# Patient Record
Sex: Female | Born: 1980 | Race: Black or African American | Hispanic: No | Marital: Married | Smoking: Former smoker
Health system: Southern US, Community
[De-identification: ages and names within clinical notes are randomized; demographics above are authoritative.]

## PROBLEM LIST (undated history)

## (undated) DIAGNOSIS — F32A Depression, unspecified: Secondary | ICD-10-CM

## (undated) DIAGNOSIS — L309 Dermatitis, unspecified: Secondary | ICD-10-CM

## (undated) DIAGNOSIS — I2699 Other pulmonary embolism without acute cor pulmonale: Secondary | ICD-10-CM

## (undated) DIAGNOSIS — T783XXA Angioneurotic edema, initial encounter: Secondary | ICD-10-CM

## (undated) DIAGNOSIS — L509 Urticaria, unspecified: Secondary | ICD-10-CM

## (undated) DIAGNOSIS — Z889 Allergy status to unspecified drugs, medicaments and biological substances status: Secondary | ICD-10-CM

## (undated) DIAGNOSIS — R51 Headache: Secondary | ICD-10-CM

## (undated) DIAGNOSIS — I10 Essential (primary) hypertension: Secondary | ICD-10-CM

## (undated) DIAGNOSIS — F319 Bipolar disorder, unspecified: Secondary | ICD-10-CM

## (undated) DIAGNOSIS — D649 Anemia, unspecified: Secondary | ICD-10-CM

## (undated) DIAGNOSIS — R87629 Unspecified abnormal cytological findings in specimens from vagina: Secondary | ICD-10-CM

## (undated) DIAGNOSIS — R519 Headache, unspecified: Secondary | ICD-10-CM

## (undated) DIAGNOSIS — K219 Gastro-esophageal reflux disease without esophagitis: Secondary | ICD-10-CM

## (undated) DIAGNOSIS — F329 Major depressive disorder, single episode, unspecified: Secondary | ICD-10-CM

## (undated) HISTORY — DX: Angioneurotic edema, initial encounter: T78.3XXA

## (undated) HISTORY — DX: Essential (primary) hypertension: I10

## (undated) HISTORY — DX: Headache: R51

## (undated) HISTORY — PX: ABDOMINAL HYSTERECTOMY: SUR658

## (undated) HISTORY — DX: Allergy status to unspecified drugs, medicaments and biological substances: Z88.9

## (undated) HISTORY — PX: BREAST SURGERY: SHX581

## (undated) HISTORY — DX: Headache, unspecified: R51.9

## (undated) HISTORY — DX: Dermatitis, unspecified: L30.9

## (undated) HISTORY — DX: Urticaria, unspecified: L50.9

---

## 2007-07-21 ENCOUNTER — Inpatient Hospital Stay (HOSPITAL_COMMUNITY): Admission: AD | Admit: 2007-07-21 | Discharge: 2007-07-22 | Payer: Self-pay | Admitting: Obstetrics & Gynecology

## 2007-08-12 ENCOUNTER — Emergency Department (HOSPITAL_COMMUNITY): Admission: EM | Admit: 2007-08-12 | Discharge: 2007-08-12 | Payer: Self-pay | Admitting: Emergency Medicine

## 2007-10-08 ENCOUNTER — Inpatient Hospital Stay (HOSPITAL_COMMUNITY): Admission: AD | Admit: 2007-10-08 | Discharge: 2007-10-08 | Payer: Self-pay | Admitting: Obstetrics and Gynecology

## 2007-12-14 ENCOUNTER — Inpatient Hospital Stay: Admission: AD | Admit: 2007-12-14 | Discharge: 2007-12-14 | Payer: Self-pay | Admitting: Obstetrics and Gynecology

## 2008-02-26 ENCOUNTER — Inpatient Hospital Stay (HOSPITAL_COMMUNITY): Admission: AD | Admit: 2008-02-26 | Discharge: 2008-03-01 | Payer: Self-pay | Admitting: Obstetrics and Gynecology

## 2008-02-26 ENCOUNTER — Encounter (INDEPENDENT_AMBULATORY_CARE_PROVIDER_SITE_OTHER): Payer: Self-pay | Admitting: Obstetrics and Gynecology

## 2010-06-25 ENCOUNTER — Encounter: Admission: RE | Admit: 2010-06-25 | Discharge: 2010-06-25 | Payer: Self-pay | Admitting: Family Medicine

## 2010-07-24 ENCOUNTER — Observation Stay (HOSPITAL_COMMUNITY): Admission: EM | Admit: 2010-07-24 | Discharge: 2010-07-25 | Payer: Self-pay | Admitting: Emergency Medicine

## 2010-07-25 ENCOUNTER — Ambulatory Visit: Payer: Self-pay | Admitting: Vascular Surgery

## 2011-02-07 ENCOUNTER — Emergency Department (HOSPITAL_COMMUNITY)
Admission: EM | Admit: 2011-02-07 | Discharge: 2011-02-08 | Disposition: A | Discharge status: Home / Self Care | Payer: 59 | Attending: Emergency Medicine | Admitting: Emergency Medicine

## 2011-02-07 DIAGNOSIS — R079 Chest pain, unspecified: Secondary | ICD-10-CM | POA: Insufficient documentation

## 2011-02-25 LAB — CBC
MCV: 91.5 fL (ref 78.0–100.0)
Platelets: 208 10*3/uL (ref 150–400)
RDW: 14 % (ref 11.5–15.5)

## 2011-02-25 LAB — URINALYSIS, ROUTINE W REFLEX MICROSCOPIC
Bilirubin Urine: NEGATIVE
Glucose, UA: NEGATIVE mg/dL
Hgb urine dipstick: NEGATIVE
Protein, ur: NEGATIVE mg/dL
Specific Gravity, Urine: 1.026 (ref 1.005–1.030)
Urobilinogen, UA: 1 mg/dL (ref 0.0–1.0)

## 2011-02-25 LAB — BASIC METABOLIC PANEL
Calcium: 9.2 mg/dL (ref 8.4–10.5)
Chloride: 107 mEq/L (ref 96–112)
Creatinine, Ser: 0.62 mg/dL (ref 0.4–1.2)
GFR calc Af Amer: >60 mL/min (ref >60)
GFR calc non Af Amer: >60 mL/min (ref >60)
Glucose, Bld: 94 mg/dL (ref 70–99)
Potassium: 3.6 mEq/L (ref 3.5–5.1)

## 2011-02-25 LAB — DIFFERENTIAL
Basophils Relative: 1 % (ref 0–1)
Eosinophils Absolute: 0.3 10*3/uL (ref 0.0–0.7)
Lymphocytes Relative: 39 % (ref 12–46)
Monocytes Absolute: 0.4 10*3/uL (ref 0.1–1.0)
Neutro Abs: 2.5 10*3/uL (ref 1.7–7.7)

## 2011-02-25 LAB — URINE MICROSCOPIC-ADD ON

## 2011-02-25 LAB — POCT CARDIAC MARKERS: Troponin i, poc: <0.05 ng/mL (ref 0.00–0.09)

## 2011-02-25 LAB — CK TOTAL AND CKMB (NOT AT ARMC): CK, MB: 0.5 ng/mL (ref 0.3–4.0)

## 2011-02-25 LAB — TROPONIN I: Troponin I: 0.01 ng/mL (ref 0.00–0.06)

## 2011-02-25 LAB — HEMOGLOBIN A1C: Mean Plasma Glucose: 105 mg/dL (ref <117)

## 2011-04-26 NOTE — Op Note (Signed)
NAME:  Danielle Estes, DONN NO.:  [id number]   MEDICAL RECORD NO.:  [id number]          PATIENT TYPE:  INP   LOCATION:  9134                          FACILITY:  [facility]   PHYSICIAN:  Guy Sandifer. Henderson Cloud, M.D. DATE OF BIRTH:  07-22-1981   DATE OF PROCEDURE:  02/27/2008  DATE OF DISCHARGE:                               OPERATIVE REPORT   PREOPERATIVE DIAGNOSES:  1. Intrauterine pregnancy at 63 and 6/7 weeks estimated additional      age.  2. Fetal distress.   POSTOPERATIVE DIAGNOSES:  1. Intrauterine pregnancy at 28 and 6/7 weeks estimated additional      age.  2. Fetal distress.   PROCEDURE:  Emergent low transverse cesarean section.   SURGEON:  Guy Sandifer. Henderson Cloud, M.D.   ANESTHESIA:  General endotracheal intubation.  Burnett Corrente, M.D.   ESTIMATED BLOOD LOSS:  One thousand mL.   FINDINGS:  Viable female infant with Apgars of 8 and 9 at one and five  minutes respectively.  Arterial cord pH 7.28.  Birth weight 6 pounds 9  ounces.   INDICATIONS AND CONSENT:  This patient is a 30 year old African American  female G3, P2 with an EDC of March 06, 2008, who presented to triage  through the night and was found to have a fetal deceleration that  recovered.  She was admitted for induction of labor in view of the  observed deceleration.  Blood pressure on admission was 140s/90s.  PIH  labs were normal.  Cervix is 3 cm dilated, 50% effaced, -2 station.  Artificial rupture of membranes for clear fluid was carried out at 5  a.m. on February 27, 2008.  Fetal scalp electrode was placed.  The patient  was becoming more uncomfortable.  Blood pressures were 130s-140s/80s-  90s.  The patient denied headache, blurry vision, epigastric pain.  On  examination deep tendon reflexes were 2+ without clonus and she had no  epigastric tenderness.  At approximately 8 a.m. the baby was noted to  have had two decelerations variable in shape with contractions that went  down to the 30s-40s with  recovery.  Pitocin was discontinued, oxygen was  administered.  She was given an IV fluid bolus and changed position.  Cervix was 4 cm dilated, 90% effaced, -2 station at that time.  An IUPC  was placed.  While she was supine for the IUPC placement heartbeat went  down to the 30s again and recovered with rolling the patient to her  side.  At that point I was discussing the deep decelerations with the  patient and the father of the baby.  We discussed our plan of  amnioinfusion and a possible cord compression creating the heart rate  pattern.  During that discussion the patient had another contraction,  the heartbeat went to the 30s-40s again.  At that time I called for an  emergency C-section.  The heartbeat did recover while in the room but we  proceeded with our planned emergency C-section.  I discussed with the  patient and the father of the baby the cesarean section and reviewed the  risks  and benefits.  All questions were answered.   PROCEDURE IN DETAIL:  The patient was taken to the operating room where  the heartbeat was in the 110s.  Dr. Harvest Forest was attempting placement of  the spinal anesthetic.  The patient had a contraction followed by a  deceleration.  At that point we were unable to locate the fetal  heartbeat with the handheld Doppler unit that was being used.  Because  of this the decision was made to proceed with general anesthesia.  The  patient was then prepped, Foley catheter was placed and she was draped.  After general anesthesia was secured with endotracheal intubation a  Pfannenstiel incision was made.  Dissection was carried out in layers to  the peritoneum.  The peritoneum was incised and extended bluntly.  The  vesicouterine peritoneum was taken down cephalad laterally.  The bladder  flap was developed.  The bladder blade was placed.  The uterus was  entered in a low transverse manner and the uterine cavity was entered  bluntly with a hemostat.  The uterine  incision was extended cephalad  laterally with fingers.  The baby was in the left occiput posterior  position.  The vertex was delivered without difficulty.  While  delivering the baby a cord wrapped around the neck and the shoulder was  noted.  The baby was delivered and good cry and tone was noted.  The  cord was clamped and cut and the baby was handed to the awaiting  pediatrics team.  While drawing the cord blood and cord gases the cord  was noted to have very little Wharton's jelly.  The placenta was then  removed and sent to pathology.  The uterus was exteriorized.  The  uterine cavity was cleaned.  The uterus was closed in two running  locking imbricating layers of zero Monocryl suture.  A small bleeder in  the center of the incision was controlled with a 2-0 chromic suture.  The tubes and ovaries were normal bilaterally.  Prior to the procedure  while in the patient's room the patient and I had discussed her request  for tubal ligation.  Due to the emergent nature of the delivery the  decision was made to hold on the tubal ligation at this time.  The  uterus was then returned to the abdomen.  Copious irrigation was carried  out.  Good hemostasis was still noted.  Anterior peritoneum was closed  in running fashion with zero Monocryl suture which was also used to  reapproximate the pyramidalis muscle in the midline.  Anterior rectus  fascia was closed in running fashion with zero PDS suture and the skin  was closed with clips.  All sponge, instrument and needle counts were  correct and the patient was transferred to the recovery room in stable  condition.      Guy Sandifer Henderson Cloud, M.D.  Electronically Signed     JET/MEDQ  D:  02/27/2008  T:  02/27/2008  Job:  [id number]

## 2011-04-29 NOTE — Discharge Summary (Signed)
NAME:  Danielle Estes, BETSCH NO.:  [id number]   MEDICAL RECORD NO.:  [id number]          PATIENT TYPE:  INP   LOCATION:  9134                          FACILITY:  [facility]   PHYSICIAN:  Freddy Finner, M.D.   DATE OF BIRTH:  September 17, 1981   DATE OF ADMISSION:  02/27/2008  DATE OF DISCHARGE:  03/01/2008                               DISCHARGE SUMMARY   ADMITTING DIAGNOSES:  1. Intrauterine pregnancy at 59 weeks estimated gestational age.  2. Induction of labor secondary to nonreassuring fetal heart tones.   DISCHARGE DIAGNOSES:  1. Status post low-transverse cesarean section secondary to      nonreassuring fetal heart tones.  2. Viable female infant.   PROCEDURE:  Primary low-transverse cesarean section.   REASON FOR ADMISSION:  Please see written H&P.   HOSPITAL COURSE:  The patient is a 30 year old gravida 3, para 2 that  presented to [facility] at 39 weeks estimated gestational age for  complaints of contractions.  Upon presentation, no evidence of labor was  observed.  However, the fetal heart tones were nonreactive.  Biophysical  profile was performed, which revealed score of  8/8.  Once the patient  returned from the ultrasound, was placed back on the monitor.  There was  a fetal heart deceleration, which lasted for approximately 30 seconds.  It did have spontaneous recovery and now with reassuring fetal heart  tone pattern with acceleration, the patient was now admitted for an  induction of labor.  Blood pressure was noted to be in the 140s/90s.  Cervix was examined and found to be 3 cm dilated, 50% effaced vertex at  -2 station.  Artificial rupture of membranes was performed, which  revealed clear fluid and fetal scalp electrode was placed.  The patient  was started on some low-dose Pitocin.  PIH labs were drawn.  Shortly,  after starting the Pitocin, the baby was noted to have 2 further  decelerations that were variable in shape.  Pitocin was discontinued.  Oxygen was administered, and the patient was given IV bolus of fluid and  position was changed.  Cervix was reexamined and found now to be 4 cm  dilated, 90% effaced, at -2 station.  Intrauterine pressure catheter was  placed and while the patient was in the supine position for place and  the heartbeat again went down into the 30s which did recover while  rolling the patient back to her side.  Because of the continued deep  decelerations that continued to be observed, a decision was made to  proceed with a cesarean delivery.  The patient was then taken urgently  to the operating room where general anesthesia was administered.  A low-  transverse incision was made with delivery of a viable female infant  weighing 6 pounds 9 ounces, Apgars of 8 at 1 minute and 9 at 5 minutes.  Arterial cord pH was 7.28.  The patient tolerated the procedure well and  was taken to the recovery room in stable condition.  On postoperative  day #1, the patient was without complaint.  Vital signs were stable.  Blood  pressure was now 140/100.  Abdomen was soft with good return of  bowel function.  Fundus was firm and nontender.  Abdominal dressing was  noted to be clean, dry, and intact.  Laboratory findings revealed  hemoglobin of 9.8, platelet count of 118,000.  On the following morning,  the patient denied headache, blurred vision, or right upper quadrant  pain.  Vital signs were stable.  Blood pressure was 120/89 to 132/80.  Abdomen was soft.  Fundus was firm and nontender.  Abdominal dressing  had been removed revealing an incision that was clean, dry and intact.  Laboratory findings revealed hemoglobin stable at 7.8, platelet count of  185,000.  On postoperative day #3, the patient was without complaint.  Vital signs remained stable.  She is afebrile.  Fundus firm and  nontender.  Incision was clean, dry, and intact.  Staples removed, and  the patient was later discharged home.   CONDITION ON DISCHARGE:   Stable.   DIET:  Regular as tolerated.   ACTIVITY:  No heavy lifting, no driving x2 weeks, no vaginal entry.   FOLLOW UP:  The patient is to follow up in the office in 1-2 weeks for  an incision check.  She is to call up for temperature greater than 100  degrees, persistent nausea, vomiting, heavy vaginal bleeding and/or  redness or drainage from incisional site.  The patient was also  encouraged to call for headache, blurred vision, or right upper quadrant  pain.   DISCHARGE MEDICATIONS:  1. Percocet 5/325 #30 1 p.o. q.4-6 h. p.r.n.  2. Motrin 600 mg q.6 h.  3. Prenatal vitamins 1 p.o. daily.  4. Colace 1 p.o. daily p.r.n.      Julio Sicks, N.P.      Freddy Finner, M.D.  Electronically Signed    CC/MEDQ  D:  04/01/2008  T:  04/02/2008  Job:  [id number]

## 2011-07-28 ENCOUNTER — Emergency Department (HOSPITAL_COMMUNITY)
Admission: EM | Admit: 2011-07-28 | Discharge: 2011-07-29 | Disposition: A | Discharge status: Home / Self Care | Payer: 59 | Attending: Emergency Medicine | Admitting: Emergency Medicine

## 2011-07-28 ENCOUNTER — Emergency Department (HOSPITAL_COMMUNITY): Payer: 59

## 2011-07-28 DIAGNOSIS — R071 Chest pain on breathing: Secondary | ICD-10-CM | POA: Insufficient documentation

## 2011-07-28 DIAGNOSIS — R0602 Shortness of breath: Secondary | ICD-10-CM | POA: Insufficient documentation

## 2011-07-28 LAB — CK TOTAL AND CKMB (NOT AT ARMC): Relative Index: 1.1 (ref 0.0–2.5)

## 2011-07-28 LAB — CBC
Hemoglobin: 15 g/dL (ref 12.0–15.0)
MCV: 89.7 fL (ref 78.0–100.0)
Platelets: 223 10*3/uL (ref 150–400)
RBC: 4.84 MIL/uL (ref 3.87–5.11)
WBC: 4.3 10*3/uL (ref 4.0–10.5)

## 2011-07-28 LAB — DIFFERENTIAL
Eosinophils Absolute: 0.4 10*3/uL (ref 0.0–0.7)
Eosinophils Relative: 9 % — ABNORMAL HIGH (ref 0–5)
Lymphocytes Relative: 48 % — ABNORMAL HIGH (ref 12–46)
Lymphs Abs: 2 10*3/uL (ref 0.7–4.0)
Monocytes Absolute: 0.4 10*3/uL (ref 0.1–1.0)

## 2011-07-28 LAB — COMPREHENSIVE METABOLIC PANEL
Albumin: 4.4 g/dL (ref 3.5–5.2)
Alkaline Phosphatase: 80 U/L (ref 39–117)
BUN: 8 mg/dL (ref 6–23)
Calcium: 9.9 mg/dL (ref 8.4–10.5)
Chloride: 105 mEq/L (ref 96–112)
GFR calc Af Amer: >60 mL/min (ref >60)
GFR calc non Af Amer: >60 mL/min (ref >60)
Glucose, Bld: 84 mg/dL (ref 70–99)
Potassium: 3.7 mEq/L (ref 3.5–5.1)
Sodium: 139 mEq/L (ref 135–145)
Total Bilirubin: 0.4 mg/dL (ref 0.3–1.2)

## 2011-07-28 LAB — POCT I-STAT TROPONIN I

## 2011-07-29 LAB — POCT I-STAT TROPONIN I

## 2011-09-01 LAB — URINALYSIS, ROUTINE W REFLEX MICROSCOPIC
Glucose, UA: NEGATIVE
Ketones, ur: NEGATIVE
Nitrite: NEGATIVE
Specific Gravity, Urine: <1.005 — ABNORMAL LOW
pH: 7

## 2011-09-01 LAB — URINE MICROSCOPIC-ADD ON

## 2011-09-05 LAB — CBC
HCT: 24.3 — ABNORMAL LOW
HCT: 28.1 — ABNORMAL LOW
MCHC: 31.9
MCHC: 32.2
MCV: 78.2
MCV: 78.5
Platelets: 204
Platelets: 207
RBC: 3.11 — ABNORMAL LOW
RDW: 17.2 — ABNORMAL HIGH
WBC: 12.8 — ABNORMAL HIGH
WBC: 6.8
WBC: 8.8

## 2011-09-05 LAB — COMPREHENSIVE METABOLIC PANEL
ALT: 11
Albumin: 2.3 — ABNORMAL LOW
Albumin: 2.7 — ABNORMAL LOW
Alkaline Phosphatase: 211 — ABNORMAL HIGH
BUN: 3 — ABNORMAL LOW
CO2: 21
Calcium: 8.5
Calcium: 9.3
Chloride: 109
Creatinine, Ser: 0.48
GFR calc Af Amer: >60
GFR calc Af Amer: >60
GFR calc non Af Amer: >60
Glucose, Bld: 83
Potassium: 4
Sodium: 137
Total Bilirubin: 0.7
Total Protein: 5.4 — ABNORMAL LOW

## 2011-09-05 LAB — DIFFERENTIAL
Lymphocytes Relative: 9 — ABNORMAL LOW
Monocytes Relative: 8
Neutrophils Relative %: 83 — ABNORMAL HIGH

## 2011-09-05 LAB — RPR: RPR Ser Ql: NONREACTIVE

## 2011-09-26 LAB — URINALYSIS, ROUTINE W REFLEX MICROSCOPIC
Bilirubin Urine: NEGATIVE
Glucose, UA: 100 — AB
Ketones, ur: 15 — AB
pH: 6

## 2011-09-26 LAB — URINE CULTURE: Colony Count: >=100000

## 2011-09-26 LAB — CBC
MCV: 91.1
Platelets: 268
WBC: 6.2

## 2011-09-26 LAB — HCG, QUANTITATIVE, PREGNANCY: hCG, Beta Chain, Quant, S: 58870 — ABNORMAL HIGH

## 2011-09-26 LAB — URINE MICROSCOPIC-ADD ON

## 2011-09-26 LAB — POCT PREGNANCY, URINE
Operator id: 134401
Preg Test, Ur: POSITIVE

## 2011-09-26 LAB — WET PREP, GENITAL: Trich, Wet Prep: NONE SEEN

## 2011-09-26 LAB — GC/CHLAMYDIA PROBE AMP, GENITAL: GC Probe Amp, Genital: NEGATIVE

## 2014-02-10 ENCOUNTER — Encounter: Payer: 59 | Admitting: Obstetrics & Gynecology

## 2014-10-13 ENCOUNTER — Other Ambulatory Visit (HOSPITAL_COMMUNITY): Payer: Self-pay | Admitting: Obstetrics

## 2014-10-13 DIAGNOSIS — O09521 Supervision of elderly multigravida, first trimester: Secondary | ICD-10-CM

## 2014-10-13 DIAGNOSIS — O26841 Uterine size-date discrepancy, first trimester: Secondary | ICD-10-CM

## 2014-10-13 DIAGNOSIS — O131 Gestational [pregnancy-induced] hypertension without significant proteinuria, first trimester: Secondary | ICD-10-CM

## 2014-10-13 DIAGNOSIS — Z3A14 14 weeks gestation of pregnancy: Secondary | ICD-10-CM

## 2014-10-13 LAB — OB RESULTS CONSOLE GC/CHLAMYDIA
CHLAMYDIA, DNA PROBE: NEGATIVE
CHLAMYDIA, DNA PROBE: NEGATIVE
Chlamydia: NEGATIVE
Gonorrhea: NEGATIVE
Gonorrhea: NEGATIVE
Gonorrhea: NEGATIVE

## 2014-10-13 LAB — OB RESULTS CONSOLE ANTIBODY SCREEN: ANTIBODY SCREEN: NEGATIVE

## 2014-10-13 LAB — OB RESULTS CONSOLE HEPATITIS B SURFACE ANTIGEN: Hepatitis B Surface Ag: NEGATIVE

## 2014-10-13 LAB — OB RESULTS CONSOLE HGB/HCT, BLOOD
HCT: 39 %
Hemoglobin: 13.2 g/dL

## 2014-10-13 LAB — OB RESULTS CONSOLE ABO/RH: RH Type: POSITIVE

## 2014-10-13 LAB — OB RESULTS CONSOLE HIV ANTIBODY (ROUTINE TESTING): HIV: NONREACTIVE

## 2014-10-13 LAB — OB RESULTS CONSOLE RUBELLA ANTIBODY, IGM: Rubella: IMMUNE

## 2014-10-13 LAB — OB RESULTS CONSOLE PLATELET COUNT: PLATELETS: 289 10*3/uL

## 2014-10-13 LAB — OB RESULTS CONSOLE RPR: RPR: NONREACTIVE

## 2014-10-13 LAB — SICKLE CELL SCREEN: Sickle Cell Screen: NEGATIVE

## 2014-10-15 ENCOUNTER — Ambulatory Visit (HOSPITAL_COMMUNITY)
Admission: RE | Admit: 2014-10-15 | Discharge: 2014-10-15 | Disposition: A | Discharge status: Home / Self Care | Payer: Medicaid Other | Source: Ambulatory Visit | Attending: Obstetrics | Admitting: Obstetrics

## 2014-10-15 ENCOUNTER — Other Ambulatory Visit (HOSPITAL_COMMUNITY): Payer: Self-pay | Admitting: Obstetrics

## 2014-10-15 ENCOUNTER — Encounter (HOSPITAL_COMMUNITY): Payer: Self-pay

## 2014-10-15 DIAGNOSIS — Z3682 Encounter for antenatal screening for nuchal translucency: Secondary | ICD-10-CM | POA: Insufficient documentation

## 2014-10-15 DIAGNOSIS — Z315 Encounter for genetic counseling: Secondary | ICD-10-CM | POA: Insufficient documentation

## 2014-10-15 DIAGNOSIS — Z3A12 12 weeks gestation of pregnancy: Secondary | ICD-10-CM | POA: Insufficient documentation

## 2014-10-15 DIAGNOSIS — O131 Gestational [pregnancy-induced] hypertension without significant proteinuria, first trimester: Secondary | ICD-10-CM

## 2014-10-15 DIAGNOSIS — Z3A14 14 weeks gestation of pregnancy: Secondary | ICD-10-CM

## 2014-10-15 DIAGNOSIS — O09521 Supervision of elderly multigravida, first trimester: Secondary | ICD-10-CM

## 2014-10-15 DIAGNOSIS — O09529 Supervision of elderly multigravida, unspecified trimester: Secondary | ICD-10-CM | POA: Insufficient documentation

## 2014-10-15 DIAGNOSIS — O26841 Uterine size-date discrepancy, first trimester: Secondary | ICD-10-CM

## 2014-10-15 DIAGNOSIS — O26849 Uterine size-date discrepancy, unspecified trimester: Secondary | ICD-10-CM | POA: Insufficient documentation

## 2014-10-15 NOTE — Progress Notes (Signed)
Appointment Date: 10/15/2014 DOB: 05-14-1981 Referring Provider: Kathreen CosierMarshall, Bernard A, MD Attending: Dr. Particia NearingMartha Decker  Mrs. Kendel D Grzesiak and her husband, Riley Lamdrick Gratz, were seen for genetic counseling because of a maternal age of 33 y.o.Marland Kitchen.     They were counseled regarding maternal age and the association with risk for chromosome conditions due to nondisjunction.   We reviewed chromosomes, nondisjunction, and the associated 1 in 114 risk for fetal aneuploidy related to a maternal age of 33 y.o. at 6912 weeks gestation gestation.  They were counseled that the risk for aneuploidy decreases as gestational age increases, accounting for those pregnancies which spontaneously abort.  We specifically discussed Down syndrome (trisomy 6121), trisomies 3113 and 118, and sex chromosome aneuploidies (47,XXX and 47,XXY) including the common features and prognoses of each.   We reviewed available screening options including First Screen, noninvasive prenatal screening (NIPS)/cell free DNA (cfDNA) testing, and detailed ultrasound.  They were counseled that screening tests are used to modify a patient's a priori risk for aneuploidy, typically based on age. This estimate provides a pregnancy specific risk assessment. We reviewed the benefits and limitations of each option. Specifically, we discussed the conditions for which each test screens, the detection rates, and false positive rates of each. They were also counseled regarding diagnostic testing via amniocentesis. We reviewed the approximate 1 in 300-500 risk for complications from amniocentesis, including spontaneous pregnancy loss. After consideration of all the options, they elected to proceed with NIPS.  Those results will be available in 8-10 days.    Mrs. Sisneros was provided with written information regarding sickle cell anemia (SCA) including the carrier frequency and incidence in the African-American population, the availability of carrier testing and prenatal  diagnosis if indicated.  In addition, we discussed that hemoglobinopathies are routinely screened for as part of the [place] newborn screening panel.  She elected hemoglobin electrophoresis today.  Both family histories were reviewed and found to be noncontributory for birth defects, intellectual disability, and known genetic conditions. Without further information regarding the provided family history, an accurate genetic risk cannot be calculated. Further genetic counseling is warranted if more information is obtained.  Mrs. Seevers denied exposure to environmental toxins or chemical agents. She denied the use of alcohol, tobacco or street drugs. She denied significant viral illnesses during the course of her pregnancy. Her medical and surgical histories were noncontributory.   I counseled this couple regarding the above risks and available options.  The approximate face-to-face time with the genetic counselor was 60 minutes.  Mady Gemmaaragh Kolden Dupee, MS,  Certified Genetic Counselor

## 2014-10-17 LAB — HEMOGLOBINOPATHY EVALUATION
HEMOGLOBIN OTHER: 0 %
HGB A2 QUANT: 2.4 % (ref 2.2–3.2)
Hgb A: 97.6 % (ref 96.8–97.8)
Hgb F Quant: 0 % (ref 0.0–2.0)
Hgb S Quant: 0 %

## 2014-10-23 ENCOUNTER — Other Ambulatory Visit (HOSPITAL_COMMUNITY): Payer: Self-pay

## 2014-10-23 ENCOUNTER — Telehealth (HOSPITAL_COMMUNITY): Payer: Self-pay | Admitting: MS"

## 2014-10-23 NOTE — Telephone Encounter (Signed)
Called Danielle Estes to discuss her cell free fetal DNA test results.  [name] had Panorama testing through [facility].  Testing was offered because of maternal age.   The patient was identified by name and DOB.  We reviewed that these are within normal limits, showing a less than 1 in 10,000 risk for trisomies 21, 18 and 13, and monosomy X (Turner syndrome).  In addition, the risk for triploidy/vanishing twin and sex chromosome trisomies (47,XXX and 47,XXY) was also low risk.  We reviewed that this testing identifies > 99% of pregnancies with trisomy 3221, trisomy 6113, sex chromosome trisomies (47,XXX and 47,XXY), and triploidy. The detection rate for trisomy 18 is 96%.  The detection rate for monosomy X is ~92%.  The false positive rate is <0.1% for all conditions. Testing was also consistent with female fetal sex.  The patient did wish to know fetal sex.  She understands that this testing does not identify all genetic conditions.   Additionally, hemoglobin electrophoresis was offered to the patient to screen for sickle cell trait and other hemoglobin variants. We discussed that this screen was within normal limits, indicating the presence of normal adult hemoglobin. Thus, she does not appear to have sickle cell trait or other hemoglobin variants.  All questions were answered to her satisfaction, she was encouraged to call with additional questions or concerns.  Quinn PlowmanKaren Beaumont Austad, MS Certified Genetic Counselor 10/23/2014 4:39 PM

## 2014-11-21 ENCOUNTER — Encounter (HOSPITAL_COMMUNITY): Payer: Self-pay

## 2014-11-21 ENCOUNTER — Ambulatory Visit (HOSPITAL_COMMUNITY)
Admission: RE | Admit: 2014-11-21 | Discharge: 2014-11-21 | Disposition: A | Discharge status: Home / Self Care | Payer: Medicaid Other | Source: Ambulatory Visit | Attending: Obstetrics | Admitting: Obstetrics

## 2014-11-21 VITALS — BP 127/82 | HR 94 | Wt 234.0 lb

## 2014-11-21 DIAGNOSIS — Z3A17 17 weeks gestation of pregnancy: Secondary | ICD-10-CM | POA: Diagnosis not present

## 2014-11-21 DIAGNOSIS — Z36 Encounter for antenatal screening of mother: Secondary | ICD-10-CM | POA: Diagnosis not present

## 2014-11-21 DIAGNOSIS — O10012 Pre-existing essential hypertension complicating pregnancy, second trimester: Secondary | ICD-10-CM | POA: Insufficient documentation

## 2014-11-21 DIAGNOSIS — Z3689 Encounter for other specified antenatal screening: Secondary | ICD-10-CM | POA: Insufficient documentation

## 2014-11-21 DIAGNOSIS — O09521 Supervision of elderly multigravida, first trimester: Secondary | ICD-10-CM

## 2014-11-21 DIAGNOSIS — O09522 Supervision of elderly multigravida, second trimester: Secondary | ICD-10-CM | POA: Diagnosis not present

## 2014-11-21 DIAGNOSIS — Z3A14 14 weeks gestation of pregnancy: Secondary | ICD-10-CM

## 2014-11-21 DIAGNOSIS — IMO0002 Reserved for concepts with insufficient information to code with codable children: Secondary | ICD-10-CM

## 2014-11-21 DIAGNOSIS — O26841 Uterine size-date discrepancy, first trimester: Secondary | ICD-10-CM

## 2014-11-21 DIAGNOSIS — O131 Gestational [pregnancy-induced] hypertension without significant proteinuria, first trimester: Secondary | ICD-10-CM

## 2014-11-21 DIAGNOSIS — Z0489 Encounter for examination and observation for other specified reasons: Secondary | ICD-10-CM

## 2014-12-12 HISTORY — PX: TUBAL LIGATION: SHX77

## 2014-12-19 ENCOUNTER — Ambulatory Visit (HOSPITAL_COMMUNITY)
Admission: RE | Admit: 2014-12-19 | Discharge: 2014-12-19 | Disposition: A | Discharge status: Home / Self Care | Payer: Medicaid Other | Source: Ambulatory Visit | Attending: Obstetrics | Admitting: Obstetrics

## 2014-12-19 ENCOUNTER — Encounter (HOSPITAL_COMMUNITY): Payer: Self-pay

## 2014-12-19 VITALS — BP 125/81 | HR 101 | Wt 238.5 lb

## 2014-12-19 DIAGNOSIS — O10019 Pre-existing essential hypertension complicating pregnancy, unspecified trimester: Secondary | ICD-10-CM | POA: Insufficient documentation

## 2014-12-19 DIAGNOSIS — Z36 Encounter for antenatal screening of mother: Secondary | ICD-10-CM | POA: Diagnosis present

## 2014-12-19 DIAGNOSIS — Z0489 Encounter for examination and observation for other specified reasons: Secondary | ICD-10-CM

## 2014-12-19 DIAGNOSIS — Z3A21 21 weeks gestation of pregnancy: Secondary | ICD-10-CM | POA: Insufficient documentation

## 2014-12-19 DIAGNOSIS — IMO0002 Reserved for concepts with insufficient information to code with codable children: Secondary | ICD-10-CM

## 2015-01-07 ENCOUNTER — Encounter: Payer: Self-pay | Admitting: Physician Assistant

## 2015-01-07 ENCOUNTER — Ambulatory Visit (INDEPENDENT_AMBULATORY_CARE_PROVIDER_SITE_OTHER): Payer: Self-pay | Admitting: Physician Assistant

## 2015-01-07 VITALS — BP 127/93 | HR 93 | Ht 72.0 in | Wt 238.3 lb

## 2015-01-07 DIAGNOSIS — O0992 Supervision of high risk pregnancy, unspecified, second trimester: Secondary | ICD-10-CM

## 2015-01-07 DIAGNOSIS — O0993 Supervision of high risk pregnancy, unspecified, third trimester: Secondary | ICD-10-CM | POA: Insufficient documentation

## 2015-01-07 DIAGNOSIS — F3160 Bipolar disorder, current episode mixed, unspecified: Secondary | ICD-10-CM

## 2015-01-07 DIAGNOSIS — F319 Bipolar disorder, unspecified: Secondary | ICD-10-CM | POA: Insufficient documentation

## 2015-01-07 DIAGNOSIS — Z98891 History of uterine scar from previous surgery: Secondary | ICD-10-CM

## 2015-01-07 DIAGNOSIS — Z9889 Other specified postprocedural states: Secondary | ICD-10-CM

## 2015-01-07 LAB — POCT URINALYSIS DIP (DEVICE)
Glucose, UA: NEGATIVE mg/dL
HGB URINE DIPSTICK: NEGATIVE
Ketones, ur: NEGATIVE mg/dL
Leukocytes, UA: NEGATIVE
NITRITE: NEGATIVE
PH: 6 (ref 5.0–8.0)
PROTEIN: 30 mg/dL — AB
Specific Gravity, Urine: >=1.03 (ref 1.005–1.030)
Urobilinogen, UA: 0.2 mg/dL (ref 0.0–1.0)

## 2015-01-07 NOTE — Progress Notes (Signed)
Subjective:    Danielle Estes is a [id number] 8239w3d being seen today for her first obstetrical visit at this practice.  She was previously seen with Dr. Gaynell Estes but needed to make a change as she is re-instating her [employer] insurance (which is not accepted at his office).  Her obstetrical history is significant for advanced maternal age and hypertension, bipolar disorder. Patient does intend to breast feed. Pregnancy history fully reviewed.  Patient reports no complaints.  Pt takes BP meds at noon to avoid vomiting (with morning sickness).    Filed Vitals:   01/07/15 0854 01/07/15 0856  BP: 127/93   Pulse: 93   Height:  6' (1.829 m)  Weight: 238 lb 4.8 oz (108.092 kg)     HISTORY: OB History  Gravida Para Term Preterm AB SAB TAB Ectopic Multiple Living  4 3 3  0 0 0 0 0 0 6    # Outcome Date GA Lbr Len/2nd Weight Sex Delivery Anes PTL Lv  4 Current           3 Term 02/27/08 4721w0d   F CS-Unspec   Y  2 Term 01/03/06 4421w0d   F Vag-Spont  N Y  1 Term 06/16/02 4921w0d   Danielle MunroeM Vag-Spont  N Y     Past Medical History  Diagnosis Date  . Headache   . Hypertension   . Multiple allergies    Past Surgical History  Procedure Laterality Date  . Cesarean section    . Breast surgery      had lumped removed at age 34. non cancer   Family History  Problem Relation Age of Onset  . Hypertension Maternal Aunt   . Cancer Maternal Aunt   . Heart disease Maternal Grandmother      Exam    Uterus:   gravid to 25 cm  Pelvic Exam:                               System:     Skin: normal coloration and turgor, no rashes    Neurologic: oriented, normal mood   Extremities: normal strength, tone, and muscle mass   HEENT extra ocular movement intact and neck supple with midline trachea   Mouth/Teeth mucous membranes moist, pharynx normal without lesions and dental hygiene good   Neck supple and no masses   Cardiovascular: regular rate and rhythm   Respiratory:  appears well, vitals normal, no  respiratory distress, acyanotic, normal RR, ear and throat exam is normal, neck free of mass or lymphadenopathy, chest clear, no wheezing, crepitations, rhonchi, normal symmetric air entry   Abdomen: soft, non-tender; bowel sounds normal; no masses,  no organomegaly   Urinary:       Assessment:    Pregnancy: [id number] Patient Active Problem List   Diagnosis Date Noted  . Supervision of high risk pregnancy in second trimester 01/07/2015  . Bipolar disorder 01/07/2015  . Essential hypertension antepartum   . [redacted] weeks gestation of pregnancy   . Advanced maternal age in multigravida   . Encounter for fetal anatomic survey   . [redacted] weeks gestation of pregnancy   . Multigravida of advanced maternal age in first trimester 10/15/2014  . AMA (advanced maternal age) multigravida 35+   . Uterine size date discrepancy pregnancy   . [redacted] weeks gestation of pregnancy   . Encounter for (NT) nuchal translucency scan     h/o csection  Plan:    Continue Labetalol  bid.  Pt has sufficient rx.   Monitor BP.  Pt has not taken med in >16 hours when it was taken in clinic today.  She is encouraged to take routinely.   Begin care with mental health at [facility]. Prenatal vitamins. Problem list reviewed and updated. Genetic Screening discussed and done  Ultrasound discussed; fetal survey: MFM referral complete.  Pt has u/s scheduled 2/4.  Follow up in 2 weeks. 75% of 30 min visit spent on counseling and coordination of care.     Danielle Estes, Danielle Estes 01/07/2015

## 2015-01-07 NOTE — Patient Instructions (Signed)
Second Trimester of Pregnancy The second trimester is from week 13 through week 28, months 4 through 6. The second trimester is often a time when you feel your best. Your body has also adjusted to being pregnant, and you begin to feel better physically. Usually, morning sickness has lessened or quit completely, you may have more energy, and you may have an increase in appetite. The second trimester is also a time when the fetus is growing rapidly. At the end of the sixth month, the fetus is about 9 inches long and weighs about 1 pounds. You will likely begin to feel the baby move (quickening) between 18 and 20 weeks of the pregnancy. BODY CHANGES Your body goes through many changes during pregnancy. The changes vary from woman to woman.   Your weight will continue to increase. You will notice your lower abdomen bulging out.  You may begin to get stretch marks on your hips, abdomen, and breasts.  You may develop headaches that can be relieved by medicines approved by your health care provider.  You may urinate more often because the fetus is pressing on your bladder.  You may develop or continue to have heartburn as a result of your pregnancy.  You may develop constipation because certain hormones are causing the muscles that push waste through your intestines to slow down.  You may develop hemorrhoids or swollen, bulging veins (varicose veins).  You may have back pain because of the weight gain and pregnancy hormones relaxing your joints between the bones in your pelvis and as a result of a shift in weight and the muscles that support your balance.  Your breasts will continue to grow and be tender.  Your gums may bleed and may be sensitive to brushing and flossing.  Dark spots or blotches (chloasma, mask of pregnancy) may develop on your face. This will likely fade after the baby is born.  A dark line from your belly button to the pubic area (linea nigra) may appear. This will likely fade  after the baby is born.  You may have changes in your hair. These can include thickening of your hair, rapid growth, and changes in texture. Some women also have hair loss during or after pregnancy, or hair that feels dry or thin. Your hair will most likely return to normal after your baby is born. WHAT TO EXPECT AT YOUR PRENATAL VISITS During a routine prenatal visit:  You will be weighed to make sure you and the fetus are growing normally.  Your blood pressure will be taken.  Your abdomen will be measured to track your baby's growth.  The fetal heartbeat will be listened to.  Any test results from the previous visit will be discussed. Your health care provider may ask you:  How you are feeling.  If you are feeling the baby move.  If you have had any abnormal symptoms, such as leaking fluid, bleeding, severe headaches, or abdominal cramping.  If you have any questions. Other tests that may be performed during your second trimester include:  Blood tests that check for:  Low iron levels (anemia).  Gestational diabetes (between 24 and 28 weeks).  Rh antibodies.  Urine tests to check for infections, diabetes, or protein in the urine.  An ultrasound to confirm the proper growth and development of the baby.  An amniocentesis to check for possible genetic problems.  Fetal screens for spina bifida and Down syndrome. HOME CARE INSTRUCTIONS   Avoid all smoking, herbs, alcohol, and unprescribed   drugs. These chemicals affect the formation and growth of the baby.  Follow your health care provider's instructions regarding medicine use. There are medicines that are either safe or unsafe to take during pregnancy.  Exercise only as directed by your health care provider. Experiencing uterine cramps is a good sign to stop exercising.  Continue to eat regular, healthy meals.  Wear a good support bra for breast tenderness.  Do not use hot tubs, steam rooms, or saunas.  Wear your  seat belt at all times when driving.  Avoid raw meat, uncooked cheese, cat litter boxes, and soil used by cats. These carry germs that can cause birth defects in the baby.  Take your prenatal vitamins.  Try taking a stool softener (if your health care provider approves) if you develop constipation. Eat more high-fiber foods, such as fresh vegetables or fruit and whole grains. Drink plenty of fluids to keep your urine clear or pale yellow.  Take warm sitz baths to soothe any pain or discomfort caused by hemorrhoids. Use hemorrhoid cream if your health care provider approves.  If you develop varicose veins, wear support hose. Elevate your feet for 15 minutes, 3-4 times a day. Limit salt in your diet.  Avoid heavy lifting, wear low heel shoes, and practice good posture.  Rest with your legs elevated if you have leg cramps or low back pain.  Visit your dentist if you have not gone yet during your pregnancy. Use a soft toothbrush to brush your teeth and be gentle when you floss.  A sexual relationship may be continued unless your health care provider directs you otherwise.  Continue to go to all your prenatal visits as directed by your health care provider. SEEK MEDICAL CARE IF:   You have dizziness.  You have mild pelvic cramps, pelvic pressure, or nagging pain in the abdominal area.  You have persistent nausea, vomiting, or diarrhea.  You have a bad smelling vaginal discharge.  You have pain with urination. SEEK IMMEDIATE MEDICAL CARE IF:   You have a fever.  You are leaking fluid from your vagina.  You have spotting or bleeding from your vagina.  You have severe abdominal cramping or pain.  You have rapid weight gain or loss.  You have shortness of breath with chest pain.  You notice sudden or extreme swelling of your face, hands, ankles, feet, or legs.  You have not felt your baby move in over an hour.  You have severe headaches that do not go away with  medicine.  You have vision changes. Document Released: 11/22/2001 Document Revised: 12/03/2013 Document Reviewed: 01/29/2013 [employer] Patient Information 2015 [employer]. This information is not intended to replace advice given to you by your health care provider. Make sure you discuss any questions you have with your health care provider.  

## 2015-01-07 NOTE — Progress Notes (Signed)
Pt was seeing Dr. Gaynell FaceMarshall prior to this visit. She only missed 1 appointment there.

## 2015-01-15 ENCOUNTER — Encounter (HOSPITAL_COMMUNITY): Payer: Self-pay

## 2015-01-15 ENCOUNTER — Ambulatory Visit (HOSPITAL_COMMUNITY)
Admission: RE | Admit: 2015-01-15 | Discharge: 2015-01-15 | Disposition: A | Discharge status: Home / Self Care | Payer: Medicaid Other | Source: Ambulatory Visit | Attending: Obstetrics | Admitting: Obstetrics

## 2015-01-15 DIAGNOSIS — IMO0002 Reserved for concepts with insufficient information to code with codable children: Secondary | ICD-10-CM

## 2015-01-15 DIAGNOSIS — O10012 Pre-existing essential hypertension complicating pregnancy, second trimester: Secondary | ICD-10-CM | POA: Diagnosis present

## 2015-01-15 DIAGNOSIS — O09522 Supervision of elderly multigravida, second trimester: Secondary | ICD-10-CM | POA: Insufficient documentation

## 2015-01-15 DIAGNOSIS — Z3A25 25 weeks gestation of pregnancy: Secondary | ICD-10-CM | POA: Insufficient documentation

## 2015-01-15 DIAGNOSIS — Z0489 Encounter for examination and observation for other specified reasons: Secondary | ICD-10-CM

## 2015-01-15 DIAGNOSIS — O283 Abnormal ultrasonic finding on antenatal screening of mother: Secondary | ICD-10-CM | POA: Insufficient documentation

## 2015-01-16 ENCOUNTER — Other Ambulatory Visit (HOSPITAL_COMMUNITY): Payer: Self-pay | Admitting: Maternal and Fetal Medicine

## 2015-01-16 DIAGNOSIS — O365921 Maternal care for other known or suspected poor fetal growth, second trimester, fetus 1: Secondary | ICD-10-CM

## 2015-01-16 DIAGNOSIS — O09522 Supervision of elderly multigravida, second trimester: Secondary | ICD-10-CM

## 2015-01-16 DIAGNOSIS — O10912 Unspecified pre-existing hypertension complicating pregnancy, second trimester: Secondary | ICD-10-CM

## 2015-01-21 ENCOUNTER — Ambulatory Visit (INDEPENDENT_AMBULATORY_CARE_PROVIDER_SITE_OTHER): Payer: Non-veteran care | Admitting: Advanced Practice Midwife

## 2015-01-21 VITALS — BP 115/80 | HR 82 | Temp 97.4°F | Wt 245.4 lb

## 2015-01-21 DIAGNOSIS — O162 Unspecified maternal hypertension, second trimester: Secondary | ICD-10-CM

## 2015-01-21 DIAGNOSIS — Z98891 History of uterine scar from previous surgery: Secondary | ICD-10-CM

## 2015-01-21 DIAGNOSIS — Z9889 Other specified postprocedural states: Secondary | ICD-10-CM

## 2015-01-21 DIAGNOSIS — O289 Unspecified abnormal findings on antenatal screening of mother: Secondary | ICD-10-CM

## 2015-01-21 DIAGNOSIS — Z23 Encounter for immunization: Secondary | ICD-10-CM

## 2015-01-21 DIAGNOSIS — O0992 Supervision of high risk pregnancy, unspecified, second trimester: Secondary | ICD-10-CM

## 2015-01-21 DIAGNOSIS — O10012 Pre-existing essential hypertension complicating pregnancy, second trimester: Secondary | ICD-10-CM

## 2015-01-21 DIAGNOSIS — O283 Abnormal ultrasonic finding on antenatal screening of mother: Secondary | ICD-10-CM

## 2015-01-21 LAB — CBC
HCT: 35.7 % — ABNORMAL LOW (ref 36.0–46.0)
Hemoglobin: 11.5 g/dL — ABNORMAL LOW (ref 12.0–15.0)
MCH: 29.9 pg (ref 26.0–34.0)
MCHC: 32.2 g/dL (ref 30.0–36.0)
MCV: 92.7 fL (ref 78.0–100.0)
MPV: 11.3 fL (ref 8.6–12.4)
PLATELETS: 261 10*3/uL (ref 150–400)
RBC: 3.85 MIL/uL — AB (ref 3.87–5.11)
RDW: 16.3 % — ABNORMAL HIGH (ref 11.5–15.5)
WBC: 6.8 10*3/uL (ref 4.0–10.5)

## 2015-01-21 LAB — POCT URINALYSIS DIP (DEVICE)
Bilirubin Urine: NEGATIVE
Glucose, UA: NEGATIVE mg/dL
HGB URINE DIPSTICK: NEGATIVE
KETONES UR: NEGATIVE mg/dL
Leukocytes, UA: NEGATIVE
Nitrite: NEGATIVE
Protein, ur: NEGATIVE mg/dL
UROBILINOGEN UA: 0.2 mg/dL (ref 0.0–1.0)
pH: 6 (ref 5.0–8.0)

## 2015-01-21 LAB — OB RESULTS CONSOLE HIV ANTIBODY (ROUTINE TESTING): HIV: NONREACTIVE

## 2015-01-21 MED ORDER — TETANUS-DIPHTH-ACELL PERTUSSIS 5-2.5-18.5 LF-MCG/0.5 IM SUSP
0.5000 mL | Freq: Once | INTRAMUSCULAR | Status: AC
  Administered 2015-01-21: 0.5 mL via INTRAMUSCULAR

## 2015-01-21 NOTE — Progress Notes (Signed)
C/o occasional, mild lower back pain.  1hr gtt and 28 week labs today. Tdap today.

## 2015-01-21 NOTE — Progress Notes (Signed)
Taking Labetalol 200 mg BID.  28 week labs today. 24 hour urine started. Growth US normal. F/U 02/12/15. TDaP No Flu vaccine due to egg allergy No ASA due to ASA allergy TOLAC consent signed

## 2015-01-21 NOTE — Patient Instructions (Signed)

## 2015-01-22 LAB — GLUCOSE TOLERANCE, 1 HOUR (50G) W/O FASTING: GLUCOSE 1 HOUR GTT: 111 mg/dL (ref 70–140)

## 2015-01-22 LAB — RPR

## 2015-01-22 LAB — HIV ANTIBODY (ROUTINE TESTING W REFLEX): HIV 1&2 Ab, 4th Generation: NONREACTIVE

## 2015-01-23 ENCOUNTER — Other Ambulatory Visit: Payer: Non-veteran care

## 2015-01-23 DIAGNOSIS — O169 Unspecified maternal hypertension, unspecified trimester: Secondary | ICD-10-CM

## 2015-01-23 LAB — COMPREHENSIVE METABOLIC PANEL
ALBUMIN: 3.2 g/dL — AB (ref 3.5–5.2)
ALK PHOS: 55 U/L (ref 39–117)
ALT: 18 U/L (ref 0–35)
AST: 15 U/L (ref 0–37)
BUN: 5 mg/dL — ABNORMAL LOW (ref 6–23)
CHLORIDE: 109 meq/L (ref 96–112)
CO2: 21 meq/L (ref 19–32)
Calcium: 8.8 mg/dL (ref 8.4–10.5)
Creat: 0.46 mg/dL — ABNORMAL LOW (ref 0.50–1.10)
GLUCOSE: 87 mg/dL (ref 70–99)
POTASSIUM: 3.9 meq/L (ref 3.5–5.3)
Sodium: 138 mEq/L (ref 135–145)
TOTAL PROTEIN: 5.9 g/dL — AB (ref 6.0–8.3)
Total Bilirubin: 0.3 mg/dL (ref 0.2–1.2)

## 2015-01-23 LAB — PROTEIN, URINE, 24 HOUR
PROTEIN 24H UR: 254 mg/d — AB (ref <150)
Protein, Urine: 26 mg/dL — ABNORMAL HIGH (ref 5–24)

## 2015-02-05 ENCOUNTER — Encounter: Payer: Self-pay | Admitting: Obstetrics and Gynecology

## 2015-02-05 ENCOUNTER — Ambulatory Visit (INDEPENDENT_AMBULATORY_CARE_PROVIDER_SITE_OTHER): Payer: Non-veteran care | Admitting: Obstetrics and Gynecology

## 2015-02-05 VITALS — BP 123/81 | HR 89 | Temp 98.0°F | Wt 246.1 lb

## 2015-02-05 DIAGNOSIS — O0992 Supervision of high risk pregnancy, unspecified, second trimester: Secondary | ICD-10-CM

## 2015-02-05 DIAGNOSIS — F3160 Bipolar disorder, current episode mixed, unspecified: Secondary | ICD-10-CM

## 2015-02-05 DIAGNOSIS — Z9889 Other specified postprocedural states: Secondary | ICD-10-CM

## 2015-02-05 DIAGNOSIS — O09523 Supervision of elderly multigravida, third trimester: Secondary | ICD-10-CM

## 2015-02-05 DIAGNOSIS — O09521 Supervision of elderly multigravida, first trimester: Secondary | ICD-10-CM

## 2015-02-05 DIAGNOSIS — Z98891 History of uterine scar from previous surgery: Secondary | ICD-10-CM

## 2015-02-05 LAB — POCT URINALYSIS DIP (DEVICE)
Glucose, UA: NEGATIVE mg/dL
Hgb urine dipstick: NEGATIVE
Ketones, ur: 80 mg/dL — AB
Nitrite: NEGATIVE
Protein, ur: 30 mg/dL — AB
Specific Gravity, Urine: >=1.03 (ref 1.005–1.030)
Urobilinogen, UA: 1 mg/dL (ref 0.0–1.0)
pH: 6.5 (ref 5.0–8.0)

## 2015-02-05 NOTE — Progress Notes (Signed)
Patient is doing well without complaints. FM/PTL precautions reviewed. Continue labetalol 200 mg BID Follow up growth ultrasound on 3/3 Patient desires BTL- consent form signed today

## 2015-02-11 ENCOUNTER — Telehealth: Payer: Self-pay

## 2015-02-11 ENCOUNTER — Encounter: Payer: Self-pay | Admitting: *Deleted

## 2015-02-11 NOTE — Telephone Encounter (Signed)
Patient called c/o vomiting several times since this morning and being unable to keep anything down. Denies any fever or diarrhea. Reports baby moving well. Informed patient she could be experiencing a virus-- advised she try to stay well hydrated, encouraged her to drink lots of water and eat small bland meals (crackers, chicken and rice). Advised she continue to monitor baby and ensure he is moving several times throughout the day. Advised that if symptoms persist or get worse she go to urgent care. Patient verbalized understanding and gratitude. No questions or concerns.

## 2015-02-12 ENCOUNTER — Ambulatory Visit (HOSPITAL_COMMUNITY)
Admission: RE | Admit: 2015-02-12 | Discharge: 2015-02-12 | Disposition: A | Discharge status: Home / Self Care | Payer: Medicaid Other | Source: Ambulatory Visit | Attending: Obstetrics | Admitting: Obstetrics

## 2015-02-12 ENCOUNTER — Other Ambulatory Visit (HOSPITAL_COMMUNITY): Payer: Self-pay | Admitting: Obstetrics and Gynecology

## 2015-02-12 ENCOUNTER — Ambulatory Visit (HOSPITAL_COMMUNITY)
Admission: RE | Admit: 2015-02-12 | Discharge: 2015-02-12 | Disposition: A | Discharge status: Home / Self Care | Payer: Medicaid Other | Source: Ambulatory Visit | Attending: Family Medicine | Admitting: Family Medicine

## 2015-02-12 DIAGNOSIS — O09522 Supervision of elderly multigravida, second trimester: Secondary | ICD-10-CM

## 2015-02-12 DIAGNOSIS — O283 Abnormal ultrasonic finding on antenatal screening of mother: Secondary | ICD-10-CM | POA: Diagnosis not present

## 2015-02-12 DIAGNOSIS — Q031 Atresia of foramina of Magendie and Luschka: Secondary | ICD-10-CM | POA: Insufficient documentation

## 2015-02-12 DIAGNOSIS — O365921 Maternal care for other known or suspected poor fetal growth, second trimester, fetus 1: Secondary | ICD-10-CM

## 2015-02-12 DIAGNOSIS — O350XX0 Maternal care for (suspected) central nervous system malformation in fetus, not applicable or unspecified: Secondary | ICD-10-CM

## 2015-02-12 DIAGNOSIS — O09523 Supervision of elderly multigravida, third trimester: Secondary | ICD-10-CM

## 2015-02-12 DIAGNOSIS — Z3A29 29 weeks gestation of pregnancy: Secondary | ICD-10-CM | POA: Diagnosis not present

## 2015-02-12 DIAGNOSIS — O3509X0 Maternal care for (suspected) other central nervous system malformation or damage in fetus, not applicable or unspecified: Secondary | ICD-10-CM

## 2015-02-12 DIAGNOSIS — O10912 Unspecified pre-existing hypertension complicating pregnancy, second trimester: Secondary | ICD-10-CM

## 2015-02-12 DIAGNOSIS — O365990 Maternal care for other known or suspected poor fetal growth, unspecified trimester, not applicable or unspecified: Secondary | ICD-10-CM | POA: Insufficient documentation

## 2015-02-12 DIAGNOSIS — O10919 Unspecified pre-existing hypertension complicating pregnancy, unspecified trimester: Secondary | ICD-10-CM | POA: Insufficient documentation

## 2015-02-12 DIAGNOSIS — O3500X0 Maternal care for (suspected) central nervous system malformation or damage in fetus, unspecified, not applicable or unspecified: Secondary | ICD-10-CM

## 2015-02-12 DIAGNOSIS — Z315 Encounter for genetic counseling: Secondary | ICD-10-CM | POA: Insufficient documentation

## 2015-02-12 NOTE — Progress Notes (Signed)
Genetic Counseling  High-Risk Gestation Note  Appointment Date:  02/12/2015 Referred By: Reva BoresPratt, Danielle S, MD Date of Birth:  Nov 21, 1981 Partner:  Danielle LamEdrick Goffredo   Pregnancy History: [id number]: [id number] Estimated Date of Delivery: 04/26/15 Estimated Gestational Age: 1181w4d Attending: Damaris HippoJeffrey Denney, MD   [name] and her husband, Mr. Danielle Estes, were seen for follow-up genetic counseling because of abnormal ultrasound findings.  [name] previously had genetic counseling in the pregnancy given advanced maternal age. See previous genetic counseling note for detailed discussion from that visit.   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 Complete ultrasound results reported separately.   We discussed that the vermis is a structure in the middle of the cerebellum. Inferior vermian hypoplasia, which is  also sometimes referred to as Joellyn Quailsandy Walker Variant, refers to underdevelopment of the bottom of the vermis. We discussed that this finding can be isolated or associated with other brain anomalies or other congenital anomalies. We discussed that prognosis can depend in part on the underlying etiology. We reviewed the extremely variable prognosis of inferior vermian hypoplasia in isolated cases ranging from normal neurodevelopmental outcome to developmental delay, seizures, and/or behavioral impairments. A 2014 prospective study (Tarui et al.) of 20 individuals with prenatal diagnosis of inferior vermian hypoplasia found that 17 of the individuals had normal neurodevelopmental outcome; Two of the individuals with abnormal neurodevelopmental development were found postnatally to have additional brain malformations.   We discussed that majority of cases of isolated inferior vermian hypoplasia are sporadic but some cases can be due to underlying chromosome or genetic etiologies. We reviewed chromosomes. [name] previously had noninvasive prenatal screening (NIPS)/cell  free DNA, which was within normal range for the conditions screened. We reviewed the conditions for which this screens and the detection rates for each one. We discussed the option of amniocentesis in the pregnancy for karyotype and chromosome microarray analysis. A chromosomal microarray is a DNA analysis that looks for changes in copy number of genetic material. A control sample of DNA is used for comparison at thousands of loci across the genome. Microarray analysis allows for the detection of genetic deletions and duplications that are 100 times smaller than those identified by routine chromosome analysis.  Microarray analysis is able to detect deletions or duplications of the tested regions, thus it would identify aneuploidy. It would, however, not identify a structural rearrangement or smaller deletions, duplications or point mutations. Thus, while microarray is designed to identify a large number of conditions, it can not rule out all type of intellectual disability, birth defects or other genetic conditions. We discussed that microarray analysis may detect copy number variants that indicate predisposition to health conditions and can detect copy number variants of unknown significance in some cases. We discussed the risks, benefits, and limitations of amniocentesis including the associated 1 in 300-500 risk for complications including spontaneous preterm labor and delivery. We discussed that alternatively, postnatal chromosome analysis could be performed, if warranted, after delivery. After careful consideration, [name] that she would like to pursue amniocentesis for karyotype and chromosome microarray analyses. She was unable to stay today for the procedure to be performed and scheduled to return on 02/20/15 for ultrasound and amniocentesis.   Additionally, inferior vermian hypoplasia can be due to an underlying single gene condition, such as Joubert syndrome, which can follow various patterns  of inheritance. Single gene conditions are difficult to diagnose prenatally unless family history and/or ultrasound findings are suggestive of a particular syndrome.   We discussed  the availability of fetal MRI to further characterize brain anatomy. We also discussed the importance of postnatal brain imaging to confirm the finding and to assess for additional brain abnormalities. Available literature report approximately 68% of cases with a prenatal diagnosis of isolated inferior vermian hypoplasia had a postnatal confirmation (Limperopoulos et al. 2005).  They were counseled that the prognosis depends upon the underlying etiology and association with other anomalies.  In cases of isolated inferior vermian hypoplasia, neurodevelopmental outcome varies greatly but with the majority of isolated cases having overall good prognosis. [name] also elected to pursue fetal MRI in the pregnancy, which is being facilitated for her.   I counseled this couple regarding the above risks and available options.  The approximate face-to-face time with the genetic counselor was 30 minutes.    Danielle Plowman, MS Certified Genetic Counselor 02/12/2015

## 2015-02-13 ENCOUNTER — Encounter: Payer: Self-pay | Admitting: *Deleted

## 2015-02-19 ENCOUNTER — Encounter: Payer: Self-pay | Admitting: Family Medicine

## 2015-02-19 ENCOUNTER — Ambulatory Visit (INDEPENDENT_AMBULATORY_CARE_PROVIDER_SITE_OTHER): Payer: Non-veteran care | Admitting: Family Medicine

## 2015-02-19 VITALS — BP 111/64 | HR 81 | Temp 98.2°F | Wt 251.1 lb

## 2015-02-19 DIAGNOSIS — O09523 Supervision of elderly multigravida, third trimester: Secondary | ICD-10-CM

## 2015-02-19 DIAGNOSIS — O10013 Pre-existing essential hypertension complicating pregnancy, third trimester: Secondary | ICD-10-CM

## 2015-02-19 DIAGNOSIS — O0992 Supervision of high risk pregnancy, unspecified, second trimester: Secondary | ICD-10-CM

## 2015-02-19 DIAGNOSIS — Z9889 Other specified postprocedural states: Secondary | ICD-10-CM

## 2015-02-19 DIAGNOSIS — Z98891 History of uterine scar from previous surgery: Secondary | ICD-10-CM

## 2015-02-19 LAB — POCT URINALYSIS DIP (DEVICE)
Bilirubin Urine: NEGATIVE
Glucose, UA: NEGATIVE mg/dL
HGB URINE DIPSTICK: NEGATIVE
Ketones, ur: 15 mg/dL — AB
Leukocytes, UA: NEGATIVE
Nitrite: NEGATIVE
PH: 6 (ref 5.0–8.0)
PROTEIN: 30 mg/dL — AB
Specific Gravity, Urine: >=1.03 (ref 1.005–1.030)
Urobilinogen, UA: 1 mg/dL (ref 0.0–1.0)

## 2015-02-19 NOTE — Progress Notes (Signed)
C/o having problems with sleep- wakes up and can't go back to sleep. Cannot take prenatal vitamins because allergic to omega 3, no pnv without omega 3. States cannot take flintstones vitamins because can't tolerate them. C/o a lot of braxton hicks contractions last night about every 4.5 minutes for about 6 contractions.

## 2015-02-19 NOTE — Progress Notes (Signed)
Desires VBAC with BTL--did not sign consent today--please sign next visit. For fetal MRI and neurosurgery consult For amnio and karyotyping and micro-array next week. Some BH contractions only-- [facility]wk [facility]

## 2015-02-19 NOTE — Patient Instructions (Signed)
Third Trimester of Pregnancy The third trimester is from week 29 through week 42, months 7 through 9. The third trimester is a time when the fetus is growing rapidly. At the end of the ninth month, the fetus is about 20 inches in length and weighs 6-10 pounds.  BODY CHANGES Your body goes through many changes during pregnancy. The changes vary from woman to woman.   Your weight will continue to increase. You can expect to gain 25-35 pounds (11-16 kg) by the end of the pregnancy.  You may begin to get stretch marks on your hips, abdomen, and breasts.  You may urinate more often because the fetus is moving lower into your pelvis and pressing on your bladder.  You may develop or continue to have heartburn as a result of your pregnancy.  You may develop constipation because certain hormones are causing the muscles that push waste through your intestines to slow down.  You may develop hemorrhoids or swollen, bulging veins (varicose veins).  You may have pelvic pain because of the weight gain and pregnancy hormones relaxing your joints between the bones in your pelvis. Backaches may result from overexertion of the muscles supporting your posture.  You may have changes in your hair. These can include thickening of your hair, rapid growth, and changes in texture. Some women also have hair loss during or after pregnancy, or hair that feels dry or thin. Your hair will most likely return to normal after your baby is born.  Your breasts will continue to grow and be tender. A yellow discharge may leak from your breasts called colostrum.  Your belly button may stick out.  You may feel short of breath because of your expanding uterus.  You may notice the fetus "dropping," or moving lower in your abdomen.  You may have a bloody mucus discharge. This usually occurs a few days to a week before labor begins.  Your cervix becomes thin and soft (effaced) near your due date. WHAT TO EXPECT AT YOUR  PRENATAL EXAMS  You will have prenatal exams every 2 weeks until week 36. Then, you will have weekly prenatal exams. During a routine prenatal visit:  You will be weighed to make sure you and the fetus are growing normally.  Your blood pressure is taken.  Your abdomen will be measured to track your baby's growth.  The fetal heartbeat will be listened to.  Any test results from the previous visit will be discussed.  You may have a cervical check near your due date to see if you have effaced. At around 36 weeks, your caregiver will check your cervix. At the same time, your caregiver will also perform a test on the secretions of the vaginal tissue. This test is to determine if a type of bacteria, Group B streptococcus, is present. Your caregiver will explain this further. Your caregiver may ask you:  What your birth plan is.  How you are feeling.  If you are feeling the baby move.  If you have had any abnormal symptoms, such as leaking fluid, bleeding, severe headaches, or abdominal cramping.  If you have any questions. Other tests or screenings that may be performed during your third trimester include:  Blood tests that check for low iron levels (anemia).  Fetal testing to check the health, activity level, and growth of the fetus. Testing is done if you have certain medical conditions or if there are problems during the pregnancy. FALSE LABOR You may feel small, irregular contractions that   eventually go away. These are called Braxton Hicks contractions, or false labor. Contractions may last for hours, days, or even weeks before true labor sets in. If contractions come at regular intervals, intensify, or become painful, it is best to be seen by your caregiver.  SIGNS OF LABOR   Menstrual-like cramps.  Contractions that are 5 minutes apart or less.  Contractions that start on the top of the uterus and spread down to the lower abdomen and back.  A sense of increased pelvic  pressure or back pain.  A watery or bloody mucus discharge that comes from the vagina. If you have any of these signs before the 37th week of pregnancy, call your caregiver right away. You need to go to the hospital to get checked immediately. HOME CARE INSTRUCTIONS   Avoid all smoking, herbs, alcohol, and unprescribed drugs. These chemicals affect the formation and growth of the baby.  Follow your caregiver's instructions regarding medicine use. There are medicines that are either safe or unsafe to take during pregnancy.  Exercise only as directed by your caregiver. Experiencing uterine cramps is a good sign to stop exercising.  Continue to eat regular, healthy meals.  Wear a good support bra for breast tenderness.  Do not use hot tubs, steam rooms, or saunas.  Wear your seat belt at all times when driving.  Avoid raw meat, uncooked cheese, cat litter boxes, and soil used by cats. These carry germs that can cause birth defects in the baby.  Take your prenatal vitamins.  Try taking a stool softener (if your caregiver approves) if you develop constipation. Eat more high-fiber foods, such as fresh vegetables or fruit and whole grains. Drink plenty of fluids to keep your urine clear or pale yellow.  Take warm sitz baths to soothe any pain or discomfort caused by hemorrhoids. Use hemorrhoid cream if your caregiver approves.  If you develop varicose veins, wear support hose. Elevate your feet for 15 minutes, 3-4 times a day. Limit salt in your diet.  Avoid heavy lifting, wear low heal shoes, and practice good posture.  Rest a lot with your legs elevated if you have leg cramps or low back pain.  Visit your dentist if you have not gone during your pregnancy. Use a soft toothbrush to brush your teeth and be gentle when you floss.  A sexual relationship may be continued unless your caregiver directs you otherwise.  Do not travel far distances unless it is absolutely necessary and only  with the approval of your caregiver.  Take prenatal classes to understand, practice, and ask questions about the labor and delivery.  Make a trial run to the hospital.  Pack your hospital bag.  Prepare the baby's nursery.  Continue to go to all your prenatal visits as directed by your caregiver. SEEK MEDICAL CARE IF:  You are unsure if you are in labor or if your water has broken.  You have dizziness.  You have mild pelvic cramps, pelvic pressure, or nagging pain in your abdominal area.  You have persistent nausea, vomiting, or diarrhea.  You have a bad smelling vaginal discharge.  You have pain with urination. SEEK IMMEDIATE MEDICAL CARE IF:   You have a fever.  You are leaking fluid from your vagina.  You have spotting or bleeding from your vagina.  You have severe abdominal cramping or pain.  You have rapid weight loss or gain.  You have shortness of breath with chest pain.  You notice sudden or extreme swelling   of your face, hands, ankles, feet, or legs.  You have not felt your baby move in over an hour.  You have severe headaches that do not go away with medicine.  You have vision changes. Document Released: 11/22/2001 Document Revised: 12/03/2013 Document Reviewed: 01/29/2013 [employer] Patient Information 2015 [employer]. This information is not intended to replace advice given to you by your health care provider. Make sure you discuss any questions you have with your health care provider.  Breastfeeding Deciding to breastfeed is one of the best choices you can make for you and your baby. A change in hormones during pregnancy causes your breast tissue to grow and increases the number and size of your milk ducts. These hormones also allow proteins, sugars, and fats from your blood supply to make breast milk in your milk-producing glands. Hormones prevent breast milk from being released before your baby is born as well as prompt milk flow after birth. Once  breastfeeding has begun, thoughts of your baby, as well as his or her sucking or crying, can stimulate the release of milk from your milk-producing glands.  BENEFITS OF BREASTFEEDING For Your Baby  Your first milk (colostrum) helps your baby's digestive system function better.   There are antibodies in your milk that help your baby fight off infections.   Your baby has a lower incidence of asthma, allergies, and sudden infant death syndrome.   The nutrients in breast milk are better for your baby than infant formulas and are designed uniquely for your baby's needs.   Breast milk improves your baby's brain development.   Your baby is less likely to develop other conditions, such as childhood obesity, asthma, or type 2 diabetes mellitus.  For You   Breastfeeding helps to create a very special bond between you and your baby.   Breastfeeding is convenient. Breast milk is always available at the correct temperature and costs nothing.   Breastfeeding helps to burn calories and helps you lose the weight gained during pregnancy.   Breastfeeding makes your uterus contract to its prepregnancy size faster and slows bleeding (lochia) after you give birth.   Breastfeeding helps to lower your risk of developing type 2 diabetes mellitus, osteoporosis, and breast or ovarian cancer later in life. SIGNS THAT YOUR BABY IS HUNGRY Early Signs of Hunger  Increased alertness or activity.  Stretching.  Movement of the head from side to side.  Movement of the head and opening of the mouth when the corner of the mouth or cheek is stroked (rooting).  Increased sucking sounds, smacking lips, cooing, sighing, or squeaking.  Hand-to-mouth movements.  Increased sucking of fingers or hands. Late Signs of Hunger  Fussing.  Intermittent crying. Extreme Signs of Hunger Signs of extreme hunger will require calming and consoling before your baby will be able to breastfeed successfully. Do not  wait for the following signs of extreme hunger to occur before you initiate breastfeeding:   Restlessness.  A loud, strong cry.   Screaming. BREASTFEEDING BASICS Breastfeeding Initiation  Find a comfortable place to sit or lie down, with your neck and back well supported.  Place a pillow or rolled up blanket under your baby to bring him or her to the level of your breast (if you are seated). Nursing pillows are specially designed to help support your arms and your baby while you breastfeed.  Make sure that your baby's abdomen is facing your abdomen.   Gently massage your breast. With your fingertips, massage from your chest   wall toward your nipple in a circular motion. This encourages milk flow. You may need to continue this action during the feeding if your milk flows slowly.  Support your breast with 4 fingers underneath and your thumb above your nipple. Make sure your fingers are well away from your nipple and your baby's mouth.   Stroke your baby's lips gently with your finger or nipple.   When your baby's mouth is open wide enough, quickly bring your baby to your breast, placing your entire nipple and as much of the colored area around your nipple (areola) as possible into your baby's mouth.   More areola should be visible above your baby's upper lip than below the lower lip.   Your baby's tongue should be between his or her lower gum and your breast.   Ensure that your baby's mouth is correctly positioned around your nipple (latched). Your baby's lips should create a seal on your breast and be turned out (everted).  It is common for your baby to suck about 2-3 minutes in order to start the flow of breast milk. Latching Teaching your baby how to latch on to your breast properly is very important. An improper latch can cause nipple pain and decreased milk supply for you and poor weight gain in your baby. Also, if your baby is not latched onto your nipple properly, he or she  may swallow some air during feeding. This can make your baby fussy. Burping your baby when you switch breasts during the feeding can help to get rid of the air. However, teaching your baby to latch on properly is still the best way to prevent fussiness from swallowing air while breastfeeding. Signs that your baby has successfully latched on to your nipple:    Silent tugging or silent sucking, without causing you pain.   Swallowing heard between every 3-4 sucks.    Muscle movement above and in front of his or her ears while sucking.  Signs that your baby has not successfully latched on to nipple:   Sucking sounds or smacking sounds from your baby while breastfeeding.  Nipple pain. If you think your baby has not latched on correctly, slip your finger into the corner of your baby's mouth to break the suction and place it between your baby's gums. Attempt breastfeeding initiation again. Signs of Successful Breastfeeding Signs from your baby:   A gradual decrease in the number of sucks or complete cessation of sucking.   Falling asleep.   Relaxation of his or her body.   Retention of a small amount of milk in his or her mouth.   Letting go of your breast by himself or herself. Signs from you:  Breasts that have increased in firmness, weight, and size 1-3 hours after feeding.   Breasts that are softer immediately after breastfeeding.  Increased milk volume, as well as a change in milk consistency and color by the fifth day of breastfeeding.   Nipples that are not sore, cracked, or bleeding. Signs That Your Baby is Getting Enough Milk  Wetting at least 3 diapers in a 24-hour period. The urine should be clear and pale yellow by age 5 days.  At least 3 stools in a 24-hour period by age 5 days. The stool should be soft and yellow.  At least 3 stools in a 24-hour period by age 7 days. The stool should be seedy and yellow.  No loss of weight greater than 10% of birth weight  during the first 3   days of age.  Average weight gain of 4-7 ounces (113-198 g) per week after age 4 days.  Consistent daily weight gain by age 5 days, without weight loss after the age of 2 weeks. After a feeding, your baby may spit up a small amount. This is common. BREASTFEEDING FREQUENCY AND DURATION Frequent feeding will help you make more milk and can prevent sore nipples and breast engorgement. Breastfeed when you feel the need to reduce the fullness of your breasts or when your baby shows signs of hunger. This is called "breastfeeding on demand." Avoid introducing a pacifier to your baby while you are working to establish breastfeeding (the first 4-6 weeks after your baby is born). After this time you may choose to use a pacifier. Research has shown that pacifier use during the first year of a baby's life decreases the risk of sudden infant death syndrome (SIDS). Allow your baby to feed on each breast as long as he or she wants. Breastfeed until your baby is finished feeding. When your baby unlatches or falls asleep while feeding from the first breast, offer the second breast. Because newborns are often sleepy in the first few weeks of life, you may need to awaken your baby to get him or her to feed. Breastfeeding times will vary from baby to baby. However, the following rules can serve as a guide to help you ensure that your baby is properly fed:  Newborns (babies 4 weeks of age or younger) may breastfeed every 1-3 hours.  Newborns should not go longer than 3 hours during the day or 5 hours during the night without breastfeeding.  You should breastfeed your baby a minimum of 8 times in a 24-hour period until you begin to introduce solid foods to your baby at around 6 months of age. BREAST MILK PUMPING Pumping and storing breast milk allows you to ensure that your baby is exclusively fed your breast milk, even at times when you are unable to breastfeed. This is especially important if you are  going back to work while you are still breastfeeding or when you are not able to be present during feedings. Your lactation consultant can give you guidelines on how long it is safe to store breast milk.  A breast pump is a machine that allows you to pump milk from your breast into a sterile bottle. The pumped breast milk can then be stored in a refrigerator or freezer. Some breast pumps are operated by hand, while others use electricity. Ask your lactation consultant which type will work best for you. Breast pumps can be purchased, but some hospitals and breastfeeding support groups lease breast pumps on a monthly basis. A lactation consultant can teach you how to hand express breast milk, if you prefer not to use a pump.  CARING FOR YOUR BREASTS WHILE YOU BREASTFEED Nipples can become dry, cracked, and sore while breastfeeding. The following recommendations can help keep your breasts moisturized and healthy:  Avoid using soap on your nipples.   Wear a supportive bra. Although not required, special nursing bras and tank tops are designed to allow access to your breasts for breastfeeding without taking off your entire bra or top. Avoid wearing underwire-style bras or extremely tight bras.  Air dry your nipples for 3-4minutes after each feeding.   Use only cotton bra pads to absorb leaked breast milk. Leaking of breast milk between feedings is normal.   Use lanolin on your nipples after breastfeeding. Lanolin helps to maintain your skin's   normal moisture barrier. If you use pure lanolin, you do not need to wash it off before feeding your baby again. Pure lanolin is not toxic to your baby. You may also hand express a few drops of breast milk and gently massage that milk into your nipples and allow the milk to air dry. In the first few weeks after giving birth, some women experience extremely full breasts (engorgement). Engorgement can make your breasts feel heavy, warm, and tender to the touch.  Engorgement peaks within 3-5 days after you give birth. The following recommendations can help ease engorgement:  Completely empty your breasts while breastfeeding or pumping. You may want to start by applying warm, moist heat (in the shower or with warm water-soaked hand towels) just before feeding or pumping. This increases circulation and helps the milk flow. If your baby does not completely empty your breasts while breastfeeding, pump any extra milk after he or she is finished.  Wear a snug bra (nursing or regular) or tank top for 1-2 days to signal your body to slightly decrease milk production.  Apply ice packs to your breasts, unless this is too uncomfortable for you.  Make sure that your baby is latched on and positioned properly while breastfeeding. If engorgement persists after 48 hours of following these recommendations, contact your health care provider or a lactation consultant. OVERALL HEALTH CARE RECOMMENDATIONS WHILE BREASTFEEDING  Eat healthy foods. Alternate between meals and snacks, eating 3 of each per day. Because what you eat affects your breast milk, some of the foods may make your baby more irritable than usual. Avoid eating these foods if you are sure that they are negatively affecting your baby.  Drink milk, fruit juice, and water to satisfy your thirst (about 10 glasses a day).   Rest often, relax, and continue to take your prenatal vitamins to prevent fatigue, stress, and anemia.  Continue breast self-awareness checks.  Avoid chewing and smoking tobacco.  Avoid alcohol and drug use. Some medicines that may be harmful to your baby can pass through breast milk. It is important to ask your health care provider before taking any medicine, including all over-the-counter and prescription medicine as well as vitamin and herbal supplements. It is possible to become pregnant while breastfeeding. If birth control is desired, ask your health care provider about options that  will be safe for your baby. SEEK MEDICAL CARE IF:   You feel like you want to stop breastfeeding or have become frustrated with breastfeeding.  You have painful breasts or nipples.  Your nipples are cracked or bleeding.  Your breasts are red, tender, or warm.  You have a swollen area on either breast.  You have a fever or chills.  You have nausea or vomiting.  You have drainage other than breast milk from your nipples.  Your breasts do not become full before feedings by the fifth day after you give birth.  You feel sad and depressed.  Your baby is too sleepy to eat well.  Your baby is having trouble sleeping.   Your baby is wetting less than 3 diapers in a 24-hour period.  Your baby has less than 3 stools in a 24-hour period.  Your baby's skin or the white part of his or her eyes becomes yellow.   Your baby is not gaining weight by 5 days of age. SEEK IMMEDIATE MEDICAL CARE IF:   Your baby is overly tired (lethargic) and does not want to wake up and feed.  Your baby   develops an unexplained fever. Document Released: 11/28/2005 Document Revised: 12/03/2013 Document Reviewed: 05/22/2013 [employer] Patient Information 2015 [employer]. This information is not intended to replace advice given to you by your health care provider. Make sure you discuss any questions you have with your health care provider.  

## 2015-02-20 ENCOUNTER — Ambulatory Visit (HOSPITAL_COMMUNITY)
Admission: RE | Admit: 2015-02-20 | Discharge: 2015-02-20 | Disposition: A | Discharge status: Home / Self Care | Payer: Medicaid Other | Source: Ambulatory Visit | Attending: Family Medicine | Admitting: Family Medicine

## 2015-02-20 ENCOUNTER — Encounter (HOSPITAL_COMMUNITY): Payer: Self-pay

## 2015-02-20 ENCOUNTER — Other Ambulatory Visit: Payer: Self-pay | Admitting: Family Medicine

## 2015-02-20 ENCOUNTER — Ambulatory Visit (HOSPITAL_COMMUNITY): Admission: RE | Admit: 2015-02-20 | Payer: Medicaid Other | Source: Ambulatory Visit

## 2015-02-20 DIAGNOSIS — O358XX0 Maternal care for other (suspected) fetal abnormality and damage, not applicable or unspecified: Secondary | ICD-10-CM | POA: Diagnosis not present

## 2015-02-20 DIAGNOSIS — Z3A30 30 weeks gestation of pregnancy: Secondary | ICD-10-CM | POA: Diagnosis not present

## 2015-02-20 DIAGNOSIS — O163 Unspecified maternal hypertension, third trimester: Secondary | ICD-10-CM

## 2015-02-20 DIAGNOSIS — O09523 Supervision of elderly multigravida, third trimester: Secondary | ICD-10-CM | POA: Insufficient documentation

## 2015-02-20 DIAGNOSIS — O283 Abnormal ultrasonic finding on antenatal screening of mother: Secondary | ICD-10-CM

## 2015-02-20 DIAGNOSIS — O10013 Pre-existing essential hypertension complicating pregnancy, third trimester: Secondary | ICD-10-CM | POA: Diagnosis present

## 2015-02-20 DIAGNOSIS — Q031 Atresia of foramina of Magendie and Luschka: Secondary | ICD-10-CM

## 2015-02-20 DIAGNOSIS — O359XX0 Maternal care for (suspected) fetal abnormality and damage, unspecified, not applicable or unspecified: Secondary | ICD-10-CM | POA: Insufficient documentation

## 2015-02-20 DIAGNOSIS — Z3A33 33 weeks gestation of pregnancy: Secondary | ICD-10-CM

## 2015-02-20 DIAGNOSIS — O350XX0 Maternal care for (suspected) central nervous system malformation in fetus, not applicable or unspecified: Secondary | ICD-10-CM | POA: Insufficient documentation

## 2015-02-20 DIAGNOSIS — O3509X0 Maternal care for (suspected) other central nervous system malformation or damage in fetus, not applicable or unspecified: Secondary | ICD-10-CM | POA: Insufficient documentation

## 2015-02-23 ENCOUNTER — Ambulatory Visit (HOSPITAL_COMMUNITY): Admission: RE | Admit: 2015-02-23 | Payer: Non-veteran care | Source: Ambulatory Visit

## 2015-02-24 ENCOUNTER — Ambulatory Visit (HOSPITAL_COMMUNITY)
Admission: RE | Admit: 2015-02-24 | Discharge: 2015-02-24 | Disposition: A | Discharge status: Home / Self Care | Payer: Medicaid Other | Source: Ambulatory Visit | Attending: Obstetrics and Gynecology | Admitting: Obstetrics and Gynecology

## 2015-02-24 DIAGNOSIS — O350XX0 Maternal care for (suspected) central nervous system malformation in fetus, not applicable or unspecified: Secondary | ICD-10-CM

## 2015-02-24 DIAGNOSIS — O3509X0 Maternal care for (suspected) other central nervous system malformation or damage in fetus, not applicable or unspecified: Secondary | ICD-10-CM

## 2015-02-26 ENCOUNTER — Other Ambulatory Visit: Payer: Non-veteran care

## 2015-03-03 ENCOUNTER — Ambulatory Visit (INDEPENDENT_AMBULATORY_CARE_PROVIDER_SITE_OTHER): Payer: Non-veteran care | Admitting: Obstetrics & Gynecology

## 2015-03-03 VITALS — BP 129/87 | HR 83 | Temp 97.3°F | Wt 248.4 lb

## 2015-03-03 DIAGNOSIS — O10013 Pre-existing essential hypertension complicating pregnancy, third trimester: Secondary | ICD-10-CM | POA: Diagnosis not present

## 2015-03-03 DIAGNOSIS — O3509X1 Maternal care for (suspected) other central nervous system malformation or damage in fetus, fetus 1: Secondary | ICD-10-CM

## 2015-03-03 DIAGNOSIS — O350XX1 Maternal care for (suspected) central nervous system malformation in fetus, fetus 1: Secondary | ICD-10-CM | POA: Diagnosis not present

## 2015-03-03 LAB — POCT URINALYSIS DIP (DEVICE)
Glucose, UA: NEGATIVE mg/dL
Hgb urine dipstick: NEGATIVE
Leukocytes, UA: NEGATIVE
NITRITE: NEGATIVE
PROTEIN: 30 mg/dL — AB
UROBILINOGEN UA: 1 mg/dL (ref 0.0–1.0)
pH: 6.5 (ref 5.0–8.0)

## 2015-03-03 NOTE — Patient Instructions (Signed)
Vaginal Birth After Cesarean Delivery Vaginal birth after cesarean delivery (VBAC) is giving birth vaginally after previously delivering a baby by a cesarean. In the past, if a woman had a cesarean delivery, all births afterward would be done by cesarean delivery. This is no longer true. It can be safe for the mother to try a vaginal delivery after having a cesarean delivery.  It is important to discuss VBAC with your health care provider early in the pregnancy so you can understand the risks, benefits, and options. It will give you time to decide what is best in your particular case. The final decision about whether to have a VBAC or repeat cesarean delivery should be between you and your health care provider. Any changes in your health or your baby's health during your pregnancy may make it necessary to change your initial decision about VBAC.  WOMEN WHO PLAN TO HAVE A VBAC SHOULD CHECK WITH THEIR HEALTH CARE PROVIDER TO BE SURE THAT:  The previous cesarean delivery was done with a low transverse uterine cut (incision) (not a vertical classical incision).   The birth canal is big enough for the baby.   There were no other operations on the uterus.   An electronic fetal monitor (EFM) will be on at all times during labor.   An operating room will be available and ready in case an emergency cesarean delivery is needed.   A health care provider and surgical nursing staff will be available at all times during labor to be ready to do an emergency delivery cesarean if necessary.   An anesthesiologist will be present in case an emergency cesarean delivery is needed.   The nursery is prepared and has adequate personnel and necessary equipment available to care for the baby in case of an emergency cesarean delivery. BENEFITS OF VBAC  Shorter stay in the hospital.   Avoidance of risks associated with cesarean delivery, such as:  Surgical complications, such as opening of the incision or  hernia in the incision.  Injury to other organs.  Fever. This can occur if an infection develops after surgery. It can also occur as a reaction to the medicine given to make you numb during the surgery.  Less blood loss and need for blood transfusions.  Lower risk of blood clots and infection.  Shorter recovery.   Decreased risk for having to remove the uterus (hysterectomy).   Decreased risk for the placenta to completely or partially cover the opening of the uterus (placenta previa) with a future pregnancy.   Decrease risk in future labor and delivery. RISKS OF A VBAC  Tearing (rupture) of the uterus. This is occurs in less than 1% of VBACs. The risk of this happening is higher if:  Steps are taken to begin the labor process (induce labor) or stimulate or strengthen contractions (augment labor).   Medicine is used to soften (ripen) the cervix.  Having to remove the uterus (hysterectomy) if it ruptures. VBAC SHOULD NOT BE DONE IF:  The previous cesarean delivery was done with a vertical (classical) or T-shaped incision or you do not know what kind of incision was made.   You had a ruptured uterus.   You have had certain types of surgery on your uterus, such as removal of uterine fibroids. Ask your health care provider about other types of surgeries that prevent you from having a VBAC.  You have certain medical or childbirth (obstetrical) problems.   There are problems with the baby.   You   have had two previous cesarean deliveries and no vaginal deliveries. OTHER FACTS TO KNOW ABOUT VBAC:  It is safe to have an epidural anesthetic with VBAC.   It is safe to turn the baby from a breech position (attempt an external cephalic version).   It is safe to try a VBAC with twins.   VBAC may not be successful if your baby weights 8.8 lb (4 kg) or more. However, weight predictions are not always accurate and should not be used alone to decide if VBAC is right for  you.  There is an increased failure rate if the time between the cesarean delivery and VBAC is less than 19 months.   Your health care provider may advise against a VBAC if you have preeclampsia (high blood pressure, protein in the urine, and swelling of face and extremities).   VBAC is often successful if you previously gave birth vaginally.   VBAC is often successful when the labor starts spontaneously before the due date.   Delivering a baby through a VBAC is similar to having a normal spontaneous vaginal delivery. Document Released: 05/21/2007 Document Revised: 04/14/2014 Document Reviewed: 06/27/2013 [employer] Patient Information 2015 [employer]. This information is not intended to replace advice given to you by your health care provider. Make sure you discuss any questions you have with your health care provider.  

## 2015-03-03 NOTE — Progress Notes (Signed)
Patient reports episode of spotting after intercourse.   NST @ [facility] on 3/25, US for growth @ [facility] on 4/1

## 2015-03-03 NOTE — Progress Notes (Signed)
NST reactive, signed VBAC consent

## 2015-03-04 ENCOUNTER — Encounter: Payer: Self-pay | Admitting: *Deleted

## 2015-03-06 ENCOUNTER — Encounter (HOSPITAL_COMMUNITY): Payer: Self-pay

## 2015-03-06 ENCOUNTER — Ambulatory Visit (HOSPITAL_COMMUNITY)
Admission: RE | Admit: 2015-03-06 | Discharge: 2015-03-06 | Disposition: A | Discharge status: Home / Self Care | Payer: Medicaid Other | Source: Ambulatory Visit | Attending: Obstetrics & Gynecology | Admitting: Obstetrics & Gynecology

## 2015-03-06 DIAGNOSIS — Z3A00 Weeks of gestation of pregnancy not specified: Secondary | ICD-10-CM | POA: Insufficient documentation

## 2015-03-06 DIAGNOSIS — I10 Essential (primary) hypertension: Secondary | ICD-10-CM | POA: Insufficient documentation

## 2015-03-06 DIAGNOSIS — O10019 Pre-existing essential hypertension complicating pregnancy, unspecified trimester: Secondary | ICD-10-CM | POA: Insufficient documentation

## 2015-03-06 DIAGNOSIS — O358XX0 Maternal care for other (suspected) fetal abnormality and damage, not applicable or unspecified: Secondary | ICD-10-CM | POA: Insufficient documentation

## 2015-03-06 DIAGNOSIS — Z3A32 32 weeks gestation of pregnancy: Secondary | ICD-10-CM | POA: Insufficient documentation

## 2015-03-06 DIAGNOSIS — O09529 Supervision of elderly multigravida, unspecified trimester: Secondary | ICD-10-CM | POA: Insufficient documentation

## 2015-03-09 ENCOUNTER — Telehealth: Payer: Self-pay | Admitting: *Deleted

## 2015-03-09 ENCOUNTER — Other Ambulatory Visit: Payer: Self-pay | Admitting: Family Medicine

## 2015-03-09 DIAGNOSIS — O283 Abnormal ultrasonic finding on antenatal screening of mother: Secondary | ICD-10-CM

## 2015-03-09 DIAGNOSIS — Z3A34 34 weeks gestation of pregnancy: Secondary | ICD-10-CM

## 2015-03-09 DIAGNOSIS — O163 Unspecified maternal hypertension, third trimester: Secondary | ICD-10-CM

## 2015-03-09 DIAGNOSIS — Z3A37 37 weeks gestation of pregnancy: Secondary | ICD-10-CM

## 2015-03-09 DIAGNOSIS — Z3A36 36 weeks gestation of pregnancy: Secondary | ICD-10-CM

## 2015-03-09 DIAGNOSIS — Z3A35 35 weeks gestation of pregnancy: Secondary | ICD-10-CM

## 2015-03-09 DIAGNOSIS — O09523 Supervision of elderly multigravida, third trimester: Secondary | ICD-10-CM

## 2015-03-09 NOTE — Telephone Encounter (Addendum)
Pt called earlier today and requested a call back from me. I called her back and she requested additional information regarding the purpose of having NST's and what the information means. 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 She added that she cannot afford to lose this job.  4/7  Pt has not kept scheduled appts on 3/29 and 4/5 for prenatal care and fetal testing. She also did not keep appt @ [facility] on 4/1 for US and NST. I called pt and left detailed message on her voice mail stating that I wanted to talk with her about appts needed in the clinic. If she is not able to come in twice weekly, we at least need to see her weekly for prenatal care and fetal testing. Please call back so that we can discuss this matter.  *Pt has appt tomorrow (4/8) @ [facility] for NST/US @ 0915.

## 2015-03-10 ENCOUNTER — Telehealth: Payer: Self-pay | Admitting: Family Medicine

## 2015-03-10 ENCOUNTER — Other Ambulatory Visit: Payer: Non-veteran care

## 2015-03-10 NOTE — Telephone Encounter (Signed)
Called patient about missed appointment, and left message to please give us a call.

## 2015-03-13 ENCOUNTER — Ambulatory Visit (HOSPITAL_COMMUNITY): Admission: RE | Admit: 2015-03-13 | Payer: Medicaid Other | Source: Ambulatory Visit

## 2015-03-17 ENCOUNTER — Other Ambulatory Visit: Payer: Non-veteran care

## 2015-03-17 ENCOUNTER — Encounter: Payer: Self-pay | Admitting: Family Medicine

## 2015-03-20 ENCOUNTER — Ambulatory Visit (HOSPITAL_COMMUNITY): Payer: Medicaid Other

## 2015-03-20 ENCOUNTER — Ambulatory Visit (HOSPITAL_COMMUNITY): Payer: Medicaid Other | Attending: Family Medicine

## 2015-03-20 ENCOUNTER — Telehealth: Payer: Self-pay | Admitting: General Practice

## 2015-03-20 NOTE — Telephone Encounter (Signed)
Patient called and left message stating this message is for Danielle Estes. She wants to know when she can come in next. 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 Called patient back and she states she doesn't know what she can do. States she cannot leave her job and needs to be there between 8-5 and doesn't understanding why we are doing all this testing anyway because she has never had this done before. Explained the recommended testing guidelines to patient and that I cannot speak to other offices but that our office follows those recommendations for testing. Told patient we are only open between 8-5 so unfortunately we wouldn't have anything outside those times. Patient became very upset stating she doesn't know what to do, she cannot leave this job but wants to take care of her baby as well. States this is really stressing her out. Told patient that the best thing will be for her to go to [facility] when she can and they can do everything there that we do. Told patient they are open 24/7 so she can pick times that best work for her and her schedule. Told patient I know that may not be convenient since it is an emergency room but I think that will be the easiest thing for her. Patient verbalized understanding to all and states she will go in in the next day or two. Patient had no other questions

## 2015-03-24 ENCOUNTER — Other Ambulatory Visit: Payer: Non-veteran care

## 2015-03-25 ENCOUNTER — Encounter (HOSPITAL_COMMUNITY): Payer: Self-pay | Admitting: *Deleted

## 2015-03-25 ENCOUNTER — Inpatient Hospital Stay (HOSPITAL_COMMUNITY)
Admission: AD | Admit: 2015-03-25 | Discharge: 2015-03-25 | Disposition: A | Discharge status: Home / Self Care | Payer: Non-veteran care | Source: Ambulatory Visit | Attending: Obstetrics & Gynecology | Admitting: Obstetrics & Gynecology

## 2015-03-25 DIAGNOSIS — O09523 Supervision of elderly multigravida, third trimester: Secondary | ICD-10-CM | POA: Diagnosis not present

## 2015-03-25 DIAGNOSIS — Z3A35 35 weeks gestation of pregnancy: Secondary | ICD-10-CM | POA: Diagnosis not present

## 2015-03-25 DIAGNOSIS — O26893 Other specified pregnancy related conditions, third trimester: Secondary | ICD-10-CM | POA: Diagnosis not present

## 2015-03-25 DIAGNOSIS — O133 Gestational [pregnancy-induced] hypertension without significant proteinuria, third trimester: Secondary | ICD-10-CM | POA: Insufficient documentation

## 2015-03-25 DIAGNOSIS — M549 Dorsalgia, unspecified: Secondary | ICD-10-CM | POA: Diagnosis not present

## 2015-03-25 DIAGNOSIS — Z3A37 37 weeks gestation of pregnancy: Secondary | ICD-10-CM | POA: Diagnosis not present

## 2015-03-25 LAB — URINALYSIS, ROUTINE W REFLEX MICROSCOPIC
BILIRUBIN URINE: NEGATIVE
GLUCOSE, UA: NEGATIVE mg/dL
HGB URINE DIPSTICK: NEGATIVE
KETONES UR: NEGATIVE mg/dL
LEUKOCYTES UA: NEGATIVE
NITRITE: NEGATIVE
PROTEIN: NEGATIVE mg/dL
Specific Gravity, Urine: >1.03 — ABNORMAL HIGH (ref 1.005–1.030)
Urobilinogen, UA: 0.2 mg/dL (ref 0.0–1.0)
pH: 6 (ref 5.0–8.0)

## 2015-03-25 LAB — COMPREHENSIVE METABOLIC PANEL
ALK PHOS: 104 U/L (ref 39–117)
ALT: 14 U/L (ref 0–35)
ANION GAP: 6 (ref 5–15)
AST: 14 U/L (ref 0–37)
Albumin: 3 g/dL — ABNORMAL LOW (ref 3.5–5.2)
BUN: 6 mg/dL (ref 6–23)
CHLORIDE: 111 mmol/L (ref 96–112)
CO2: 22 mmol/L (ref 19–32)
CREATININE: 0.58 mg/dL (ref 0.50–1.10)
Calcium: 8.7 mg/dL (ref 8.4–10.5)
GFR calc Af Amer: >90 mL/min (ref >90)
GFR calc non Af Amer: >90 mL/min (ref >90)
Glucose, Bld: 95 mg/dL (ref 70–99)
POTASSIUM: 3.8 mmol/L (ref 3.5–5.1)
Sodium: 139 mmol/L (ref 135–145)
Total Bilirubin: 0.4 mg/dL (ref 0.3–1.2)
Total Protein: 6.6 g/dL (ref 6.0–8.3)

## 2015-03-25 LAB — CBC
HCT: 34.6 % — ABNORMAL LOW (ref 36.0–46.0)
HEMOGLOBIN: 11.1 g/dL — AB (ref 12.0–15.0)
MCH: 29 pg (ref 26.0–34.0)
MCHC: 32.1 g/dL (ref 30.0–36.0)
MCV: 90.3 fL (ref 78.0–100.0)
Platelets: 203 10*3/uL (ref 150–400)
RBC: 3.83 MIL/uL — ABNORMAL LOW (ref 3.87–5.11)
RDW: 14.6 % (ref 11.5–15.5)
WBC: 6.1 10*3/uL (ref 4.0–10.5)

## 2015-03-25 LAB — PROTEIN / CREATININE RATIO, URINE
Creatinine, Urine: 267 mg/dL
Protein Creatinine Ratio: 0.12 (ref 0.00–0.15)
TOTAL PROTEIN, URINE: 33 mg/dL

## 2015-03-25 LAB — LACTATE DEHYDROGENASE: LDH: 127 U/L (ref 94–250)

## 2015-03-25 LAB — URIC ACID: Uric Acid, Serum: 3.7 mg/dL (ref 2.4–7.0)

## 2015-03-25 MED ORDER — LABETALOL HCL 200 MG PO TABS: 200.0000 mg | ORAL_TABLET | Freq: Two times a day (BID) | ORAL | Status: DC

## 2015-03-25 NOTE — [facility] Note (Signed)
Pt presents to [facility] for non stress test. States she is having an NST twice a week but due to her starting a new job she cant come during office hours. Denies any vaginal bleeding or LOF

## 2015-03-25 NOTE — Discharge Instructions (Signed)

## 2015-03-25 NOTE — [facility] Provider Note (Signed)
History     CSN: [id number]  Arrival date and time: 03/25/15 1740   None     Chief Complaint  Patient presents with  . Non-stress Test   HPI Comments: [name] is a 34 y.o. Female [phone] at 44w3dwho is here in [facility] for a scheduled NST. She is a patient in the [facility] She is here tonight for an NST; she is getting twice weekly NST's do to pregnancy induced HTN. She is coming to [facility] for NST's due to her work schedule.   Hypertension This is a recurrent problem. The current episode started more than 1 month ago. The problem is unchanged. The problem is controlled. Associated symptoms include peripheral edema (ankles ). Pertinent negatives include no blurred vision or headaches. Past treatments include beta blockers.     OB History    Gravida Para Term Preterm AB TAB SAB Ectopic Multiple Living   _0 0 0 0 0 0 0 3      Past Medical History  Diagnosis Date  . Headache   . Hypertension   . Multiple allergies     Past Surgical History  Procedure Laterality Date  . Cesarean section    . 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 Hypertension Maternal Aunt   . Cancer Maternal Aunt   . Heart disease Maternal Grandmother     History  Substance Use Topics  . Smoking status: Never Smoker   . Smokeless tobacco: Never Used  . Alcohol Use: No    Allergies:  Allergies  Allergen Reactions  . Garlic Anaphylaxis  . Latex Hives  . Shellfish Allergy Anaphylaxis  . Vinegar [Acetic Acid] Anaphylaxis  . Aspirin Other (See Comments)    Causes blood not to clot.  . Banana Hives  . Chocolate Other (See Comments)    Causes migraines.  . Codeine Hives  . Eggs Or Egg-Derived Products Hives and Swelling    Swelling is of the tongue.  . Iodine Hives  . Peanut-Containing Drug Products Itching  . Penicillins Hives and Itching    Causes itching of the throat.  . Strawberry Hives    Causes hives in  the mouth.  . Tylenol [Acetaminophen] Hives  . Citrus Rash    Causes rash around mouth.  . Pineapple Rash    Causes rash around the mouth.    Prescriptions prior to admission  Medication Sig Dispense Refill Last Dose  . labetalol (NORMODYNE) 200 MG tablet Take 200 mg by mouth daily.   03/25/2015 at 0730  . triamcinolone ointment (KENALOG) 0.5 % Apply 1 application topically 2 (two) times daily.   03/24/2015 at Unknown time   Results for orders placed or performed during the hospital encounter of 03/25/15 (from the past 48 hour(s))  Protein / creatinine ratio, urine     Status: None   Collection Time: 03/25/15  6:20 PM  Result Value Ref Range   Creatinine, Urine 267.00 mg/dL   Total Protein, Urine 33 mg/dL    Comment: NO NORMAL RANGE ESTABLISHED FOR THIS TEST   Protein Creatinine Ratio 0.12 0.00 - 0.15  Urinalysis, Routine w reflex microscopic     Status: Abnormal   Collection Time: 03/25/15  6:20 PM  Result Value Ref Range   Color, Urine YELLOW YELLOW   APPearance CLEAR CLEAR   Specific Gravity, Urine >1.030 (H) 1.005 -  1.030   pH 6.0 5.0 - 8.0   Glucose, UA NEGATIVE NEGATIVE mg/dL   Hgb urine dipstick NEGATIVE NEGATIVE   Bilirubin Urine NEGATIVE NEGATIVE   Ketones, ur NEGATIVE NEGATIVE mg/dL   Protein, ur NEGATIVE NEGATIVE mg/dL   Urobilinogen, UA 0.2 0.0 - 1.0 mg/dL   Nitrite NEGATIVE NEGATIVE   Leukocytes, UA NEGATIVE NEGATIVE    Comment: MICROSCOPIC NOT DONE ON URINES WITH NEGATIVE PROTEIN, BLOOD, LEUKOCYTES, NITRITE, OR GLUCOSE <1000 mg/dL.  CBC     Status: Abnormal   Collection Time: 03/25/15  6:35 PM  Result Value Ref Range   WBC 6.1 4.0 - 10.5 K/uL   RBC 3.83 (L) 3.87 - 5.11 MIL/uL   Hemoglobin 11.1 (L) 12.0 - 15.0 g/dL   HCT 34.6 (L) 36.0 - 46.0 %   MCV 90.3 78.0 - 100.0 fL   MCH 29.0 26.0 - 34.0 pg   MCHC 32.1 30.0 - 36.0 g/dL   RDW 14.6 11.5 - 15.5 %   Platelets 203 150 - 400 K/uL  Comprehensive metabolic panel     Status: Abnormal   Collection Time:  03/25/15  6:35 PM  Result Value Ref Range   Sodium 139 135 - 145 mmol/L   Potassium 3.8 3.5 - 5.1 mmol/L   Chloride 111 96 - 112 mmol/L   CO2 22 19 - 32 mmol/L   Glucose, Bld 95 70 - 99 mg/dL   BUN 6 6 - 23 mg/dL   Creatinine, Ser 0.58 0.50 - 1.10 mg/dL   Calcium 8.7 8.4 - 10.5 mg/dL   Total Protein 6.6 6.0 - 8.3 g/dL   Albumin 3.0 (L) 3.5 - 5.2 g/dL   AST 14 0 - 37 U/L   ALT 14 0 - 35 U/L   Alkaline Phosphatase 104 39 - 117 U/L   Total Bilirubin 0.4 0.3 - 1.2 mg/dL   GFR calc non Af Amer >90 >90 mL/min   GFR calc Af Amer >90 >90 mL/min    Comment: (NOTE) The eGFR has been calculated using the CKD EPI equation. This calculation has not been validated in all clinical situations. eGFR's persistently <90 mL/min signify possible Chronic Kidney Disease.    Anion gap 6 5 - 15  Uric acid     Status: None   Collection Time: 03/25/15  6:35 PM  Result Value Ref Range   Uric Acid, Serum 3.7 2.4 - 7.0 mg/dL  Lactate dehydrogenase     Status: None   Collection Time: 03/25/15  6:35 PM  Result Value Ref Range   LDH 127 94 - 250 U/L    Review of Systems  Eyes: Negative for blurred vision.  Gastrointestinal: Negative for abdominal pain.  Neurological: Negative for headaches.   Physical Exam   Blood pressure 140/94, pulse 91, temperature 97.7 F (36.5 C), resp. rate 18, last menstrual period 07/02/2014.  Physical Exam  Constitutional: She is oriented to person, place, and time. She appears well-developed and well-nourished. No distress.  HENT:  Head: Normocephalic.  Eyes: Pupils are equal, round, and reactive to light.  Neck: Neck supple.  Respiratory: Effort normal.  GI: Soft. She exhibits no distension. There is no tenderness.  Musculoskeletal: Normal range of motion.  Neurological: She is alert and oriented to person, place, and time. She has normal reflexes.  Negative clonus Negative lower extremity edema   Skin: Skin is warm. She is not diaphoretic.  Psychiatric: Her  behavior is normal.     Fetal Tracing: Baseline: 135 bpm  Variability:  Moderate  Accelerations: 15x15 Decelerations: None Toco: Quiet.   [facility] Course  Procedures  None  MDM  BP's elevated in [facility] PIH labs ordered.  Protein creatine ratio NST   Discussed patient with Dr. Glo Herring.   Assessment and Plan   A:  Pregnancy induced hypertension  P:  Discharge home in stable condition Increase labetalol to 200 mg BID Follow up in [place] as scheduled Return Saturday for NST.  Preeclampsia precautions.    Lezlie Lye, NP 03/25/2015 6:23 PM

## 2015-03-26 ENCOUNTER — Encounter (HOSPITAL_COMMUNITY): Payer: Self-pay | Admitting: *Deleted

## 2015-03-26 ENCOUNTER — Inpatient Hospital Stay (HOSPITAL_COMMUNITY)
Admission: AD | Admit: 2015-03-26 | Discharge: 2015-03-26 | Disposition: A | Discharge status: Home / Self Care | Payer: Non-veteran care | Source: Ambulatory Visit | Attending: Obstetrics & Gynecology | Admitting: Obstetrics & Gynecology

## 2015-03-26 DIAGNOSIS — M545 Low back pain: Secondary | ICD-10-CM | POA: Diagnosis not present

## 2015-03-26 DIAGNOSIS — M549 Dorsalgia, unspecified: Secondary | ICD-10-CM

## 2015-03-26 DIAGNOSIS — O9989 Other specified diseases and conditions complicating pregnancy, childbirth and the puerperium: Secondary | ICD-10-CM | POA: Insufficient documentation

## 2015-03-26 DIAGNOSIS — O26893 Other specified pregnancy related conditions, third trimester: Secondary | ICD-10-CM | POA: Diagnosis not present

## 2015-03-26 DIAGNOSIS — Z3A37 37 weeks gestation of pregnancy: Secondary | ICD-10-CM

## 2015-03-26 DIAGNOSIS — R102 Pelvic and perineal pain: Secondary | ICD-10-CM | POA: Diagnosis present

## 2015-03-26 DIAGNOSIS — Z3A35 35 weeks gestation of pregnancy: Secondary | ICD-10-CM | POA: Insufficient documentation

## 2015-03-26 DIAGNOSIS — O99891 Other specified diseases and conditions complicating pregnancy: Secondary | ICD-10-CM

## 2015-03-26 LAB — URINALYSIS, ROUTINE W REFLEX MICROSCOPIC
Bilirubin Urine: NEGATIVE
Glucose, UA: NEGATIVE mg/dL
Hgb urine dipstick: NEGATIVE
Ketones, ur: 15 mg/dL — AB
LEUKOCYTES UA: NEGATIVE
NITRITE: NEGATIVE
PH: 6 (ref 5.0–8.0)
Protein, ur: NEGATIVE mg/dL
Specific Gravity, Urine: >1.03 — ABNORMAL HIGH (ref 1.005–1.030)
Urobilinogen, UA: 1 mg/dL (ref 0.0–1.0)

## 2015-03-26 LAB — WET PREP, GENITAL
Clue Cells Wet Prep HPF POC: NONE SEEN
TRICH WET PREP: NONE SEEN
Yeast Wet Prep HPF POC: NONE SEEN

## 2015-03-26 MED ORDER — CYCLOBENZAPRINE HCL 10 MG PO TABS
10.0000 mg | ORAL_TABLET | Freq: Once | ORAL | Status: AC
  Administered 2015-03-26: 10 mg via ORAL
  Filled 2015-03-26: qty 1

## 2015-03-26 MED ORDER — CYCLOBENZAPRINE HCL 10 MG PO TABS: 10.0000 mg | ORAL_TABLET | Freq: Three times a day (TID) | ORAL | Status: DC | PRN

## 2015-03-26 NOTE — Discharge Instructions (Signed)
Back Exercises Back exercises help treat and prevent back injuries. The goal of back exercises is to increase the strength of your abdominal and back muscles and the flexibility of your back. These exercises should be started when you no longer have back pain. Back exercises include:  Pelvic Tilt. Lie on your back with your knees bent. Tilt your pelvis until the lower part of your back is against the floor. Hold this position 5 to 10 sec and repeat 5 to 10 times.  Knee to Chest. Pull first 1 knee up against your chest and hold for 20 to 30 seconds, repeat this with the other knee, and then both knees. This may be done with the other leg straight or bent, whichever feels better.  Sit-Ups or Curl-Ups. Bend your knees 90 degrees. Start with tilting your pelvis, and do a partial, slow sit-up, lifting your trunk only 30 to 45 degrees off the floor. Take at least 2 to 3 seconds for each sit-up. Do not do sit-ups with your knees out straight. If partial sit-ups are difficult, simply do the above but with only tightening your abdominal muscles and holding it as directed.  Hip-Lift. Lie on your back with your knees flexed 90 degrees. Push down with your feet and shoulders as you raise your hips a couple inches off the floor; hold for 10 seconds, repeat 5 to 10 times.  Back arches. Lie on your stomach, propping yourself up on bent elbows. Slowly press on your hands, causing an arch in your low back. Repeat 3 to 5 times. Any initial stiffness and discomfort should lessen with repetition over time.  Shoulder-Lifts. Lie face down with arms beside your body. Keep hips and torso pressed to floor as you slowly lift your head and shoulders off the floor. Do not overdo your exercises, especially in the beginning. Exercises may cause you some mild back discomfort which lasts for a few minutes; however, if the pain is more severe, or lasts for more than 15 minutes, do not continue exercises until you see your caregiver.  Improvement with exercise therapy for back problems is slow. Back Pain in Pregnancy Back pain during pregnancy is common. It happens in about half of all pregnancies. It is important for you and your baby that you remain active during your pregnancy.If you feel that back pain is not allowing you to remain active or sleep well, it is time to see your caregiver. Back pain may be caused by several factors related to changes during your pregnancy.Fortunately, unless you had trouble with your back before your pregnancy, the pain is likely to get better after you deliver. Low back pain usually occurs between the fifth and seventh months of pregnancy. It can, however, happen in the first couple months. Factors that increase the risk of back problems include:   Previous back problems.  Injury to your back.  Having twins or multiple births.  A chronic cough.  Stress.  Job-related repetitive motions.  Muscle or spinal disease in the back.  Family history of back problems, ruptured (herniated) discs, or osteoporosis.  Depression, anxiety, and panic attacks. CAUSES   When you are pregnant, your body produces a hormone called relaxin. This hormonemakes the ligaments connecting the low back and pubic bones more flexible. This flexibility allows the baby to be delivered more easily. When your ligaments are loose, your muscles need to work harder to support your back. Soreness in your back can come from tired muscles. Soreness can also come from  back tissues that are irritated since they are receiving less support.  As the baby grows, it puts pressure on the nerves and blood vessels in your pelvis. This can cause back pain.  As the baby grows and gets heavier during pregnancy, the uterus pushes the stomach muscles forward and changes your center of gravity. This makes your back muscles work harder to maintain good posture. SYMPTOMS  Lumbar pain during pregnancy Lumbar pain during pregnancy usually  occurs at or above the waist in the center of the back. There may be pain and numbness that radiates into your leg or foot. This is similar to low back pain experienced by non-pregnant women. It usually increases with sitting for long periods of time, standing, or repetitive lifting. Tenderness may also be present in the muscles along your upper back. Posterior pelvic pain during pregnancy Pain in the back of the pelvis is more common than lumbar pain in pregnancy. It is a deep pain felt in your side at the waistline, or across the tailbone (sacrum), or in both places. You may have pain on one or both sides. This pain can also go into the buttocks and backs of the upper thighs. Pubic and groin pain may also be present. The pain does not quickly resolve with rest, and morning stiffness may also be present. Pelvic pain during pregnancy can be brought on by most activities. A high level of fitness before and during pregnancy may or may not prevent this problem. Labor pain is usually 1 to 2 minutes apart, lasts for about 1 minute, and involves a bearing down feeling or pressure in your pelvis. However, if you are at term with the pregnancy, constant low back pain can be the beginning of early labor, and you should be aware of this. DIAGNOSIS  X-rays of the back should not be done during the first 12 to 14 weeks of the pregnancy and only when absolutely necessary during the rest of the pregnancy. MRIs do not give off radiation and are safe during pregnancy. MRIs also should only be done when absolutely necessary. HOME CARE INSTRUCTIONS  Exercise as directed by your caregiver. Exercise is the most effective way to prevent or manage back pain. If you have a back problem, it is especially important to avoid sports that require sudden body movements. Swimming and walking are great activities.  Do not stand in one place for long periods of time.  Do not wear high heels.  Sit in chairs with good posture. Use a  pillow on your lower back if necessary. Make sure your head rests over your shoulders and is not hanging forward.  Try sleeping on your side, preferably the left side, with a pillow or two between your legs. If you are sore after a night's rest, your bedmay betoo soft.Try placing a board between your mattress and box spring.  Listen to your body when lifting.If you are experiencing pain, ask for help or try bending yourknees more so you can use your leg muscles rather than your back muscles. Squat down when picking up something from the floor. Do not bend over.  Eat a healthy diet. Try to gain weight within your caregiver's recommendations.  Use heat or cold packs 3 to 4 times a day for 15 minutes to help with the pain.  Only take over-the-counter or prescription medicines for pain, discomfort, or fever as directed by your caregiver. Sudden (acute) back pain  Use bed rest for only the most extreme, acute episodes  of back pain. Prolonged bed rest over 48 hours will aggravate your condition.  Ice is very effective for acute conditions.  Put ice in a plastic bag.  Place a towel between your skin and the bag.  Leave the ice on for 10 to 20 minutes every 2 hours, or as needed.  Using heat packs for 30 minutes prior to activities is also helpful. Continued back pain See your caregiver if you have continued problems. Your caregiver can help or refer you for appropriate physical therapy. With conditioning, most back problems can be avoided. Sometimes, a more serious issue may be the cause of back pain. You should be seen right away if new problems seem to be developing. Your caregiver may recommend:  A maternity girdle.  An elastic sling.  A back brace.  A massage therapist or acupuncture. SEEK MEDICAL CARE IF:   You are not able to do most of your daily activities, even when taking the pain medicine you were given.  You need a referral to a physical therapist or  chiropractor.  You want to try acupuncture. SEEK IMMEDIATE MEDICAL CARE IF:  You develop numbness, tingling, weakness, or problems with the use of your arms or legs.  You develop severe back pain that is no longer relieved with medicines.  You have a sudden change in bowel or bladder control.  You have increasing pain in other areas of the body.  You develop shortness of breath, dizziness, or fainting.  You develop nausea, vomiting, or sweating.  You have back pain which is similar to labor pains.  You have back pain along with your water breaking or vaginal bleeding.  You have back pain or numbness that travels down your leg.  Your back pain developed after you fell.  You develop pain on one side of your back. You may have a kidney stone.  You see blood in your urine. You may have a bladder infection or kidney stone.  You have back pain with blisters. You may have shingles. Back pain is fairly common during pregnancy but should not be accepted as just part of the process. Back pain should always be treated as soon as possible. This will make your pregnancy as pleasant as possible. Document Released: 03/08/2006 Document Revised: 02/20/2012 Document Reviewed: 04/19/2011 [facility] Patient Information 2015 [place], [place]. This information is not intended to replace advice given to you by your health care provider. Make sure you discuss any questions you have with your health care provider.  See your caregivers for assistance with developing a proper back exercise program. Document Released: 01/05/2005 Document Revised: 02/20/2012 Document Reviewed: 09/29/2011 [facility] Patient Information 2015 [place], [place]. This information is not intended to replace advice given to you by your health care provider. Make sure you discuss any questions you have with your health care provider.

## 2015-03-26 NOTE — MAU Provider Note (Signed)
History     CSN: [id number]  Arrival date and time: 03/26/15 1810   None     Chief Complaint  Patient presents with  . Contractions   HPI Patient is 34 y.o. [id number] [redacted]w[redacted]d here with complaints of LBP.  Patient reports that when she got home yesterday.  She went to the bathroom and she started having pelvic pain radiating from the inside of the vagina to the butt.  Pain is sharp in nature and is mostly constant.  Pain in vagina relieved by reclining but worsens back pain.  Patient is able to ambulate but feels she has to waddle.  Warm showers help temporarily.  Denies hematuria, dysuria. Since Sunday, she has had urinary urgency.    +Vaginal discharge and odor.  +FM, denies LOF, VB, contractions.   OB History    Gravida Para Term Preterm AB TAB SAB Ectopic Multiple Living   0 0 0 0 0 0 3      Past Medical History  Diagnosis Date  . Headache   . Hypertension   . Multiple allergies     Past Surgical History  Procedure Laterality Date  . Breast surgery      had lumped removed at age 63. non cancer  . Cesarean section      C/S x 1    Family History  Problem Relation Age of Onset  . Hypertension Maternal Aunt   . Cancer Maternal Aunt   . Heart disease Maternal Grandmother     History  Substance Use Topics  . Smoking status: Never Smoker   . Smokeless tobacco: Never Used  . Alcohol Use: No    Allergies:  Allergies  Allergen Reactions  . Garlic Anaphylaxis  . Latex Hives  . Shellfish Allergy Anaphylaxis  . Vinegar [Acetic Acid] Anaphylaxis  . Aspirin Other (See Comments)    Causes blood not to clot.  . Banana Hives  . Chocolate Other (See Comments)    Causes migraines.  . Codeine Hives  . Eggs Or Egg-Derived Products Hives and Swelling    Swelling is of the tongue.  . Iodine Hives  . Peanut-Containing Drug Products Itching  . Penicillins Hives and Itching    Causes itching of the throat.  . Strawberry Hives    Causes hives in the mouth.  .  Tylenol [Acetaminophen] Hives  . Citrus Rash    Causes rash around mouth.  . Pineapple Rash    Causes rash around the mouth.    Prescriptions prior to admission  Medication Sig Dispense Refill Last Dose  . labetalol (NORMODYNE) 200 MG tablet Take 1 tablet (200 mg total) by mouth 2 (two) times daily. 60 tablet 1   . triamcinolone ointment (KENALOG) 0.5 % Apply 1 application topically 2 (two) times daily.   03/24/2015 at Unknown time    Review of Systems  Constitutional: Negative for fever.  Eyes: Negative for blurred vision and double vision.  Cardiovascular: Negative for chest pain.  Gastrointestinal: Negative for nausea, vomiting and diarrhea.  Genitourinary: Positive for urgency and frequency. Negative for dysuria and hematuria.  Musculoskeletal: Positive for back pain.  Skin: Negative for rash.  Neurological: Negative for headaches.   Physical Exam   Blood pressure 122/87, pulse 86, temperature 98 F (36.7 C), resp. rate 18, height 6' (1.829 m), weight 253 lb 6.4 oz (114.941 kg), last menstrual period 07/02/2014.  Physical Exam  Constitutional: She is oriented to person, place, and time. She appears well-developed and well-nourished.  No distress.  HENT:  Head: Normocephalic and atraumatic.  Eyes: EOM are normal. No scleral icterus.  Neck: Neck supple.  Cardiovascular: Normal rate, regular rhythm, normal heart sounds and intact distal pulses.   No murmur heard. Respiratory: Effort normal and breath sounds normal. No respiratory distress. She has no wheezes.  GI: Soft. Bowel sounds are normal. There is no tenderness.  Gravid   Musculoskeletal: Normal range of motion. She exhibits no edema or tenderness.  Lymphadenopathy:    She has no cervical adenopathy.  Neurological: She is alert and oriented to person, place, and time.  Skin: Skin is warm and dry. No rash noted.  Psychiatric: She has a normal mood and affect. Her behavior is normal.   Dilation: 1 Effacement (%):  50 Station: -3 Exam by:: PharmacologistAmber Stovall RN  Fetal monitoringBaseline: 145 bpm, Variability: Good {> 6 bpm) and Accelerations: Reactive Uterine activity uterine irritation   Results for orders placed or performed during the hospital encounter of 03/26/15 (from the past 24 hour(s))  Urinalysis, Routine w reflex microscopic     Status: Abnormal   Collection Time: 03/26/15  6:25 PM  Result Value Ref Range   Color, Urine YELLOW YELLOW   APPearance CLEAR CLEAR   Specific Gravity, Urine >1.030 (H) 1.005 - 1.030   pH 6.0 5.0 - 8.0   Glucose, UA NEGATIVE NEGATIVE mg/dL   Hgb urine dipstick NEGATIVE NEGATIVE   Bilirubin Urine NEGATIVE NEGATIVE   Ketones, ur 15 (A) NEGATIVE mg/dL   Protein, ur NEGATIVE NEGATIVE mg/dL   Urobilinogen, UA 1.0 0.0 - 1.0 mg/dL   Nitrite NEGATIVE NEGATIVE   Leukocytes, UA NEGATIVE NEGATIVE  Wet prep, genital     Status: Abnormal   Collection Time: 03/26/15  7:40 PM  Result Value Ref Range   Yeast Wet Prep HPF POC NONE SEEN NONE SEEN   Trich, Wet Prep NONE SEEN NONE SEEN   Clue Cells Wet Prep HPF POC NONE SEEN NONE SEEN   WBC, Wet Prep HPF POC FEW (A) NONE SEEN     MAU Course  Procedures  MDM Wet prep, CG/CT obtained  Assessment and Plan  Patient signed over to [employer]UM! BrandsFrances Cresnzo  Ravan Schlemmer, Kathie RhodesAshly M, DO 03/26/2015, 7:01 PM

## 2015-03-26 NOTE — MAU Note (Signed)
Pt reports she is having increased pelvic pain since last night. Also c/o back pain and cramping.Denies vag bleeding or SROM. Good fetal movement reported.

## 2015-03-26 NOTE — MAU Provider Note (Signed)
First Provider Initiated Contact with Patient 03/26/15 2032      Chief Complaint: Back pain and pelvic pressure/pain   Danielle Estes is  34 y.o. [phone] at 7629w4d presents complaining of LBP and pelvic pressure since yesterday. Denies contractions, dysuria, VB, SROM.  acitve fetus  . Obstetrical/Gynecological History: OB History    Gravida Para Term Preterm AB TAB SAB Ectopic Multiple Living   4 3 3  0 0 0 0 0 0 3     Past Medical History: Past Medical History  Diagnosis Date  . Headache   . Hypertension   . Multiple allergies     Past Surgical History: Past Surgical History  Procedure Laterality Date  . Breast surgery      had lumped removed at age 34. non cancer  . Cesarean section      C/S x 1    Family History: Family History  Problem Relation Age of Onset  . Hypertension Maternal Aunt   . Cancer Maternal Aunt   . Heart disease Maternal Grandmother     Social History: History  Substance Use Topics  . Smoking status: Never Smoker   . Smokeless tobacco: Never Used  . Alcohol Use: No    Allergies:  Allergies  Allergen Reactions  . Garlic Anaphylaxis  . Latex Hives  . Shellfish Allergy Anaphylaxis  . Vinegar [Acetic Acid] Anaphylaxis  . Aspirin Other (See Comments)    Causes blood not to clot.  . Banana Hives  . Chocolate Other (See Comments)    Causes migraines.  . Codeine Hives  . Eggs Or Egg-Derived Products Hives and Swelling    Swelling is of the tongue.  . Iodine Hives  . Peanut-Containing Drug Products Itching  . Penicillins Hives and Itching    Causes itching of the throat.  . Strawberry Hives    Causes hives in the mouth.  . Tylenol [Acetaminophen] Hives  . Citrus Rash    Causes rash around mouth.  . Pineapple Rash    Causes rash around the mouth.    Meds:  Prescriptions prior to admission  Medication Sig Dispense Refill Last Dose  . labetalol (NORMODYNE) 200 MG tablet Take 1 tablet (200 mg total) by mouth 2 (two) times daily.  60 tablet 1 03/26/2015 at 1530  . triamcinolone ointment (KENALOG) 0.5 % Apply 1 application topically 2 (two) times daily.   03/26/2015 at Unknown time    Review of Systems   Constitutional: Negative for fever and chills Eyes: Negative for visual disturbances Respiratory: Negative for shortness of breath, dyspnea Cardiovascular: Negative for chest pain or palpitations  Gastrointestinal: Negative for vomiting, diarrhea and constipation Genitourinary: Negative for dysuria and urgency Musculoskeletal: Negative for back pain, joint pain, myalgias  Neurological: Negative for dizziness and headaches     Physical Exam  Blood pressure 122/87, pulse 86, temperature 98 F (36.7 C), resp. rate 18, height 6' (1.829 m), weight 114.941 kg (253 lb 6.4 oz), last menstrual period 07/02/2014. GENERAL: Well-developed, well-nourished female in no acute distress.  LUNGS: Clear to auscultation bilaterally.  HEART: Regular rate and rhythm. ABDOMEN: Soft, nontender, nondistended, gravid.  EXTREMITIES: Nontender, no edema, 2+ distal pulses. DTR's 2+ CERVICAL EXAM: Dilatation 1cm   Effacement 0%   Station -3   Presentation: cephalic FHT:  Baseline rate 140 bpm   Variability moderate  Accelerations present   Decelerations none Contractions: Every 0 mins   Labs: Results for orders placed or performed during the hospital encounter of 03/26/15 (from the past  24 hour(s))  Urinalysis, Routine w reflex microscopic   Collection Time: 03/26/15  6:25 PM  Result Value Ref Range   Color, Urine YELLOW YELLOW   APPearance CLEAR CLEAR   Specific Gravity, Urine >1.030 (H) 1.005 - 1.030   pH 6.0 5.0 - 8.0   Glucose, UA NEGATIVE NEGATIVE mg/dL   Hgb urine dipstick NEGATIVE NEGATIVE   Bilirubin Urine NEGATIVE NEGATIVE   Ketones, ur 15 (A) NEGATIVE mg/dL   Protein, ur NEGATIVE NEGATIVE mg/dL   Urobilinogen, UA 1.0 0.0 - 1.0 mg/dL   Nitrite NEGATIVE NEGATIVE   Leukocytes, UA NEGATIVE NEGATIVE  Wet prep, genital    Collection Time: 03/26/15  7:40 PM  Result Value Ref Range   Yeast Wet Prep HPF POC NONE SEEN NONE SEEN   Trich, Wet Prep NONE SEEN NONE SEEN   Clue Cells Wet Prep HPF POC NONE SEEN NONE SEEN   WBC, Wet Prep HPF POC FEW (A) NONE SEEN   Imaging Studies:  No results found.  Assessment: Danielle Estes is  34 y.o. [phone] at [redacted]w[redacted]d presents with back pain and pelvi pressure r/t being [redacted] weeks pregnant with her 4th baby:  Musculoskeletal pain.  .  Plan: Rx Flexeril prn; back excercises, ice/heat.  Consider Lidoderm patches  Danielle Estes,Danielle Estes 4/14/20168:42 PM '

## 2015-03-27 ENCOUNTER — Ambulatory Visit (HOSPITAL_COMMUNITY): Admission: RE | Admit: 2015-03-27 | Payer: Medicaid Other | Source: Ambulatory Visit

## 2015-03-27 ENCOUNTER — Ambulatory Visit (HOSPITAL_COMMUNITY): Payer: Medicaid Other

## 2015-03-27 LAB — GC/CHLAMYDIA PROBE AMP (~~LOC~~) NOT AT ARMC
Chlamydia: NEGATIVE
Neisseria Gonorrhea: NEGATIVE

## 2015-04-01 ENCOUNTER — Encounter: Payer: Self-pay | Admitting: General Practice

## 2015-04-03 ENCOUNTER — Ambulatory Visit (HOSPITAL_COMMUNITY): Payer: Non-veteran care

## 2015-04-07 ENCOUNTER — Telehealth: Payer: Self-pay

## 2015-04-07 ENCOUNTER — Encounter (HOSPITAL_COMMUNITY): Payer: Self-pay | Admitting: *Deleted

## 2015-04-07 ENCOUNTER — Inpatient Hospital Stay (HOSPITAL_COMMUNITY)
Admission: AD | Admit: 2015-04-07 | Discharge: 2015-04-07 | Discharge status: Against Medical Advice | Payer: Non-veteran care | Source: Ambulatory Visit | Attending: Family Medicine | Admitting: Family Medicine

## 2015-04-07 DIAGNOSIS — Z3A37 37 weeks gestation of pregnancy: Secondary | ICD-10-CM | POA: Insufficient documentation

## 2015-04-07 DIAGNOSIS — O10913 Unspecified pre-existing hypertension complicating pregnancy, third trimester: Secondary | ICD-10-CM

## 2015-04-07 DIAGNOSIS — O9989 Other specified diseases and conditions complicating pregnancy, childbirth and the puerperium: Secondary | ICD-10-CM

## 2015-04-07 DIAGNOSIS — R197 Diarrhea, unspecified: Secondary | ICD-10-CM

## 2015-04-07 DIAGNOSIS — R112 Nausea with vomiting, unspecified: Secondary | ICD-10-CM | POA: Diagnosis not present

## 2015-04-07 LAB — URINALYSIS, ROUTINE W REFLEX MICROSCOPIC
Bilirubin Urine: NEGATIVE
GLUCOSE, UA: NEGATIVE mg/dL
Hgb urine dipstick: NEGATIVE
Ketones, ur: >80 mg/dL — AB
LEUKOCYTES UA: NEGATIVE
NITRITE: NEGATIVE
PH: 6 (ref 5.0–8.0)
PROTEIN: NEGATIVE mg/dL
Specific Gravity, Urine: 1.025 (ref 1.005–1.030)
UROBILINOGEN UA: 1 mg/dL (ref 0.0–1.0)

## 2015-04-07 LAB — PROTEIN / CREATININE RATIO, URINE
Creatinine, Urine: 284 mg/dL
PROTEIN CREATININE RATIO: 0.11 (ref 0.00–0.15)
TOTAL PROTEIN, URINE: 30 mg/dL

## 2015-04-07 MED ORDER — SODIUM CHLORIDE 0.9 % IV SOLN
25.0000 mg | Freq: Once | INTRAVENOUS | Status: DC
  Filled 2015-04-07: qty 1

## 2015-04-07 NOTE — Progress Notes (Signed)
Patient states she is not going to stay for labs and IVF's. States the doctor did not explain to her the plan of care and why the labs and IV are recommended. Patient states she has been in and out of the clinic and MAU and she is sent home every time feeling the same way she came in. States she had labs drawn 2 weeks ago and knows that nothing has changed.  Attempted to explain to patient chronic HTN vs preeclampsia and how her status can change over a short period of time. Explained to patient that she has ketones in her urine and needs IVF to clear it. Also informed patient that IVF's contain phenergan to relieve her N/V. Informed patient that MD will be called to come and talk to her about the plan. Patient states she does not want MD to come and talk to her at this time, that she is going to leave and not return until her induction on May 8th. Patient removed EFM and dressed. Dr. Adrian BlackwaterStinson informed. Dr. Adrian BlackwaterStinson advises patient to stay and if she will not, she will need to sign out AMA. Patient states she will not sign form. Patient was given preeclampsia patient information prior to her leaving and encouraged to read and to tape on her refrigerator for reference. Patient left in stable condition.

## 2015-04-07 NOTE — MAU Provider Note (Signed)
History     CSN: [id number]  Arrival date and time: 04/07/15 1727   None     Chief Complaint  Patient presents with  . Contractions  . Emesis During Pregnancy  . Diarrhea   HPI  Patient is 34 y.o. [id number] 5011w2d here with complaints of nausea/vomiting/diarrhea.  Vomiting: x 4 today since 0930 until 1700, nonbilious non bloody.  Is not associated with abdominal pain.  No recent antibiotics.  No alleviating/aggravating factors.  Has been able to tolerate some fluids  Diarrhea: x6 today.  Loose stools, usually has 2 bowel movements/day.  No hematochezia  Elevated BP: has hx of chronic HTN, denies HA, no visual changes, no RUQ pain  +FM, denies LOF, VB, contractions, vaginal discharge.   Past Medical History  Diagnosis Date  . Headache   . Hypertension   . Multiple allergies     Past Surgical History  Procedure Laterality Date  . Breast surgery      had lumped removed at age 34. non cancer  . Cesarean section      C/S x 1    Family History  Problem Relation Age of Onset  . Hypertension Maternal Aunt   . Cancer Maternal Aunt   . Heart disease Maternal Grandmother     History  Substance Use Topics  . Smoking status: Never Smoker   . Smokeless tobacco: Never Used  . Alcohol Use: No    Allergies:  Allergies  Allergen Reactions  . Garlic Anaphylaxis  . Latex Hives  . Shellfish Allergy Anaphylaxis  . Vinegar [Acetic Acid] Anaphylaxis  . Aspirin Other (See Comments)    Causes blood not to clot.  . Banana Hives  . Chocolate Other (See Comments)    Causes migraines.  . Codeine Hives  . Eggs Or Egg-Derived Products Hives and Swelling    Swelling is of the tongue.  . Iodine Hives  . Peanut-Containing Drug Products Itching  . Penicillins Hives and Itching    Causes itching of the throat.  . Strawberry Hives    Causes hives in the mouth.  . Tylenol [Acetaminophen] Hives  . Citrus Rash    Causes rash around mouth.  . Pineapple Rash    Causes rash around  the mouth.    Prescriptions prior to admission  Medication Sig Dispense Refill Last Dose  . cyclobenzaprine (FLEXERIL) 10 MG tablet Take 1 tablet (10 mg total) by mouth every 8 (eight) hours as needed for muscle spasms. 30 tablet 1 Past Month at Unknown time  . labetalol (NORMODYNE) 200 MG tablet Take 1 tablet (200 mg total) by mouth 2 (two) times daily. 60 tablet 1 04/07/2015 at 0945  . triamcinolone ointment (KENALOG) 0.5 % Apply 1 application topically 2 (two) times daily.   04/07/2015 at Unknown time    Review of Systems  Constitutional: Negative for fever and chills.  HENT: Negative for congestion.   Respiratory: Negative for cough and shortness of breath.   Cardiovascular: Negative for chest pain and leg swelling.  Gastrointestinal: Positive for nausea, vomiting and diarrhea. Negative for heartburn and abdominal pain.  Genitourinary: Negative for dysuria, urgency, frequency and hematuria.  Skin: Negative for itching and rash.  Neurological: Negative for dizziness, loss of consciousness and headaches.   Physical Exam   Blood pressure 141/79, pulse 92, temperature 98 F (36.7 C), temperature source Oral, resp. rate 20, last menstrual period 07/02/2014.  Physical Exam  Constitutional: She is oriented to person, place, and time. She appears well-developed and well-nourished.  HENT:  Head: Normocephalic and atraumatic.  Eyes: Conjunctivae and EOM are normal.  Neck: Normal range of motion.  Cardiovascular: Normal rate.   Respiratory: Effort normal. No respiratory distress.  GI: Soft. Bowel sounds are normal. She exhibits no distension. There is no tenderness.  Musculoskeletal: Normal range of motion. She exhibits no edema.  Neurological: She is alert and oriented to person, place, and time. She has normal reflexes.  Skin: Skin is warm and dry. No erythema.    MAU Course  Procedures  MDM NST reactive CBC, CMP, urine protein to creatinine ratio ordered Peripheral IV 1L bolus  with phenergan  Assessment and Plan  Patient is 34 y.o. [id number] [redacted]w[redacted]d reporting nausea, vomiting, diarrhea likely secondary to gastroentirits and with elevated blood pressures - fetal kick counts reinforced - preterm labor precautions - elevated blood pressures: hx of cHTN with elev BP prior to 20w, then subsequently normal with only outliers within today's range.   Azaylea Maves ROCIO 04/07/2015, 6:26 PM   Shortly after I evaluated patient she refused all interventions, left AMA.  She declined to sign AMA form.  Did not receive IVF, phenergan, lab work  Perry Mount, MD

## 2015-04-07 NOTE — Telephone Encounter (Signed)
Patient called wondering S/S labor. Discussed S/S labor. Patient reports she has been contracting consistently every 10-20 minutes since 0930 this morning as well as experiencing vomiting and diarrhea; reports strong contractions with back involvment. Asked if patient has been remaining well hydrated. Patient reports she has tried but has vomited and is unable to keep anything down. Reports she has a high tolerance for pain and is unsure if she is contracting more than what she currently feels. Advised patient come into MAU for evaluation. Patient verbalized understanding. No further questions.

## 2015-04-07 NOTE — MAU Note (Signed)
Contractions since 0930, also vomiting & diarrhea.  Denies bleeding, has noted wetness in underwear - unsure if LOF.  a

## 2015-04-09 ENCOUNTER — Inpatient Hospital Stay (HOSPITAL_COMMUNITY)
Admission: AD | Admit: 2015-04-09 | Discharge: 2015-04-09 | Disposition: A | Discharge status: Home / Self Care | Payer: Non-veteran care | Source: Ambulatory Visit | Attending: Obstetrics & Gynecology | Admitting: Obstetrics & Gynecology

## 2015-04-09 ENCOUNTER — Encounter (HOSPITAL_COMMUNITY): Payer: Self-pay | Admitting: *Deleted

## 2015-04-09 DIAGNOSIS — Z3A37 37 weeks gestation of pregnancy: Secondary | ICD-10-CM | POA: Diagnosis not present

## 2015-04-09 DIAGNOSIS — O10013 Pre-existing essential hypertension complicating pregnancy, third trimester: Secondary | ICD-10-CM | POA: Insufficient documentation

## 2015-04-09 DIAGNOSIS — O471 False labor at or after 37 completed weeks of gestation: Secondary | ICD-10-CM | POA: Insufficient documentation

## 2015-04-09 DIAGNOSIS — Z87891 Personal history of nicotine dependence: Secondary | ICD-10-CM | POA: Insufficient documentation

## 2015-04-09 DIAGNOSIS — O479 False labor, unspecified: Secondary | ICD-10-CM | POA: Diagnosis not present

## 2015-04-09 HISTORY — DX: Gastro-esophageal reflux disease without esophagitis: K21.9

## 2015-04-09 HISTORY — DX: Unspecified abnormal cytological findings in specimens from vagina: R87.629

## 2015-04-09 HISTORY — DX: Bipolar disorder, unspecified: F31.9

## 2015-04-09 HISTORY — DX: Anemia, unspecified: D64.9

## 2015-04-09 HISTORY — DX: Depression, unspecified: F32.A

## 2015-04-09 HISTORY — DX: Major depressive disorder, single episode, unspecified: F32.9

## 2015-04-09 LAB — CBC
HCT: 34.5 % — ABNORMAL LOW (ref 36.0–46.0)
Hemoglobin: 11.4 g/dL — ABNORMAL LOW (ref 12.0–15.0)
MCH: 28.9 pg (ref 26.0–34.0)
MCHC: 33 g/dL (ref 30.0–36.0)
MCV: 87.6 fL (ref 78.0–100.0)
Platelets: 202 10*3/uL (ref 150–400)
RBC: 3.94 MIL/uL (ref 3.87–5.11)
RDW: 14.6 % (ref 11.5–15.5)
WBC: 6.5 10*3/uL (ref 4.0–10.5)

## 2015-04-09 LAB — COMPREHENSIVE METABOLIC PANEL
ALT: 21 U/L (ref 0–35)
AST: 16 U/L (ref 0–37)
Albumin: 3.2 g/dL — ABNORMAL LOW (ref 3.5–5.2)
Alkaline Phosphatase: 143 U/L — ABNORMAL HIGH (ref 39–117)
Anion gap: 9 (ref 5–15)
BUN: <5 mg/dL — ABNORMAL LOW (ref 6–23)
CO2: 18 mmol/L — ABNORMAL LOW (ref 19–32)
Calcium: 9 mg/dL (ref 8.4–10.5)
Chloride: 108 mmol/L (ref 96–112)
Creatinine, Ser: 0.42 mg/dL — ABNORMAL LOW (ref 0.50–1.10)
GFR calc Af Amer: >90 mL/min (ref >90)
GFR calc non Af Amer: >90 mL/min (ref >90)
Glucose, Bld: 86 mg/dL (ref 70–99)
Potassium: 3.1 mmol/L — ABNORMAL LOW (ref 3.5–5.1)
Sodium: 135 mmol/L (ref 135–145)
Total Bilirubin: 0.6 mg/dL (ref 0.3–1.2)
Total Protein: 7.1 g/dL (ref 6.0–8.3)

## 2015-04-09 LAB — PROTEIN / CREATININE RATIO, URINE
Creatinine, Urine: 271 mg/dL
Protein Creatinine Ratio: 0.19 mg/mg{Cre} — ABNORMAL HIGH (ref 0.00–0.15)
Total Protein, Urine: 51 mg/dL

## 2015-04-09 NOTE — Discharge Instructions (Signed)
Braxton Hicks Contractions °Contractions of the uterus can occur throughout pregnancy. Contractions are not always a sign that you are in labor.  °WHAT ARE BRAXTON HICKS CONTRACTIONS?  °Contractions that occur before labor are called Braxton Hicks contractions, or false labor. Toward the end of pregnancy (32-34 weeks), these contractions can develop more often and may become more forceful. This is not true labor because these contractions do not result in opening (dilatation) and thinning of the cervix. They are sometimes difficult to tell apart from true labor because these contractions can be forceful and people have different pain tolerances. You should not feel embarrassed if you go to the hospital with false labor. Sometimes, the only way to tell if you are in true labor is for your health care provider to look for changes in the cervix. °If there are no prenatal problems or other health problems associated with the pregnancy, it is completely safe to be sent home with false labor and await the onset of true labor. °HOW CAN YOU TELL THE DIFFERENCE BETWEEN TRUE AND FALSE LABOR? °False Labor °· The contractions of false labor are usually shorter and not as hard as those of true labor.   °· The contractions are usually irregular.   °· The contractions are often felt in the front of the lower abdomen and in the groin.   °· The contractions may go away when you walk around or change positions while lying down.   °· The contractions get weaker and are shorter lasting as time goes on.   °· The contractions do not usually become progressively stronger, regular, and closer together as with true labor.   °True Labor °· Contractions in true labor last 30-70 seconds, become very regular, usually become more intense, and increase in frequency.   °· The contractions do not go away with walking.   °· The discomfort is usually felt in the top of the uterus and spreads to the lower abdomen and low back.   °· True labor can be  determined by your health care provider with an exam. This will show that the cervix is dilating and getting thinner.   °WHAT TO REMEMBER °· Keep up with your usual exercises and follow other instructions given by your health care provider.   °· Take medicines as directed by your health care provider.   °· Keep your regular prenatal appointments.   °· Eat and drink lightly if you think you are going into labor.   °· If Braxton Hicks contractions are making you uncomfortable:   °¨ Change your position from lying down or resting to walking, or from walking to resting.   °¨ Sit and rest in a tub of warm water.   °¨ Drink 2-3 glasses of water. Dehydration may cause these contractions.   °¨ Do slow and deep breathing several times an hour.   °WHEN SHOULD I SEEK IMMEDIATE MEDICAL CARE? °Seek immediate medical care if: °· Your contractions become stronger, more regular, and closer together.   °· You have fluid leaking or gushing from your vagina.   °· You have a fever.   °· You pass blood-tinged mucus.   °· You have vaginal bleeding.   °· You have continuous abdominal pain.   °· You have low back pain that you never had before.   °· You feel your baby's head pushing down and causing pelvic pressure.   °· Your baby is not moving as much as it used to.   °Document Released: 11/28/2005 Document Revised: 12/03/2013 Document Reviewed: 09/09/2013 °[employer]® Patient Information ©2015 [employer]. This information is not intended to replace advice given to you by your health care   provider. Make sure you discuss any questions you have with your health care provider. ° °

## 2015-04-09 NOTE — MAU Note (Signed)
Says she is having Contractions irregularly. Denies bright red vaginal bleeding.  States Positive bloody show.  Positive fetal movement Denies SROM/LOF Denies any infections. PIH this pregnancy

## 2015-04-09 NOTE — MAU Provider Note (Signed)
First Provider Initiated Contact with Patient 04/09/15 1929      Chief Complaint:  Labor check  Danielle Estes is  34 y.o. [phone] at [redacted]w[redacted]d presents complaining irregular and mild contractions, some spotting.  I assumed care from [facility], CNM, who ordered labs d/t elevated BP. However, the pt is CHTN on labetalol BID. Her cervix has not changed, her contractions have stopped, and her labs were normal.  She states she doesn't have another appt in [facility], so message was left to call her to schedule one for 1 week.   Obstetrical/Gynecological History: OB History    Gravida Para Term Preterm AB TAB SAB Ectopic Multiple Living   0 0 0 0 0 0 3     Past Medical History: Past Medical History  Diagnosis Date  . Headache   . Hypertension   . Multiple allergies   . Vaginal Pap smear, abnormal   . Anemia   . Depression   . Bipolar 1 disorder   . GERD (gastroesophageal reflux disease)     Past Surgical History: Past Surgical History  Procedure Laterality Date  . Breast surgery      had lumped removed at age 15. non cancer  . Cesarean section      C/S x 1    Family History: Family History  Problem Relation Age of Onset  . Hypertension Maternal Aunt   . Cancer Maternal Aunt   . Heart disease Maternal Grandmother     Social History: History  Substance Use Topics  . Smoking status: Former Games developer  . Smokeless tobacco: Never Used  . Alcohol Use: No    Allergies:  Allergies  Allergen Reactions  . Garlic Anaphylaxis  . Latex Hives  . Peanut-Containing Drug Products Anaphylaxis and Itching  . Shellfish Allergy Anaphylaxis  . Vinegar [Acetic Acid] Anaphylaxis  . Aspirin Other (See Comments)    Causes blood not to clot.  . Banana Hives  . Chocolate Other (See Comments)    Causes migraines.  . Codeine Hives  . Eggs Or Egg-Derived Products Hives and Swelling    Swelling is of the tongue.  . Iodine Hives  . Penicillins Hives and Itching    Causes itching of the  throat.  . Strawberry Hives    Causes hives in the mouth.  . Tylenol [Acetaminophen] Hives  . Citrus Rash    Causes rash around mouth.  . Pineapple Rash    Causes rash around the mouth.    Meds:  No prescriptions prior to admission    Review of Systems   Constitutional: Negative for fever and chills Eyes: Negative for visual disturbances Respiratory: Negative for shortness of breath, dyspnea Cardiovascular: Negative for chest pain or palpitations  Gastrointestinal: Negative for vomiting, diarrhea and constipation Genitourinary: Negative for dysuria and urgency Musculoskeletal: Negative for back pain, joint pain, myalgias  Neurological: Negative for dizziness and headaches     Physical Exam  Blood pressure 142/92, pulse 95, temperature 98.1 F (36.7 C), temperature source Oral, resp. rate 18, height  (1.753 m), weight 114.306 kg (252 lb), last menstrual period 07/02/2014. GENERAL: Well-developed, well-nourished female in no acute distress.  LUNGS: Clear to auscultation bilaterally.  HEART: Regular rate and rhythm. ABDOMEN: Soft, nontender, nondistended, gravid.  EXTREMITIES: Nontender, no edema, 2+ distal pulses. DTR's 2+ CERVICAL EXAM: Dilatation 1cm   Effacement thick  Station -3 per RN   Presentation: cephalic FHT:  Baseline rate 140 bpm   Variability moderate  Accelerations  present   Decelerations none Contractions: Every 0 mins   Labs: Results for orders placed or performed during the hospital encounter of 04/09/15 (from the past 24 hour(s))  CBC   Collection Time: 04/09/15  7:57 PM  Result Value Ref Range   WBC 6.5 4.0 - 10.5 K/uL   RBC 3.94 3.87 - 5.11 MIL/uL   Hemoglobin 11.4 (L) 12.0 - 15.0 g/dL   HCT 16.134.5 (L) 09.636.0 - 04.546.0 %   MCV 87.6 78.0 - 100.0 fL   MCH 28.9 26.0 - 34.0 pg   MCHC 33.0 30.0 - 36.0 g/dL   RDW 40.914.6 81.111.5 - 91.415.5 %   Platelets 202 150 - 400 K/uL  Comprehensive metabolic panel   Collection Time: 04/09/15  7:57 PM  Result Value Ref  Range   Sodium 135 135 - 145 mmol/L   Potassium 3.1 (L) 3.5 - 5.1 mmol/L   Chloride 108 96 - 112 mmol/L   CO2 18 (L) 19 - 32 mmol/L   Glucose, Bld 86 70 - 99 mg/dL   BUN <5 (L) 6 - 23 mg/dL   Creatinine, Ser 7.820.42 (L) 0.50 - 1.10 mg/dL   Calcium 9.0 8.4 - 95.610.5 mg/dL   Total Protein 7.1 6.0 - 8.3 g/dL   Albumin 3.2 (L) 3.5 - 5.2 g/dL   AST 16 0 - 37 U/L   ALT 21 0 - 35 U/L   Alkaline Phosphatase 143 (H) 39 - 117 U/L   Total Bilirubin 0.6 0.3 - 1.2 mg/dL   GFR calc non Af Amer >90 >90 mL/min   GFR calc Af Amer >90 >90 mL/min   Anion gap 9 5 - 15  Protein / creatinine ratio, urine   Collection Time: 04/09/15  8:00 PM  Result Value Ref Range   Creatinine, Urine 271.00 mg/dL   Total Protein, Urine 51 mg/dL   Protein Creatinine Ratio 0.19 (H) 0.00 - 0.15 mg/mg[Cre]   Imaging Studies:  No results found.  Assessment: Danielle Estes is  34 y.o. [phone] at 693w4d presents with false labor.  Plan: DC h9ome. F/U one week [facility]  CRESENZO-DISHMAN,Temesha Queener 4/29/20166:52 AM

## 2015-04-09 NOTE — MAU Note (Signed)
PT  SAYS IS AN INDUCTION ON 5-9.  HAS BLOODY SHOW-  STARTED YESTERDAY.   VOMITING   STARTED  ON Tuesday    AND  DIARRHEA-   WATERY .  NO FEVER  AT HOME    [facility]    DENIES HSV AND MRSA.  GBS- NEG.    VE  IN MAU   1 CM-   ON   3 WEEKS   AGO.

## 2015-04-10 ENCOUNTER — Ambulatory Visit (HOSPITAL_COMMUNITY): Payer: Non-veteran care

## 2015-04-10 ENCOUNTER — Telehealth: Payer: Self-pay | Admitting: General Practice

## 2015-04-10 ENCOUNTER — Other Ambulatory Visit (HOSPITAL_COMMUNITY): Payer: Non-veteran care

## 2015-04-10 NOTE — Progress Notes (Signed)
Pt called the front desk and informed me that she went to MAU yesterday due to having contractions.  Pt stated that the provider who seen her wanted her to make her an appt for a BP check here at the Clinics but then the provider stated that she has chronic hypertension and the appt was not needed.  Pt stated that she is continuing to have contractions as she did yesterday.  Reading the pt's chart from MAU pt was checked and was informed of labor precautions.  Pt wanted to know if she could get her appt today.  I explained to the pt that she is having braxton hicks contractions and re-stated labor precautions.  I also explained to the pt that the providers did not have an appt today so I would have to recommend her to go to MAU but she could get an appt with a nurse for BP check.  Pt stated "ok, I wanted to be seen for contractions".  I again explained labor precautions.  Pt stated understanding with no further questions.
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
## 2015-04-16 ENCOUNTER — Ambulatory Visit (INDEPENDENT_AMBULATORY_CARE_PROVIDER_SITE_OTHER): Payer: Non-veteran care | Admitting: Obstetrics & Gynecology

## 2015-04-16 VITALS — BP 136/84 | HR 84 | Wt 257.5 lb

## 2015-04-16 DIAGNOSIS — O163 Unspecified maternal hypertension, third trimester: Secondary | ICD-10-CM | POA: Diagnosis not present

## 2015-04-16 DIAGNOSIS — O0993 Supervision of high risk pregnancy, unspecified, third trimester: Secondary | ICD-10-CM

## 2015-04-16 LAB — POCT URINALYSIS DIP (DEVICE)
Bilirubin Urine: NEGATIVE
GLUCOSE, UA: NEGATIVE mg/dL
Hgb urine dipstick: NEGATIVE
Ketones, ur: NEGATIVE mg/dL
NITRITE: NEGATIVE
Protein, ur: 30 mg/dL — AB
Specific Gravity, Urine: 1.025 (ref 1.005–1.030)
UROBILINOGEN UA: 2 mg/dL — AB (ref 0.0–1.0)
pH: 7 (ref 5.0–8.0)

## 2015-04-16 LAB — OB RESULTS CONSOLE GC/CHLAMYDIA
Chlamydia: NEGATIVE
Gonorrhea: NEGATIVE

## 2015-04-16 LAB — OB RESULTS CONSOLE GBS: GBS: NEGATIVE

## 2015-04-16 NOTE — Progress Notes (Signed)
Urobilinogen 2.0, Leukocytes: Moderate

## 2015-04-16 NOTE — Progress Notes (Signed)
IOL scheduled 5/8 @ 730

## 2015-04-16 NOTE — Progress Notes (Signed)
GBS GC CT today, IOL in 3 days NST reactive

## 2015-04-16 NOTE — Patient Instructions (Signed)
Labor Induction  Labor induction is when steps are taken to cause a pregnant woman to begin the labor process. Most women go into labor on their own between 37 weeks and 42 weeks of the pregnancy. When this does not happen or when there is a medical need, methods may be used to induce labor. Labor induction causes a pregnant woman's uterus to contract. It also causes the cervix to soften (ripen), open (dilate), and thin out (efface). Usually, labor is not induced before 39 weeks of the pregnancy unless there is a problem with the baby or mother.  Before inducing labor, your health care provider will consider a number of factors, including the following:  The medical condition of you and the baby.   How many weeks along you are.   The status of the baby's lung maturity.   The condition of the cervix.   The position of the baby.  WHAT ARE THE REASONS FOR LABOR INDUCTION? Labor may be induced for the following reasons:  The health of the baby or mother is at risk.   The pregnancy is overdue by 1 week or more.   The water breaks but labor does not start on its own.   The mother has a health condition or serious illness, such as high blood pressure, infection, placental abruption, or diabetes.  The amniotic fluid amounts are low around the baby.   The baby is distressed.  Convenience or wanting the baby to be born on a certain date is not a reason for inducing labor. WHAT METHODS ARE USED FOR LABOR INDUCTION? Several methods of labor induction may be used, such as:   Prostaglandin medicine. This medicine causes the cervix to dilate and ripen. The medicine will also start contractions. It can be taken by mouth or by inserting a suppository into the vagina.   Inserting a thin tube (catheter) with a balloon on the end into the vagina to dilate the cervix. Once inserted, the balloon is expanded with water, which causes the cervix to open.   Stripping the membranes. Your health  care provider separates amniotic sac tissue from the cervix, causing the cervix to be stretched and causing the release of a hormone called progesterone. This may cause the uterus to contract. It is often done during an office visit. You will be sent home to wait for the contractions to begin. You will then come in for an induction.   Breaking the water. Your health care provider makes a hole in the amniotic sac using a small instrument. Once the amniotic sac breaks, contractions should begin. This may still take hours to see an effect.   Medicine to trigger or strengthen contractions. This medicine is given through an IV access tube inserted into a vein in your arm.  All of the methods of induction, besides stripping the membranes, will be done in the hospital. Induction is done in the hospital so that you and the baby can be carefully monitored.  HOW LONG DOES IT TAKE FOR LABOR TO BE INDUCED? Some inductions can take up to 2-3 days. Depending on the cervix, it usually takes less time. It takes longer when you are induced early in the pregnancy or if this is your first pregnancy. If a mother is still pregnant and the induction has been going on for 2-3 days, either the mother will be sent home or a cesarean delivery will be needed. WHAT ARE THE RISKS ASSOCIATED WITH LABOR INDUCTION? Some of the risks of induction   include:   Changes in fetal heart rate, such as too high, too low, or erratic.   Fetal distress.   Chance of infection for the mother and baby.   Increased chance of having a cesarean delivery.   Breaking off (abruption) of the placenta from the uterus (rare).   Uterine rupture (very rare).  When induction is needed for medical reasons, the benefits of induction may outweigh the risks. WHAT ARE SOME REASONS FOR NOT INDUCING LABOR? Labor induction should not be done if:   It is shown that your baby does not tolerate labor.   You have had previous surgeries on your  uterus, such as a myomectomy or the removal of fibroids.   Your placenta lies very low in the uterus and blocks the opening of the cervix (placenta previa).   Your baby is not in a head-down position.   The umbilical cord drops down into the birth canal in front of the baby. This could cut off the baby's blood and oxygen supply.   You have had a previous cesarean delivery.   There are unusual circumstances, such as the baby being extremely premature.  Document Released: 04/19/2007 Document Revised: 07/31/2013 Document Reviewed: 06/27/2013 Exit[employer] Patient Information 2015 [employer]. This information is not intended to replace advice given to you by your health care provider. Make sure you discuss any questions you have with your health care provider.  

## 2015-04-17 LAB — GC/CHLAMYDIA PROBE AMP
CT Probe RNA: NEGATIVE
GC Probe RNA: NEGATIVE

## 2015-04-18 ENCOUNTER — Encounter (HOSPITAL_COMMUNITY): Payer: Self-pay | Admitting: *Deleted

## 2015-04-18 ENCOUNTER — Inpatient Hospital Stay (HOSPITAL_COMMUNITY)
Admission: AD | Admit: 2015-04-18 | Discharge: 2015-04-21 | DRG: 765 | Disposition: A | Discharge status: Home / Self Care | Payer: Non-veteran care | Source: Ambulatory Visit | Attending: Obstetrics and Gynecology | Admitting: Obstetrics and Gynecology

## 2015-04-18 DIAGNOSIS — O99324 Drug use complicating childbirth: Secondary | ICD-10-CM | POA: Diagnosis present

## 2015-04-18 DIAGNOSIS — Z91010 Allergy to peanuts: Secondary | ICD-10-CM | POA: Diagnosis not present

## 2015-04-18 DIAGNOSIS — Z91012 Allergy to eggs: Secondary | ICD-10-CM | POA: Diagnosis not present

## 2015-04-18 DIAGNOSIS — O99344 Other mental disorders complicating childbirth: Secondary | ICD-10-CM | POA: Diagnosis present

## 2015-04-18 DIAGNOSIS — O10019 Pre-existing essential hypertension complicating pregnancy, unspecified trimester: Secondary | ICD-10-CM | POA: Diagnosis present

## 2015-04-18 DIAGNOSIS — O113 Pre-existing hypertension with pre-eclampsia, third trimester: Secondary | ICD-10-CM | POA: Diagnosis present

## 2015-04-18 DIAGNOSIS — Z91018 Allergy to other foods: Secondary | ICD-10-CM

## 2015-04-18 DIAGNOSIS — Z91040 Latex allergy status: Secondary | ICD-10-CM | POA: Diagnosis not present

## 2015-04-18 DIAGNOSIS — K219 Gastro-esophageal reflux disease without esophagitis: Secondary | ICD-10-CM | POA: Diagnosis present

## 2015-04-18 DIAGNOSIS — Z3A38 38 weeks gestation of pregnancy: Secondary | ICD-10-CM | POA: Diagnosis present

## 2015-04-18 DIAGNOSIS — F1290 Cannabis use, unspecified, uncomplicated: Secondary | ICD-10-CM | POA: Diagnosis present

## 2015-04-18 DIAGNOSIS — Z91013 Allergy to seafood: Secondary | ICD-10-CM | POA: Diagnosis not present

## 2015-04-18 DIAGNOSIS — Z888 Allergy status to other drugs, medicaments and biological substances status: Secondary | ICD-10-CM | POA: Diagnosis not present

## 2015-04-18 DIAGNOSIS — Z885 Allergy status to narcotic agent status: Secondary | ICD-10-CM

## 2015-04-18 DIAGNOSIS — O9962 Diseases of the digestive system complicating childbirth: Secondary | ICD-10-CM | POA: Diagnosis present

## 2015-04-18 DIAGNOSIS — O119 Pre-existing hypertension with pre-eclampsia, unspecified trimester: Secondary | ICD-10-CM | POA: Diagnosis present

## 2015-04-18 DIAGNOSIS — Z302 Encounter for sterilization: Secondary | ICD-10-CM

## 2015-04-18 DIAGNOSIS — Z87891 Personal history of nicotine dependence: Secondary | ICD-10-CM | POA: Diagnosis not present

## 2015-04-18 DIAGNOSIS — O3421 Maternal care for scar from previous cesarean delivery: Secondary | ICD-10-CM | POA: Diagnosis present

## 2015-04-18 DIAGNOSIS — F329 Major depressive disorder, single episode, unspecified: Secondary | ICD-10-CM | POA: Diagnosis present

## 2015-04-18 DIAGNOSIS — N858 Other specified noninflammatory disorders of uterus: Secondary | ICD-10-CM | POA: Diagnosis present

## 2015-04-18 DIAGNOSIS — O9902 Anemia complicating childbirth: Secondary | ICD-10-CM | POA: Diagnosis present

## 2015-04-18 DIAGNOSIS — Z880 Allergy status to penicillin: Secondary | ICD-10-CM

## 2015-04-18 DIAGNOSIS — Z9119 Patient's noncompliance with other medical treatment and regimen: Secondary | ICD-10-CM | POA: Diagnosis present

## 2015-04-18 DIAGNOSIS — O1092 Unspecified pre-existing hypertension complicating childbirth: Secondary | ICD-10-CM | POA: Diagnosis present

## 2015-04-18 DIAGNOSIS — O358XX0 Maternal care for other (suspected) fetal abnormality and damage, not applicable or unspecified: Secondary | ICD-10-CM | POA: Diagnosis present

## 2015-04-18 LAB — COMPREHENSIVE METABOLIC PANEL
ALBUMIN: 2.8 g/dL — AB (ref 3.5–5.0)
ALK PHOS: 153 U/L — AB (ref 38–126)
ALT: 14 U/L (ref 14–54)
AST: 18 U/L (ref 15–41)
Anion gap: 7 (ref 5–15)
BUN: 6 mg/dL (ref 6–20)
CO2: 20 mmol/L — ABNORMAL LOW (ref 22–32)
Calcium: 8.9 mg/dL (ref 8.9–10.3)
Chloride: 110 mmol/L (ref 101–111)
Creatinine, Ser: 0.42 mg/dL — ABNORMAL LOW (ref 0.44–1.00)
GFR calc Af Amer: >60 mL/min (ref >60)
GFR calc non Af Amer: >60 mL/min (ref >60)
Glucose, Bld: 85 mg/dL (ref 70–99)
POTASSIUM: 4.1 mmol/L (ref 3.5–5.1)
Sodium: 137 mmol/L (ref 135–145)
TOTAL PROTEIN: 6.1 g/dL — AB (ref 6.5–8.1)
Total Bilirubin: 0.5 mg/dL (ref 0.3–1.2)

## 2015-04-18 LAB — URINALYSIS, ROUTINE W REFLEX MICROSCOPIC
Bilirubin Urine: NEGATIVE
GLUCOSE, UA: NEGATIVE mg/dL
HGB URINE DIPSTICK: NEGATIVE
KETONES UR: NEGATIVE mg/dL
Nitrite: NEGATIVE
Protein, ur: NEGATIVE mg/dL
Specific Gravity, Urine: 1.02 (ref 1.005–1.030)
Urobilinogen, UA: 0.2 mg/dL (ref 0.0–1.0)
pH: 7 (ref 5.0–8.0)

## 2015-04-18 LAB — CBC
HEMATOCRIT: 34.3 % — AB (ref 36.0–46.0)
HEMOGLOBIN: 11 g/dL — AB (ref 12.0–15.0)
MCH: 28.6 pg (ref 26.0–34.0)
MCHC: 32.1 g/dL (ref 30.0–36.0)
MCV: 89.3 fL (ref 78.0–100.0)
Platelets: 187 10*3/uL (ref 150–400)
RBC: 3.84 MIL/uL — ABNORMAL LOW (ref 3.87–5.11)
RDW: 15 % (ref 11.5–15.5)
WBC: 7.6 10*3/uL (ref 4.0–10.5)

## 2015-04-18 LAB — URINE MICROSCOPIC-ADD ON

## 2015-04-18 LAB — TYPE AND SCREEN
ABO/RH(D): O POS
Antibody Screen: NEGATIVE

## 2015-04-18 LAB — PROTEIN / CREATININE RATIO, URINE
CREATININE, URINE: 129 mg/dL
Protein Creatinine Ratio: 0.15 mg/mg{Cre} (ref 0.00–0.15)
Total Protein, Urine: 19 mg/dL

## 2015-04-18 LAB — CULTURE, BETA STREP (GROUP B ONLY)

## 2015-04-18 MED ORDER — TERBUTALINE SULFATE 1 MG/ML IJ SOLN: 0.2500 mg | Freq: Once | INTRAMUSCULAR | Status: AC | PRN

## 2015-04-18 MED ORDER — FLEET ENEMA 7-19 GM/118ML RE ENEM: 1.0000 | ENEMA | RECTAL | Status: DC | PRN

## 2015-04-18 MED ORDER — LABETALOL HCL 5 MG/ML IV SOLN: 20.0000 mg | INTRAVENOUS | Status: DC | PRN

## 2015-04-18 MED ORDER — LACTATED RINGERS IV SOLN
INTRAVENOUS | Status: DC
  Administered 2015-04-19 (×4): via INTRAVENOUS

## 2015-04-18 MED ORDER — PROMETHAZINE HCL 25 MG/ML IJ SOLN
12.5000 mg | Freq: Four times a day (QID) | INTRAMUSCULAR | Status: DC | PRN
Start: 2015-04-18 — End: 2015-04-21
  Administered 2015-04-18 – 2015-04-19 (×2): 12.5 mg via INTRAVENOUS
  Filled 2015-04-18 (×2): qty 1

## 2015-04-18 MED ORDER — OXYTOCIN 40 UNITS IN LACTATED RINGERS INFUSION - SIMPLE MED
62.5000 mL/h | INTRAVENOUS | Status: DC
Start: 2015-04-18 — End: 2015-04-19

## 2015-04-18 MED ORDER — CITRIC ACID-SODIUM CITRATE 334-500 MG/5ML PO SOLN
30.0000 mL | ORAL | Status: DC | PRN
  Administered 2015-04-19: 30 mL via ORAL
  Filled 2015-04-18: qty 15

## 2015-04-18 MED ORDER — OXYTOCIN BOLUS FROM INFUSION
500.0000 mL | INTRAVENOUS | Status: DC
Start: 2015-04-18 — End: 2015-04-19

## 2015-04-18 MED ORDER — LIDOCAINE HCL (PF) 1 % IJ SOLN: 30.0000 mL | INTRAMUSCULAR | Status: DC | PRN

## 2015-04-18 MED ORDER — HYDRALAZINE HCL 20 MG/ML IJ SOLN: 10.0000 mg | Freq: Once | INTRAMUSCULAR | Status: AC | PRN

## 2015-04-18 MED ORDER — LACTATED RINGERS IV SOLN
500.0000 mL | INTRAVENOUS | Status: DC | PRN
  Administered 2015-04-19: 14:00:00 via INTRAVENOUS

## 2015-04-18 MED ORDER — LACTATED RINGERS IV BOLUS (SEPSIS)
1000.0000 mL | Freq: Once | INTRAVENOUS | Status: AC
  Administered 2015-04-18: 1000 mL via INTRAVENOUS

## 2015-04-18 MED ORDER — FENTANYL CITRATE (PF) 100 MCG/2ML IJ SOLN
100.0000 ug | Freq: Once | INTRAMUSCULAR | Status: AC
  Administered 2015-04-18: 100 ug via INTRAVENOUS
  Filled 2015-04-18: qty 2

## 2015-04-18 MED ORDER — LABETALOL HCL 200 MG PO TABS
200.0000 mg | ORAL_TABLET | Freq: Two times a day (BID) | ORAL | Status: DC
  Administered 2015-04-18: 200 mg via ORAL
  Filled 2015-04-18 (×4): qty 1

## 2015-04-18 MED ORDER — ONDANSETRON HCL 4 MG/2ML IJ SOLN
4.0000 mg | Freq: Four times a day (QID) | INTRAMUSCULAR | Status: DC | PRN
Start: 2015-04-18 — End: 2015-04-19
  Administered 2015-04-18 – 2015-04-19 (×2): 4 mg via INTRAVENOUS
  Filled 2015-04-18: qty 2

## 2015-04-18 MED ORDER — OXYTOCIN 40 UNITS IN LACTATED RINGERS INFUSION - SIMPLE MED
1.0000 m[IU]/min | INTRAVENOUS | Status: DC
  Administered 2015-04-18: 2 m[IU]/min via INTRAVENOUS
  Filled 2015-04-18: qty 1000

## 2015-04-18 NOTE — MAU Note (Addendum)
Contractions since last night. Patient states she is on Labetalol, but has only taken one dose today.

## 2015-04-18 NOTE — H&P (Signed)
HPI: Danielle Estes is a 34 y.o. year old [id number] female at 6847w6d weeks gestation who presents to MAU reporting Labor. No cervical change from previous exam, but BP 169/100. Pt has CHTN on Labetalol. Did not take second dose yet today.  Denies HA, vision changes or epigastric pain. [facility]. 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 [facility] but pt non-compliant w/ prenatal care. Hasn't been keeping prenatal appointments or getting growth US's (last US 02/20/15). Saw Dentistgenetic counselor and MFM. NIPS normal. Declined Amnio and fetal MRI.   Hx C/S 2009 for NRFHTs. SVD x 2. Desires TOLAC. Consent scanned under Media tab.   Clinic  [facility] - HTN  Dating 12 week US  Genetic Screen 1 Screen:             AFP:              Quad:            NIPS:Normal  Anatomic US Female-Danielle Estes Pouch cyst vs arachnoid  GTT Early:               Third trimester: 111  TDaP vaccine  01/21/15  Flu vaccine  No-egg allergy  GBS  Neg  Contraception  BTL  Baby Food  Breast/bottle  Circumcision  IP  Pediatrician    Support Person    Maternal Medical History:  Reason for admission: Nausea.    OB History    Gravida Para Term Preterm AB TAB SAB Ectopic Multiple Living   4 3 3  0 0 0 0 0 0 3     Past Medical History  Diagnosis Date  . Headache   . Hypertension   . Multiple allergies   . Vaginal Pap smear, abnormal   . Anemia   . Depression   . Bipolar 1 disorder   . GERD (gastroesophageal reflux disease)    Past Surgical History  Procedure Laterality Date  . Cesarean section      C/S x 1  . Breast surgery      had lumped removed at age 34. non cancer   Family History: family history includes Cancer in her maternal aunt; Heart disease in her maternal grandmother; Hypertension in her maternal aunt. Social History:  reports that she has quit smoking. She has never used smokeless tobacco. She reports that she uses illicit drugs (Marijuana). She reports that she does not drink  alcohol.   Prenatal Transfer Tool  Maternal Diabetes: No Genetic Screening: Normal Maternal Ultrasounds/Referrals: Abnormal:  Findings:   Other: Fetal Ultrasounds or other Referrals:  Referred to Materal Fetal Medicine  Maternal Substance Abuse:  No Significant Maternal Medications:  None Significant Maternal Lab Results:  Lab values include: Group B Strep negative Other Comments:  Hyposplasia of inferior vermis. Normal NIPS. Declined amnio or fetal MRI. Non-compliant w/ PNC, growth US's. CHTN w/ superimposed pre-E (severe-range BP)  Review of Systems  Constitutional: Negative for fever and chills.  Eyes: Negative for blurred vision.  Gastrointestinal: Positive for vomiting and abdominal pain (contractions). Negative for nausea.  Genitourinary:       Neg for vaginal bleeding or LOF  Neurological: Negative for headaches.    Dilation: 1.5 Effacement (%): 60 Station: -3 Exam by:: S. Carrera, RNC Blood pressure 162/99, pulse 94, temperature 98.2 F (36.8 C), temperature source Oral, resp. rate 18, last menstrual period 07/02/2014.  Patient Vitals for the past 24 hrs:  BP Temp Temp src Pulse Resp Height Weight  04/18/15 2144 - - - - -  5\' 9"  (1.753 m) 257 lb 8 oz (116.801 kg)  04/18/15 2137 (!) 141/94 mmHg - - 92 - - -  04/18/15 2102 162/99 mmHg - - 94 - - -  04/18/15 2052 (!) 166/102 mmHg - - 96 - - -  04/18/15 2042 169/100 mmHg - - 97 - - -  04/18/15 2035 143/93 mmHg - - 100 - - -  04/18/15 2027 159/84 mmHg 98.2 F (36.8 C) Oral 102 18 - -    Maternal Exam:  Uterine Assessment: Contraction strength is moderate.  Contraction frequency is irregular.   Abdomen: Patient reports no abdominal tenderness. Fundal height is AGA.   Fetal presentation: vertex  Introitus: Normal vulva. Normal vagina.  Pelvis: adequate for delivery.   Cervix: Cervix evaluated by digital exam.     Fetal Exam Fetal Monitor Review: Mode: ultrasound.   Baseline rate: 130.  Variability: moderate (6-25  bpm).   Pattern: accelerations present and no decelerations.    Fetal State Assessment: Category I - tracings are normal.     Physical Exam  Nursing note and vitals reviewed. Constitutional: She is oriented to person, place, and time. She appears well-developed and well-nourished. She appears distressed.  HENT:  Head: Normocephalic.  Eyes: Conjunctivae are normal.  Cardiovascular: Normal rate, regular rhythm and normal heart sounds.   Respiratory: Effort normal and breath sounds normal. No respiratory distress.  GI: Soft. There is no tenderness.  Genitourinary: Vagina normal.  Musculoskeletal: She exhibits no edema.  Neurological: She is alert and oriented to person, place, and time. She has normal reflexes.  Skin: Skin is warm and dry.  Psychiatric: She has a normal mood and affect.    Prenatal labs: ABO, Rh: O/Positive/-- (11/02 0000) Antibody: Negative (11/02 0000) Rubella: Immune (11/02 0000) RPR: NON REAC (02/10 1555)  HBsAg: Negative (11/02 0000)  HIV: NONREACTIVE (02/10 1555)  GBS: Negative (05/05 0000)   Assessment: 1. Labor: Early 2. Fetal Wellbeing: Category I  3. Pain Control: Fentanyl 4. GBS: Neg 5. 38.6 week IUP 6. CHTN w/ Superimposed Pre-E based on severe-range BP 7. Fetal cerebellar inferior vermian hypoplasia 8. Non-compliance w/ prenatal care and antenatal testing  Plan:  1. Admit to BS per consult with MD 2. Routine L&D orders 3. Analgesia/anesthesia PRN  4. Augment PRN 5. Continue PO Labetalol and start IV labetalol  6. Mag Sulfate if severe-range BP's continue after fentanyl   [name] 04/18/2015, 9:39 PM

## 2015-04-18 NOTE — MAU Note (Signed)
[facility] Will go to [facility] 168

## 2015-04-19 ENCOUNTER — Inpatient Hospital Stay (HOSPITAL_COMMUNITY): Payer: Non-veteran care

## 2015-04-19 ENCOUNTER — Inpatient Hospital Stay (HOSPITAL_COMMUNITY): Admission: RE | Admit: 2015-04-19 | Payer: Non-veteran care | Source: Ambulatory Visit

## 2015-04-19 ENCOUNTER — Encounter (HOSPITAL_COMMUNITY): Payer: Self-pay | Admitting: *Deleted

## 2015-04-19 ENCOUNTER — Encounter (HOSPITAL_COMMUNITY)
Admission: AD | Disposition: A | Discharge status: Home / Self Care | Payer: Self-pay | Source: Ambulatory Visit | Attending: Obstetrics and Gynecology

## 2015-04-19 DIAGNOSIS — O1092 Unspecified pre-existing hypertension complicating childbirth: Secondary | ICD-10-CM

## 2015-04-19 DIAGNOSIS — Z3A38 38 weeks gestation of pregnancy: Secondary | ICD-10-CM

## 2015-04-19 DIAGNOSIS — O3421 Maternal care for scar from previous cesarean delivery: Secondary | ICD-10-CM

## 2015-04-19 DIAGNOSIS — Z302 Encounter for sterilization: Secondary | ICD-10-CM

## 2015-04-19 LAB — RPR: RPR Ser Ql: NONREACTIVE

## 2015-04-19 LAB — ABO/RH: ABO/RH(D): O POS

## 2015-04-19 SURGERY — Surgical Case
Anesthesia: Spinal

## 2015-04-19 MED ORDER — BUPIVACAINE HCL (PF) 0.5 % IJ SOLN
INTRAMUSCULAR | Status: AC
  Filled 2015-04-19: qty 30

## 2015-04-19 MED ORDER — FENTANYL CITRATE (PF) 250 MCG/5ML IJ SOLN
INTRAMUSCULAR | Status: AC
  Filled 2015-04-19: qty 5

## 2015-04-19 MED ORDER — PROPOFOL 10 MG/ML IV BOLUS
INTRAVENOUS | Status: DC | PRN
  Administered 2015-04-19: 200 mg via INTRAVENOUS

## 2015-04-19 MED ORDER — CLINDAMYCIN PHOSPHATE 900 MG/50ML IV SOLN: 900.0000 mg | Freq: Once | INTRAVENOUS | Status: DC

## 2015-04-19 MED ORDER — SIMETHICONE 80 MG PO CHEW
80.0000 mg | CHEWABLE_TABLET | ORAL | Status: DC | PRN
  Administered 2015-04-21: 80 mg via ORAL
  Filled 2015-04-19: qty 1

## 2015-04-19 MED ORDER — PHENYLEPHRINE 8 MG IN D5W 100 ML (0.08MG/ML) PREMIX OPTIME
INJECTION | INTRAVENOUS | Status: DC | PRN
  Administered 2015-04-19: 20 ug/min via INTRAVENOUS

## 2015-04-19 MED ORDER — FENTANYL CITRATE (PF) 100 MCG/2ML IJ SOLN
INTRAMUSCULAR | Status: DC | PRN
Start: 2015-04-19 — End: 2015-04-19
  Administered 2015-04-19: 50 ug via INTRAVENOUS
  Administered 2015-04-19: 30 ug via INTRAVENOUS
  Administered 2015-04-19 (×3): 50 ug via INTRAVENOUS
  Administered 2015-04-19: 20 ug via INTRATHECAL
  Administered 2015-04-19 (×2): 50 ug via INTRAVENOUS

## 2015-04-19 MED ORDER — OXYCODONE-ACETAMINOPHEN 5-325 MG PO TABS: 2.0000 | ORAL_TABLET | ORAL | Status: DC | PRN

## 2015-04-19 MED ORDER — BUPIVACAINE IN DEXTROSE 0.75-8.25 % IT SOLN
INTRATHECAL | Status: DC | PRN
  Administered 2015-04-19: 1.8 mL via INTRATHECAL

## 2015-04-19 MED ORDER — HYDROMORPHONE 0.3 MG/ML IV SOLN
INTRAVENOUS | Status: DC
  Administered 2015-04-19: 0.999 mg via INTRAVENOUS
  Administered 2015-04-19: 18:00:00 via INTRAVENOUS
  Administered 2015-04-20: 1.59 mg via INTRAVENOUS
  Administered 2015-04-20: 0.599 mg via INTRAVENOUS
  Administered 2015-04-20: 10 mL via INTRAVENOUS
  Filled 2015-04-19: qty 25

## 2015-04-19 MED ORDER — FENTANYL CITRATE (PF) 100 MCG/2ML IJ SOLN
INTRAMUSCULAR | Status: AC
  Filled 2015-04-19: qty 2

## 2015-04-19 MED ORDER — SCOPOLAMINE 1 MG/3DAYS TD PT72
MEDICATED_PATCH | TRANSDERMAL | Status: AC
  Filled 2015-04-19: qty 1

## 2015-04-19 MED ORDER — NALBUPHINE HCL 10 MG/ML IJ SOLN: 5.0000 mg | Freq: Once | INTRAMUSCULAR | Status: AC | PRN

## 2015-04-19 MED ORDER — PRENATAL MULTIVITAMIN CH
1.0000 | ORAL_TABLET | Freq: Every day | ORAL | Status: DC
  Filled 2015-04-19 (×2): qty 1

## 2015-04-19 MED ORDER — LACTATED RINGERS IV SOLN
40.0000 [IU] | INTRAVENOUS | Status: DC | PRN
  Administered 2015-04-19: 40 [IU] via INTRAVENOUS

## 2015-04-19 MED ORDER — MIDAZOLAM HCL 2 MG/2ML IJ SOLN
INTRAMUSCULAR | Status: AC
  Filled 2015-04-19: qty 2

## 2015-04-19 MED ORDER — OXYTOCIN 10 UNIT/ML IJ SOLN
INTRAMUSCULAR | Status: AC
  Filled 2015-04-19: qty 4

## 2015-04-19 MED ORDER — IBUPROFEN 600 MG PO TABS
600.0000 mg | ORAL_TABLET | Freq: Four times a day (QID) | ORAL | Status: DC
Start: 2015-04-19 — End: 2015-04-21
  Filled 2015-04-19: qty 1

## 2015-04-19 MED ORDER — PROPOFOL 10 MG/ML IV BOLUS
INTRAVENOUS | Status: AC
  Filled 2015-04-19: qty 20

## 2015-04-19 MED ORDER — SCOPOLAMINE 1 MG/3DAYS TD PT72
1.0000 | MEDICATED_PATCH | Freq: Once | TRANSDERMAL | Status: DC
Start: 2015-04-19 — End: 2015-04-21
  Filled 2015-04-19: qty 1

## 2015-04-19 MED ORDER — MEPERIDINE HCL 25 MG/ML IJ SOLN: 6.2500 mg | INTRAMUSCULAR | Status: DC | PRN

## 2015-04-19 MED ORDER — DIPHENHYDRAMINE HCL 25 MG PO CAPS: 25.0000 mg | ORAL_CAPSULE | ORAL | Status: DC | PRN

## 2015-04-19 MED ORDER — ZOLPIDEM TARTRATE 5 MG PO TABS: 5.0000 mg | ORAL_TABLET | Freq: Every evening | ORAL | Status: DC | PRN

## 2015-04-19 MED ORDER — LANOLIN HYDROUS EX OINT: 1.0000 "application " | TOPICAL_OINTMENT | CUTANEOUS | Status: DC | PRN

## 2015-04-19 MED ORDER — PHENYLEPHRINE 8 MG IN D5W 100 ML (0.08MG/ML) PREMIX OPTIME
INJECTION | INTRAVENOUS | Status: AC
  Filled 2015-04-19: qty 100

## 2015-04-19 MED ORDER — DIBUCAINE 1 % RE OINT: 1.0000 "application " | TOPICAL_OINTMENT | RECTAL | Status: DC | PRN

## 2015-04-19 MED ORDER — NALOXONE HCL 0.4 MG/ML IJ SOLN: 0.4000 mg | INTRAMUSCULAR | Status: DC | PRN

## 2015-04-19 MED ORDER — OXYCODONE-ACETAMINOPHEN 5-325 MG PO TABS: 1.0000 | ORAL_TABLET | ORAL | Status: DC | PRN

## 2015-04-19 MED ORDER — SUCCINYLCHOLINE CHLORIDE 20 MG/ML IJ SOLN
INTRAMUSCULAR | Status: DC | PRN
  Administered 2015-04-19: 200 mg via INTRAVENOUS

## 2015-04-19 MED ORDER — SODIUM CHLORIDE 0.9 % IJ SOLN: 3.0000 mL | INTRAMUSCULAR | Status: DC | PRN

## 2015-04-19 MED ORDER — MORPHINE SULFATE (PF) 0.5 MG/ML IJ SOLN
INTRAMUSCULAR | Status: DC | PRN
  Administered 2015-04-19: .5 mg via EPIDURAL
  Administered 2015-04-19: .8 mg via EPIDURAL
  Administered 2015-04-19: .5 mg via EPIDURAL
  Administered 2015-04-19: .2 mg via INTRATHECAL

## 2015-04-19 MED ORDER — OXYTOCIN 40 UNITS IN LACTATED RINGERS INFUSION - SIMPLE MED: 62.5000 mL/h | INTRAVENOUS | Status: AC

## 2015-04-19 MED ORDER — DIPHENHYDRAMINE HCL 25 MG PO CAPS: 25.0000 mg | ORAL_CAPSULE | Freq: Four times a day (QID) | ORAL | Status: DC | PRN

## 2015-04-19 MED ORDER — BUPIVACAINE HCL (PF) 0.5 % IJ SOLN
INTRAMUSCULAR | Status: DC | PRN
  Administered 2015-04-19: 30 mL

## 2015-04-19 MED ORDER — SIMETHICONE 80 MG PO CHEW
80.0000 mg | CHEWABLE_TABLET | Freq: Three times a day (TID) | ORAL | Status: DC
  Administered 2015-04-20 – 2015-04-21 (×4): 80 mg via ORAL
  Filled 2015-04-19 (×5): qty 1

## 2015-04-19 MED ORDER — MIDAZOLAM HCL 5 MG/5ML IJ SOLN
INTRAMUSCULAR | Status: DC | PRN
  Administered 2015-04-19: 2 mg via INTRAVENOUS

## 2015-04-19 MED ORDER — ONDANSETRON HCL 4 MG/2ML IJ SOLN: 4.0000 mg | Freq: Four times a day (QID) | INTRAMUSCULAR | Status: DC | PRN

## 2015-04-19 MED ORDER — SCOPOLAMINE 1 MG/3DAYS TD PT72
MEDICATED_PATCH | TRANSDERMAL | Status: DC | PRN
  Administered 2015-04-19: 1 via TRANSDERMAL

## 2015-04-19 MED ORDER — ACETAMINOPHEN 325 MG PO TABS: 650.0000 mg | ORAL_TABLET | ORAL | Status: DC | PRN

## 2015-04-19 MED ORDER — SUCCINYLCHOLINE CHLORIDE 20 MG/ML IJ SOLN
INTRAMUSCULAR | Status: AC
  Filled 2015-04-19: qty 1

## 2015-04-19 MED ORDER — SIMETHICONE 80 MG PO CHEW
80.0000 mg | CHEWABLE_TABLET | ORAL | Status: DC
  Administered 2015-04-19 – 2015-04-20 (×2): 80 mg via ORAL
  Filled 2015-04-19 (×2): qty 1

## 2015-04-19 MED ORDER — ONDANSETRON HCL 4 MG/2ML IJ SOLN: 4.0000 mg | Freq: Three times a day (TID) | INTRAMUSCULAR | Status: DC | PRN

## 2015-04-19 MED ORDER — FENTANYL CITRATE (PF) 100 MCG/2ML IJ SOLN
25.0000 ug | INTRAMUSCULAR | Status: DC | PRN
  Administered 2015-04-19 (×2): 50 ug via INTRAVENOUS

## 2015-04-19 MED ORDER — NALBUPHINE HCL 10 MG/ML IJ SOLN: 5.0000 mg | INTRAMUSCULAR | Status: DC | PRN

## 2015-04-19 MED ORDER — TETANUS-DIPHTH-ACELL PERTUSSIS 5-2.5-18.5 LF-MCG/0.5 IM SUSP: 0.5000 mL | Freq: Once | INTRAMUSCULAR | Status: DC

## 2015-04-19 MED ORDER — ONDANSETRON HCL 4 MG/2ML IJ SOLN
INTRAMUSCULAR | Status: AC
  Filled 2015-04-19: qty 2

## 2015-04-19 MED ORDER — GENTAMICIN SULFATE 40 MG/ML IJ SOLN
Freq: Once | INTRAVENOUS | Status: AC
  Administered 2015-04-19: 100 mL via INTRAVENOUS
  Filled 2015-04-19: qty 10.75

## 2015-04-19 MED ORDER — DIPHENHYDRAMINE HCL 50 MG/ML IJ SOLN: 12.5000 mg | INTRAMUSCULAR | Status: DC | PRN

## 2015-04-19 MED ORDER — AMLODIPINE BESYLATE 10 MG PO TABS
10.0000 mg | ORAL_TABLET | Freq: Every day | ORAL | Status: DC
  Administered 2015-04-19 – 2015-04-20 (×2): 10 mg via ORAL
  Filled 2015-04-19 (×3): qty 1

## 2015-04-19 MED ORDER — DEXTROSE 5 % IV SOLN
1.0000 ug/kg/h | INTRAVENOUS | Status: DC | PRN
  Filled 2015-04-19: qty 2

## 2015-04-19 MED ORDER — DIPHENHYDRAMINE HCL 12.5 MG/5ML PO ELIX
12.5000 mg | ORAL_SOLUTION | Freq: Four times a day (QID) | ORAL | Status: DC | PRN
  Filled 2015-04-19: qty 5

## 2015-04-19 MED ORDER — LACTATED RINGERS IV SOLN
INTRAVENOUS | Status: DC
  Administered 2015-04-19: 22:00:00 via INTRAVENOUS

## 2015-04-19 MED ORDER — OXYTOCIN 10 UNIT/ML IJ SOLN
INTRAMUSCULAR | Status: DC | PRN
  Administered 2015-04-19: 20 [IU]

## 2015-04-19 MED ORDER — MORPHINE SULFATE 0.5 MG/ML IJ SOLN
INTRAMUSCULAR | Status: AC
  Filled 2015-04-19: qty 10

## 2015-04-19 MED ORDER — FENTANYL CITRATE (PF) 100 MCG/2ML IJ SOLN
100.0000 ug | INTRAMUSCULAR | Status: DC | PRN
Start: 2015-04-19 — End: 2015-04-21
  Administered 2015-04-19: 100 ug via INTRAVENOUS
  Filled 2015-04-19: qty 2

## 2015-04-19 MED ORDER — HYDRALAZINE HCL 20 MG/ML IJ SOLN
10.0000 mg | Freq: Once | INTRAMUSCULAR | Status: AC
  Administered 2015-04-19: 10 mg via INTRAVENOUS
  Filled 2015-04-19: qty 1

## 2015-04-19 MED ORDER — DIPHENHYDRAMINE HCL 50 MG/ML IJ SOLN: 12.5000 mg | Freq: Four times a day (QID) | INTRAMUSCULAR | Status: DC | PRN

## 2015-04-19 MED ORDER — SENNOSIDES-DOCUSATE SODIUM 8.6-50 MG PO TABS
2.0000 | ORAL_TABLET | ORAL | Status: DC
  Administered 2015-04-19 – 2015-04-20 (×2): 2 via ORAL
  Filled 2015-04-19 (×2): qty 2

## 2015-04-19 MED ORDER — MENTHOL 3 MG MT LOZG: 1.0000 | LOZENGE | OROMUCOSAL | Status: DC | PRN

## 2015-04-19 MED ORDER — SODIUM CHLORIDE 0.9 % IJ SOLN: 9.0000 mL | INTRAMUSCULAR | Status: DC | PRN

## 2015-04-19 MED ORDER — WITCH HAZEL-GLYCERIN EX PADS: 1.0000 "application " | MEDICATED_PAD | CUTANEOUS | Status: DC | PRN

## 2015-04-19 SURGICAL SUPPLY — 34 items
BARRIER ADHS 3X4 INTERCEED (GAUZE/BANDAGES/DRESSINGS) IMPLANT
BRR ADH 4X3 ABS CNTRL BYND (GAUZE/BANDAGES/DRESSINGS)
CLAMP CORD UMBIL (MISCELLANEOUS) IMPLANT
CLIP FILSHIE TUBAL LIGA STRL (Clip) ×2 IMPLANT
CLOTH BEACON ORANGE TIMEOUT ST (SAFETY) ×3 IMPLANT
DRAPE SHEET LG 3/4 BI-LAMINATE (DRAPES) IMPLANT
DRSG OPSITE POSTOP 4X10 (GAUZE/BANDAGES/DRESSINGS) ×3 IMPLANT
DURAPREP 26ML APPLICATOR (WOUND CARE) ×3 IMPLANT
ELECT REM PT RETURN 9FT ADLT (ELECTROSURGICAL) ×3
ELECTRODE REM PT RTRN 9FT ADLT (ELECTROSURGICAL) ×1 IMPLANT
EXTRACTOR VACUUM KIWI (MISCELLANEOUS) IMPLANT
GLOVE BIO SURGEON STRL SZ 6.5 (GLOVE) ×2 IMPLANT
GLOVE BIO SURGEONS STRL SZ 6.5 (GLOVE) ×1
GLOVE BIOGEL PI IND STRL 7.0 (GLOVE) ×1 IMPLANT
GLOVE BIOGEL PI INDICATOR 7.0 (GLOVE) ×2
GOWN STRL REUS W/TWL LRG LVL3 (GOWN DISPOSABLE) ×6 IMPLANT
KIT ABG SYR 3ML LUER SLIP (SYRINGE) IMPLANT
NDL HYPO 25X5/8 SAFETYGLIDE (NEEDLE) IMPLANT
NEEDLE HYPO 22GX1.5 SAFETY (NEEDLE) IMPLANT
NEEDLE HYPO 25X5/8 SAFETYGLIDE (NEEDLE) IMPLANT
NS IRRIG 1000ML POUR BTL (IV SOLUTION) ×3 IMPLANT
PACK C SECTION WH (CUSTOM PROCEDURE TRAY) ×3 IMPLANT
PAD OB MATERNITY 4.3X12.25 (PERSONAL CARE ITEMS) ×3 IMPLANT
RETRACTOR WND ALEXIS 25 LRG (MISCELLANEOUS) IMPLANT
RTRCTR WOUND ALEXIS 25CM LRG (MISCELLANEOUS)
SUT PLAIN 2 0 (SUTURE) ×3
SUT PLAIN ABS 2-0 CT1 27XMFL (SUTURE) IMPLANT
SUT VIC AB 0 CT1 36 (SUTURE) ×18 IMPLANT
SUT VIC AB 2-0 CT1 27 (SUTURE) ×3
SUT VIC AB 2-0 CT1 TAPERPNT 27 (SUTURE) ×1 IMPLANT
SUT VIC AB 4-0 PS2 27 (SUTURE) ×3 IMPLANT
SYR CONTROL 10ML LL (SYRINGE) IMPLANT
TOWEL OR 17X24 6PK STRL BLUE (TOWEL DISPOSABLE) ×3 IMPLANT
TRAY FOLEY CATH SILVER 14FR (SET/KITS/TRAYS/PACK) IMPLANT

## 2015-04-19 NOTE — Anesthesia Procedure Notes (Addendum)
Spinal Patient location during procedure: OR Staffing Anesthesiologist: MOSER, CHRIS Preanesthetic Checklist Completed: patient identified, surgical consent, pre-op evaluation, timeout performed, IV checked, risks and benefits discussed and monitors and equipment checked Spinal Block Patient position: sitting Prep: site prepped and draped and DuraPrep Patient monitoring: heart rate, cardiac monitor, continuous pulse ox and blood pressure Approach: midline Location: L3-4 Injection technique: single-shot Needle Needle type: Pencan  Needle gauge: 24 G Needle length: 10 cm Assessment Sensory level: T6  Procedure Name: Intubation Date/Time: 04/19/2015 2:15 PM Performed by: Tilda BurrowFERGUSON, JOHN V Pre-anesthesia Checklist: Patient identified, Emergency Drugs available, Suction available and Patient being monitored Patient Re-evaluated:Patient Re-evaluated prior to inductionOxygen Delivery Method: Circle system utilized Preoxygenation: Pre-oxygenation with 100% oxygen Intubation Type: IV induction, Cricoid Pressure applied and Rapid sequence Laryngoscope Size: Miller and 3 Grade View: Grade II Tube type: Oral Tube size: 7.0 mm Number of attempts: 1 Airway Equipment and Method: Stylet Placement Confirmation: ETT inserted through vocal cords under direct vision,  positive ETCO2 and breath sounds checked- equal and bilateral Secured at: 24 cm Tube secured with: Tape Dental Injury: Teeth and Oropharynx as per pre-operative assessment

## 2015-04-19 NOTE — Anesthesia Preprocedure Evaluation (Addendum)
Anesthesia Evaluation  Patient identified by MRN, date of birth, ID band Patient awake    Reviewed: Allergy & Precautions, NPO status , Patient's Chart, lab work & pertinent test results  History of Anesthesia Complications Negative for: history of anesthetic complications  Airway Mallampati: III  TM Distance: >3 FB Neck ROM: Full    Dental  (+) Teeth Intact   Pulmonary neg shortness of breath, neg sleep apnea, neg COPDneg recent URI, former smoker,  breath sounds clear to auscultation        Cardiovascular hypertension, Pt. on medications Rhythm:Regular     Neuro/Psych  Headaches, PSYCHIATRIC DISORDERS Depression Bipolar Disorder    GI/Hepatic Neg liver ROS, GERD-  Medicated and Controlled,  Endo/Other  Morbid obesity  Renal/GU negative Renal ROS     Musculoskeletal   Abdominal   Peds  Hematology  (+) anemia ,   Anesthesia Other Findings   Reproductive/Obstetrics (+) Pregnancy                            Anesthesia Physical Anesthesia Plan  ASA: II  Anesthesia Plan: Spinal   Post-op Pain Management:    Induction:   Airway Management Planned: Natural Airway  Additional Equipment: None  Intra-op Plan:   Post-operative Plan:   Informed Consent: I have reviewed the patients History and Physical, chart, labs and discussed the procedure including the risks, benefits and alternatives for the proposed anesthesia with the patient or authorized representative who has indicated his/her understanding and acceptance.   Dental advisory given  Plan Discussed with: CRNA and Surgeon  Anesthesia Plan Comments:         Anesthesia Quick Evaluation

## 2015-04-19 NOTE — Progress Notes (Addendum)
Resident and DrArnold notified of patient's desire for CS. Informed of conversation with patient regarding wishes for Cesarean delivery, not wanting to continue with VBAC due to patients wishes for BTL. Patient stated she was not informed of anesthesia options during tubal and it did not make sense for her to continue with augmentation. Dr Debroah LoopArnold and Marlynn Perking. Lawson, CNM on way to assess patient desire for CS.

## 2015-04-19 NOTE — Progress Notes (Signed)
Patient ID: Danielle Estes, female   DOB: 01-Jul-1981, 34 y.o.   MRN: [id number] Danielle Estes is a 34 y.o. G4P3003 at 4742w0d.  Subjective: Able to sleep thought most UC's.   Objective: BP 132/88 mmHg  Pulse 80  Temp(Src) 97.9 F (36.6 C) (Oral)  Resp 18  Ht 5\' 9"  (1.753 m)  Wt 257 lb 8 oz (116.801 kg)  BMI 38.01 kg/m2  LMP 07/02/2014   FHT:  FHR: 120 bpm, variability: mod,  accelerations:  15x15,  decelerations:  none UC:   Q 4 minutes, mild  Dilation: 3.5 Effacement (%): 60 Cervical Position: Posterior Station: -3 Presentation: Vertex Exam by:: Ivonne AndrewV. Makenleigh Crownover, CNM  Labs: Results for orders placed or performed during the hospital encounter of 04/18/15 (from the past 24 hour(s))  Urinalysis, Routine w reflex microscopic     Status: Abnormal   Collection Time: 04/18/15  8:30 PM  Result Value Ref Range   Color, Urine YELLOW YELLOW   APPearance HAZY (A) CLEAR   Specific Gravity, Urine 1.020 1.005 - 1.030   pH 7.0 5.0 - 8.0   Glucose, UA NEGATIVE NEGATIVE mg/dL   Hgb urine dipstick NEGATIVE NEGATIVE   Bilirubin Urine NEGATIVE NEGATIVE   Ketones, ur NEGATIVE NEGATIVE mg/dL   Protein, ur NEGATIVE NEGATIVE mg/dL   Urobilinogen, UA 0.2 0.0 - 1.0 mg/dL   Nitrite NEGATIVE NEGATIVE   Leukocytes, UA SMALL (A) NEGATIVE  Protein / creatinine ratio, urine     Status: None   Collection Time: 04/18/15  8:30 PM  Result Value Ref Range   Creatinine, Urine 129.00 mg/dL   Total Protein, Urine 19 mg/dL   Protein Creatinine Ratio 0.15 0.00 - 0.15 mg/mg[Cre]  Urine microscopic-add on     Status: Abnormal   Collection Time: 04/18/15  8:30 PM  Result Value Ref Range   Squamous Epithelial / LPF FEW (A) RARE   WBC, UA 0-2 <3 WBC/hpf   RBC / HPF 0-2 <3 RBC/hpf   Bacteria, UA RARE RARE  CBC     Status: Abnormal   Collection Time: 04/18/15  9:06 PM  Result Value Ref Range   WBC 7.6 4.0 - 10.5 K/uL   RBC 3.84 (L) 3.87 - 5.11 MIL/uL   Hemoglobin 11.0 (L) 12.0 - 15.0 g/dL   HCT 40.934.3 (L) 81.136.0 -  46.0 %   MCV 89.3 78.0 - 100.0 fL   MCH 28.6 26.0 - 34.0 pg   MCHC 32.1 30.0 - 36.0 g/dL   RDW 91.415.0 78.211.5 - 95.615.5 %   Platelets 187 150 - 400 K/uL  Comprehensive metabolic panel     Status: Abnormal   Collection Time: 04/18/15  9:06 PM  Result Value Ref Range   Sodium 137 135 - 145 mmol/L   Potassium 4.1 3.5 - 5.1 mmol/L   Chloride 110 101 - 111 mmol/L   CO2 20 (L) 22 - 32 mmol/L   Glucose, Bld 85 70 - 99 mg/dL   BUN 6 6 - 20 mg/dL   Creatinine, Ser 2.130.42 (L) 0.44 - 1.00 mg/dL   Calcium 8.9 8.9 - 08.610.3 mg/dL   Total Protein 6.1 (L) 6.5 - 8.1 g/dL   Albumin 2.8 (L) 3.5 - 5.0 g/dL   AST 18 15 - 41 U/L   ALT 14 14 - 54 U/L   Alkaline Phosphatase 153 (H) 38 - 126 U/L   Total Bilirubin 0.5 0.3 - 1.2 mg/dL   GFR calc non Af Amer >60 >60 mL/min   GFR calc  Af Amer >60 >60 mL/min   Anion gap 7 5 - 15  Type and screen     Status: None   Collection Time: 04/18/15  9:06 PM  Result Value Ref Range   ABO/RH(D) O POS    Antibody Screen NEG    Sample Expiration 04/21/2015     Assessment / Plan: 218w0d week IUP BP much better.  Labor: Early  Fetal Wellbeing:  Category I Pain Control:  Fentanyl Anticipated MOD:  SVD Continue Pitocin   Dorathy KinsmanVirginia Maureen Duesing, CNM 04/19/2015 3:13 AM

## 2015-04-19 NOTE — Op Note (Signed)
Cesarean Section Procedure Note   Shaquoia D Gayden  04/18/2015 - 04/19/2015  Indications: Scheduled Proceedure/Maternal Request Declines TOLAC Pre-operative Diagnosis: repeat cesarean section with bilateral tubal ligation.   Post-operative Diagnosis: Same   Surgeon: Surgeon(s) and Role:    * Adam PhenixJames G Arnold, MD - Primary   Assistants: none  Anesthesia: general, spinal   Procedure Details:  The patient was seen in the Holding Room. The risks, benefits, complications, treatment options, and expected outcomes were discussed with the patient. The patient concurred with the proposed plan, giving informed consent. identified as Christin D Wordell and the procedure verified as C-Section Delivery. A Time Out was held and the above information confirmed. Adequate spinal anesthesia was not achieved and general anesthesia was induced. After induction of anesthesia, the patient was draped and prepped in the usual sterile manner. A transverse was made and carried down through the subcutaneous tissue to the fascia. Fascial incision was made and extended transversely. The fascia was separated from the underlying rectus tissue superiorly and inferiorly. The peritoneum was identified and entered. Peritoneal incision was extended longitudinally. The utero-vesical peritoneal reflection was incised transversely and the bladder flap was bluntly freed from the lower uterine segment. A low transverse uterine incision was made. Delivered from cephalic presentation was a 3400 gram Female with Apgar scores of 8 at one minute and 9 at five minutes. Cord ph was not sent the umbilical cord was clamped and cut cord blood was obtained for evaluation. The placenta was removed Intact and appeared normal. The uterine outline, tubes and ovaries appeared normal. The uterus was boggy and IV and intramyometrial pitocin was given with good result. The uterine incision was closed with running locked sutures of 0Vicryl. An imbricating layer  followed. Hemostasis was observed. The tubes were identified and Filshie clips with good positioning were placed bilaterally. The peritoneum was closed with 2-0 Vicryl.  The fascia was then reapproximated with running sutures of 0Vicryl. The subcutaneous closure was performed using 2-0plain gut. The skin was closed with 4-0Vicryl.   Instrument, sponge, and needle counts were correct prior the abdominal closure and were correct at the conclusion of the case.    Findings:   Estimated Blood Loss: 1500 ml  Total IV Fluids: 2000ml   Urine Output: 100CC OF clear urine  Complications: no complications  Disposition: PACU - hemodynamically stable.   Maternal Condition: stable   Baby condition / location:  Nursery  Attending Attestation: I was present and scrubbed for the entire procedure.   Signed: Surgeon(s): Adam PhenixJames G Arnold, MD

## 2015-04-19 NOTE — Progress Notes (Signed)
Danielle Estes is a 34 y.o. [phone] at 642w0d by ultrasound [facility] with severe range BPs.  Subjective: Patient doing well.  No change in pressure at this time.  Denies any concerns  Objective: BP 125/81 mmHg  Pulse 80  Temp(Src) 98.6 F (37 C) (Oral)  Resp 18  Ht 5\' 9"  (1.753 m)  Wt 257 lb 8 oz (116.801 kg)  BMI 38.01 kg/m2  LMP 07/02/2014     FHT:  FHR: 125 bpm, variability: moderate,  accelerations:  Present,  decelerations:  Absent UC:   regular, every 2-6 minutes SVE:   Dilation: 3.5 Effacement (%): 60 Station: -3 Exam by:: Delynn FlavinAshly Abu Heavin, resident  Labs: Lab Results  Component Value Date   WBC 7.6 04/18/2015   HGB 11.0* 04/18/2015   HCT 34.3* 04/18/2015   MCV 89.3 04/18/2015   PLT 187 04/18/2015    Assessment / Plan: Induction of labor due to cHTN with superimposed Preeclampsia with severe range BPs,  progressing well on pitocin  Labor: Progressing on Pitocin, will continue to increase then AROM Preeclampsia:  no signs or symptoms of toxicity, on Labetalol  Fetal Wellbeing:  Category I Pain Control:  Fentanyl I/D:  n/a Anticipated MOD:  NSVD  Raliegh IpGottschalk, Hodges Treiber M, DO 04/19/2015, 9:03 AM

## 2015-04-19 NOTE — Transfer of Care (Signed)
Immediate Anesthesia Transfer of Care Note  Patient: Danielle Estes  Procedure(s) Performed: Procedure(s): CESAREAN SECTION (N/A)  Patient Location: PACU  Anesthesia Type:General  Level of Consciousness: awake, alert  and oriented  Airway & Oxygen Therapy: Patient Spontanous Breathing and Patient connected to nasal cannula oxygen  Post-op Assessment: Report given to RN and Post -op Vital signs reviewed and stable  Post vital signs: Reviewed and stable  Last Vitals:  Filed Vitals:   04/19/15 1300  BP: 139/84  Pulse: 90  Temp: 36.8 C  Resp: 20    Complications: No apparent anesthesia complications

## 2015-04-19 NOTE — Anesthesia Postprocedure Evaluation (Signed)
  Anesthesia Post-op Note  Patient: Danielle Estes  Procedure(s) Performed: Procedure(s): CESAREAN SECTION (N/A)  Patient Location: PACU  Anesthesia Type:General and Spinal  Level of Consciousness: awake  Airway and Oxygen Therapy: Patient Spontanous Breathing and Patient connected to nasal cannula oxygen  Post-op Pain: mild  Post-op Assessment: Post-op Vital signs reviewed, Patient's Cardiovascular Status Stable, Respiratory Function Stable, Patent Airway, No signs of Nausea or vomiting and Pain level controlled  Post-op Vital Signs: Reviewed and stable  Last Vitals:  Filed Vitals:   04/19/15 1715  BP: 145/94  Pulse: 90  Temp:   Resp: 14    Complications: No apparent anesthesia complications

## 2015-04-19 NOTE — Progress Notes (Signed)
Danielle Estes is a 34 y.o. [phone] at 7932w0d by ultrasound admitted for active labor  Subjective: declines TOLAC and requests repeat cesarean section and wants sterilization   Objective: BP 156/96 mmHg  Pulse 88  Temp(Src) 97.9 F (36.6 C) (Oral)  Resp 18  Ht 5\' 9"  (1.753 m)  Wt 116.801 kg (257 lb 8 oz)  BMI 38.01 kg/m2  LMP 07/02/2014      FHT:   Fetal Heart Rate A      Mode  -- [assessing, reapplied] filed at 04/19/2015 1027    Baseline Rate (A)  125 bpm filed at 04/19/2015 1000    Variability  6-25 BPM filed at 04/19/2015 1000    Accelerations  15 x 15 filed at 04/19/2015 1000    Decelerations  None filed at 04/19/2015 1000    Scalp Stimulation  Positive filed at 04/19/2015 0309       UC:   irregular, every 3-6 minutes SVE:   Dilation: 3.5 Effacement (%): 60 Station: -3 Exam by:: Erskine SpeedAshley Goshalk, resident  Labs: Lab Results  Component Value Date   WBC 7.6 04/18/2015   HGB 11.0* 04/18/2015   HCT 34.3* 04/18/2015   MCV 89.3 04/18/2015   PLT 187 04/18/2015    Assessment / Plan: Protracted latent phase  Labor: Wants cesarean section Preeclampsia:  intake and ouput balanced and labs stable Fetal Wellbeing:  Category I Pain Control:  Labor support without medications I/D:  n/a Anticipated MOD:  Cesareans section and BTL  The procedure and the risk of anesthesia, bleeding, transfusion,  infection, bowel and bladder injury, sterilization failure (1/200) and ectopic pregnancy were discussed and her questions were answered.     ARNOLD,JAMES 04/19/2015, 12:47 PM

## 2015-04-19 NOTE — Plan of Care (Signed)
Problem: Phase I Progression Outcomes Goal: Assess per MD/Nurse,Routine-VS,FHR,UC,Head to Toe assess Outcome: Progressing Patient educated on benefits of Labetalol for increased blood pressures. Discussed with patient the signs and symptoms of hypertension. Pt desires to skip dose of Labetalol despite encouragement. Will plan to continue taking after CS.

## 2015-04-19 NOTE — Progress Notes (Signed)
CNMW called at 2030 to report elevated B/P orders received for stat IV med and oral med to be started as soon as pt. Can tolerate food. Tried to wean pt off 2liters of O2 which she came from PACU needing but ETCO2 alarms did not enable me to wean. Started 2L back at 2200. Pt ordered food but stated she was not hungry. Encouraged to eat. Due to 1500 EBL did not ambulate at 2200 check. CBC ordered for AM. Pt tolerated fundal check but complained that we had to do them bleeding was small. Pt has not wanted to breast feed infant tonight only bottle feed. FOB is helping. Social work consult in as pt is bipolar and do not know if she is on meds.

## 2015-04-20 ENCOUNTER — Encounter (HOSPITAL_COMMUNITY): Payer: Self-pay | Admitting: Obstetrics & Gynecology

## 2015-04-20 LAB — CBC
HCT: 24.9 % — ABNORMAL LOW (ref 36.0–46.0)
Hemoglobin: 8.2 g/dL — ABNORMAL LOW (ref 12.0–15.0)
MCH: 28.5 pg (ref 26.0–34.0)
MCHC: 32.9 g/dL (ref 30.0–36.0)
MCV: 86.5 fL (ref 78.0–100.0)
PLATELETS: 169 10*3/uL (ref 150–400)
RBC: 2.88 MIL/uL — ABNORMAL LOW (ref 3.87–5.11)
RDW: 15.1 % (ref 11.5–15.5)
WBC: 8.7 10*3/uL (ref 4.0–10.5)

## 2015-04-20 MED ORDER — HYDROMORPHONE HCL 2 MG PO TABS
2.0000 mg | ORAL_TABLET | ORAL | Status: DC | PRN
  Administered 2015-04-20 – 2015-04-21 (×6): 2 mg via ORAL
  Filled 2015-04-20 (×7): qty 1

## 2015-04-20 NOTE — Anesthesia Postprocedure Evaluation (Signed)
  Anesthesia Post-op Note  Patient: Danielle Estes  Procedure(s) Performed: Procedure(s): CESAREAN SECTION (N/A)  Patient Location: Mother/Baby  Anesthesia Type:General and Spinal  Level of Consciousness: awake, alert , oriented and patient cooperative  Airway and Oxygen Therapy: Patient Spontanous Breathing  Post-op Pain: none  Post-op Assessment: Post-op Vital signs reviewed, Patient's Cardiovascular Status Stable, Respiratory Function Stable, Patent Airway, No signs of Nausea or vomiting, No headache, No backache, No residual numbness and No residual motor weakness  Post-op Vital Signs: Reviewed and stable  Last Vitals:  Filed Vitals:   04/20/15 0608  BP:   Pulse:   Temp:   Resp: 23    Complications: No apparent anesthesia complications

## 2015-04-20 NOTE — Progress Notes (Signed)
Subjective: Postpartum Day 1: Cesarean Delivery Patient reports incisional pain and tolerating PO.    Objective: Vital signs in last 24 hours: Temp:  [97.6 F (36.4 C)-99.5 F (37.5 C)] 99.5 F (37.5 C) (05/09 0149) Pulse Rate:  [77-135] 122 (05/09 0600) Resp:  [2-23] 23 (05/09 0608) BP: (118-160)/(70-103) 121/70 mmHg (05/09 0600) SpO2:  [100 %] 100 % (05/09 0608) FiO2 (%):  [100 %] 100 % (05/09 56210608)  Physical Exam:  General: alert, cooperative, appears stated age and no distress Lochia: appropriate Uterine Fundus: firm Incision: healing well, no significant drainage, no dehiscence, no significant erythema DVT Evaluation: No evidence of DVT seen on physical exam. Negative Homan's sign. No cords or calf tenderness.   Recent Labs  04/18/15 2106 04/20/15 0550  HGB 11.0* 8.2*  HCT 34.3* 24.9*    Assessment/Plan: Status post Cesarean section. Doing well postoperatively.  Continue current care.  Wyvonnia DuskyLAWSON, MARIE DARLENE 04/20/2015, 6:53 AM

## 2015-04-20 NOTE — Addendum Note (Signed)
Addendum  created 04/20/15 0754 by Yolonda KidaAlison L Moraima Burd, CRNA   Modules edited: Notes Section   Notes Section:  File: [id number]

## 2015-04-20 NOTE — Lactation Note (Signed)
This note was copied from the chart of Danielle Estes. Lactation Consultation Note  Patient Name: Danielle Estes Today's Date: 04/20/2015 Reason for consult: Follow-up assessment (baby has had several bottles )  Baby is 24 hours old and has had several bottles in his 24 hrs of life. Mom still has a desire to breast feed, just hasn't felt up to it.  @ consult LC checked diaper , noted to dry, showed mom how to hand express,several small  Drops from the left breast. Baby awake for a short time , attempted to latch, but didn't seem interested. LC recommended to mom skin to skin , and placed baby on her chest.  Baby and mom seemed comfortable. LC asked mom to call with feeding cues and to hold off on feeding the baby  Formula so we could see how the baby latches. LC discussed latch scoring very shift and what it entailed.      Maternal Data Has patient been taught Hand Expression?: Yes  Feeding Feeding Type: Breast Fed  LATCH Score/Interventions Latch: Too sleepy or reluctant, no latch achieved, no sucking elicited.  Audible Swallowing: None  Type of Nipple: Everted at rest and after stimulation  Comfort (Breast/Nipple): Soft / non-tender     Hold (Positioning): Assistance needed to correctly position infant at breast and maintain latch. Intervention(s): Breastfeeding basics reviewed;Support Pillows;Position options;Skin to skin  LATCH Score: 5  Lactation Tools Discussed/Used     Consult Status Consult Status: Follow-up Date: 04/20/15 Follow-up type: In-patient    Danielle Estes, [name] 04/20/2015, 3:09 PM

## 2015-04-20 NOTE — Progress Notes (Signed)
CLINICAL SOCIAL WORK MATERNAL/CHILD NOTE  Patient Details  Name: Danielle Estes MRN: [id number] Date of Birth: 04/19/2017  Date:  04/20/2015  Clinical Social Worker Initiating Note:  Loleta BooksSarah Chanequa Spees, LCSW Date/ Time Initiated:  04/20/15/1300     Child's Name:  Danielle Estes   Legal Guardian:  Danielle Estes and Danielle Estes (parents)  Need for Interpreter:  None   Date of Referral:  04/19/15     Reason for Referral:  History of bipolar    [facility]   Address:  [address] [place], [place] [address]  Phone number:  [phone]   Household Members:  Minor Children (ages 457 and [phone]), Spouse   Natural Supports (not living in the home):  Immediate Family, Extended Family   Professional Supports: MOB has been [place] [facility] [place] to assist with medication management for mental health now that she is no longer pregnant  Employment: Full-time   Type of Work: Surveyor, 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   Cognitive State:  Able to Concentrate , Alert , Goal Oriented , Insightful    Mood/Affect:  Happy , Interested , Animated   CSW Assessment:  MOB presented as easily engaged and receptive to the visit.  CSW spoke briefly with FOB, but FOB left during the visit  in order to return home for a few hours.  Infant was in the room briefly as he went to receive his head ultrasound.  Prior to infant leaving for the procedure, MOB was noted to be holding and bonding with the infant.  CSW spent approximately 45 minutes with the MOB to help her process her thoughts and feelings as she reflected upon her pregnancy and now transition to the postpartum period.   MOB shared that it continues to feel surreal that the infant has been born.  She smiled as she looked at the infant and reflected upon how she feels to have another son.  Per MOB, she feels well supported by the FOB. She stated that she and the FOB have all items for the baby as well.  MOB also reported that her two daughters (ages 737 and 349) are excited for this infant's arrival.    MOB stated that she strongly identifies with her diagnosis of bipolar. She stated that she was diagnosed with bipolar and postpartum depression after her first son was born (he is now 36 years old).  MOB shared that she was diagnosed when she was in the [employer].  Per MOB, she was [place] 6 weeks postpartum, and reflected upon the emotional difficulties she encountered due to the separation from her son, her bipolar symptoms, and then traumas in the war.  She stated that after she ended her career in the [employer] 9 years ago, but had a difficult time  affording her medications for bipolar.  MOB shared that her symptoms continued to be present and impact her daily life, and discussed worsening of symptoms after her aunt died (who she called "mom") 5 years ago that resulted in her attempting suicide. She stated that she has not attempted suicide since then since she realized at that time that she had "more to live for".  She reflected upon how she realized that she needed to be alive and keep putting forth effort to cope with mental health symptoms for her children.    She shared that she has noted that her bipolar symptoms continue to  impact her life. She stated that she has days when she has low motivation to get out of bed and wants to be isolated. She reported also experiencing SI, but stated that she has learned how to get out of bed, go to work, and actively parent. She shared that she is motivated by her children and reflected upon how they have helped her to find inner-strength when she found it to hard to keep moving on. She also stated that the FOB is supportive of her mental health needs and helps her to ensure that she does not ruminate depressive symptoms.  MOB denied depressive episode during the pregnancy; however, she did report feelings of being in a manic episode during the pregnancy since she felt a decreased need to sleep, experienced frequent racing thoughts, and had periods when she would spend a lot of money impulsively.   As MOB reflected upon her symptoms, it became evident that MOB is self-aware of her mental health symptoms and how it can impact her daily life.   MOB discussed that she has been referred to a psychiatrist in the [place], and shared that she was unable to take her medications for bipolar during the pregnancy since they were "unsafe". She shared that she has no excuses now to not be on her medication, and discussed goal of contacting the psychiatrist in order to schedule an appointment. Per MOB, she intends to contact them within the next 2 days.  MOB denied any additional barriers to accessing care since she can utilize their transportation services to help her get to the appointment.  MOB reflected upon potential gains (feeling better, happier, more stable) if she were able to restart her medications. Comments highlighted motivation to initiate care in the postpartum period.   CSW continued to assist the MOB to identify and process her feelings associated with potential medical needs of the infant.  MOB originally minimized how she felt during the pregnancy, but then later discussed the intense stress she  felt since she would be told different diagnosis and reports of the fetuses health during the pregnancy. She shared that she had been excited that the infant was a Danielle since she wanted another chance at having a son (she reported that a family member has raised her son since she was deployed shortly after his birth and then was gone for numerous years in the [employer]). CSW provided supportive listening and validated her feelings as she discussed the conflicting emotions she felt when she was concerned about the infant's health.  She stated that she continues to have faith that "everything will be okay".    CSW ended the visit by assisting the patient to identify and reflect upon her personal strengths that have assisted her to cope with numerous stressors (work, parenting, mental health concerns).  MOB smiled as she reflected upon her ability to self-regulate by  spending time with her children, reading, and watching television shows.  MOB stated that she continues to be motivated by her children, and it was evident that she highly values her role as a mother.   MOB denied additional needs or support as she transitions to the postpartum period. She agreed to contact CSW if needs arise.  CSW Plan/Description:   1)Patient/Family Education: Increased risk for developing postpartum depression given mental health history and mental health during the pregnancy.  2)Information/Referral to [facility] Resources: MOB reported that she has been referred to psychiatry for medication management. She stated that she can [place] [facility], and denied any other barriers to accessing care. 2)No Further Intervention Required/No Barriers to Discharge    Kelby FamVenning, Jessye Imhoff N, LCSW 04/20/2015, 2:04 PM

## 2015-04-20 NOTE — Lactation Note (Signed)
This note was copied from the chart of Danielle Estes. Lactation Consultation Note Experienced BF but it has been a long time. Her youngest is 697 yrs old. BF 2 children approximately 6 months. Mom had a C-section and is on a PCA, hasn't felt well today or felt like BF. States tomorrow she will try to BF. Had just gave the baby a bottle. Mom stated she would like for LC to come back during the day to show her options in BF.  Discussed position options and obtaining a deep latch.  Mom has everted nipples.  Mom encouraged to feed baby w/feeding cues. Reviewed Baby & Me book's Breastfeeding Basics. Encouraged to call for assistance if needed and to verify proper latch. Mom encouraged to feed baby 8-12 times/24 hours and with feeding cues. Educated about newborn behavior. WH/LC brochure given w/resources, support groups and LC services. Discussed supply and demand, formula feeding and BF, milk supply. Patient Name: Danielle Skylie Pinnock Today's Date: 04/20/2015 Reason for consult: Initial assessment   Maternal Data Has patient been taught Hand Expression?: Yes Does the patient have breastfeeding experience prior to this delivery?: Yes  Feeding Feeding Type: Bottle Fed - Formula Nipple Type: Slow - flow  LATCH Score/Interventions       Type of Nipple: Everted at rest and after stimulation              Lactation Tools Discussed/Used     Consult Status Consult Status: Follow-up Date: 04/20/15 (in pm) Follow-up type: In-patient    Coleton Woon, Diamond NickelLAURA G 04/20/2015, 4:03 AM

## 2015-04-21 MED ORDER — HYDROMORPHONE HCL 2 MG PO TABS: 2.0000 mg | ORAL_TABLET | Freq: Four times a day (QID) | ORAL | Status: DC | PRN

## 2015-04-21 MED ORDER — IBUPROFEN 600 MG PO TABS: 600.0000 mg | ORAL_TABLET | Freq: Four times a day (QID) | ORAL | Status: DC

## 2015-04-21 MED ORDER — AMLODIPINE BESYLATE 10 MG PO TABS: 10.0000 mg | ORAL_TABLET | Freq: Every day | ORAL | Status: DC

## 2015-04-21 NOTE — Discharge Instructions (Signed)
Continue taking your prenatal vitamins while you are breast feeding.  Postpartum Care After Cesarean Delivery After you deliver your newborn (postpartum period), the usual stay in the hospital is 24-72 hours. If there were problems with your labor or delivery, or if you have other medical problems, you might be in the hospital longer.  While you are in the hospital, you will receive help and instructions on how to care for yourself and your newborn during the postpartum period.  While you are in the hospital:  It is normal for you to have pain or discomfort from the incision in your abdomen. Be sure to tell your nurses when you are having pain, where the pain is located, and what makes the pain worse.  If you are breastfeeding, you may feel uncomfortable contractions of your uterus for a couple of weeks. This is normal. The contractions help your uterus get back to normal size.  It is normal to have some bleeding after delivery.  For the first 1-3 days after delivery, the flow is red and the amount may be similar to a period.  It is common for the flow to start and stop.  In the first few days, you may pass some small clots. Let your nurses know if you begin to pass large clots or your flow increases.  Do not  flush blood clots down the toilet before having the nurse look at them.  During the next 3-10 days after delivery, your flow should become more watery and pink or brown-tinged in color.  Ten to fourteen days after delivery, your flow should be a small amount of yellowish-white discharge.  The amount of your flow will decrease over the first few weeks after delivery. Your flow may stop in 6-8 weeks. Most women have had their flow stop by 12 weeks after delivery.  You should change your sanitary pads frequently.  Wash your hands thoroughly with soap and water for at least 20 seconds after changing pads, using the toilet, or before holding or feeding your newborn.  Your intravenous  (IV) tubing will be removed when you are drinking enough fluids.  The urine drainage tube (urinary catheter) that was inserted before delivery may be removed within 6-8 hours after delivery or when feeling returns to your legs. You should feel like you need to empty your bladder within the first 6-8 hours after the catheter has been removed.  In case you become weak, lightheaded, or faint, call your nurse before you get out of bed for the first time and before you take a shower for the first time.  Within the first few days after delivery, your breasts may begin to feel tender and full. This is called engorgement. Breast tenderness usually goes away within 48-72 hours after engorgement occurs. You may also notice milk leaking from your breasts. If you are not breastfeeding, do not stimulate your breasts. Breast stimulation can make your breasts produce more milk.  Spending as much time as possible with your newborn is very important. During this time, you and your newborn can feel close and get to know each other. Having your newborn stay in your room (rooming in) will help to strengthen the bond with your newborn. It will give you time to get to know your newborn and become comfortable caring for your newborn.  Your hormones change after delivery. Sometimes the hormone changes can temporarily cause you to feel sad or tearful. These feelings should not last more than a few days. If  these feelings last longer than that, you should talk to your caregiver.  If desired, talk to your caregiver about methods of family planning or contraception.  Talk to your caregiver about immunizations. Your caregiver may want you to have the following immunizations before leaving the hospital:  Tetanus, diphtheria, and pertussis (Tdap) or tetanus and diphtheria (Td) immunization. It is very important that you and your family (including grandparents) or others caring for your newborn are up-to-date with the Tdap or Td  immunizations. The Tdap or Td immunization can help protect your newborn from getting ill.  Rubella immunization.  Varicella (chickenpox) immunization.  Influenza immunization. You should receive this annual immunization if you did not receive the immunization during your pregnancy. Document Released: 08/22/2012 Document Reviewed: 08/22/2012 [facility] Patient Information 2015 [place]. This information is not intended to replace advice given to you by your health care provider. Make sure you discuss any questions you have with your health care provider.

## 2015-04-21 NOTE — Discharge Summary (Signed)
Obstetric Discharge Summary Reason for Admission: induction of labor/rLTCS Prenatal Procedures: NST and ultrasound Intrapartum Procedures: cesarean: low cervical, transverse and tubal ligation Postpartum Procedures: none Complications-Operative and Postpartum: none  Delivery summary:  For details please see operative note in chart. A low transverse uterine incision was made. Delivered from cephalic presentation was a 3400 gram Female with Apgar scores of 8 at one minute and 9 at five minutes. Cord ph was not sent the umbilical cord was clamped and cut cord blood was obtained for evaluation. The placenta was removed Intact and appeared normal.  Hospital Course:  Active Problems:   Pre-eclampsia superimposed on chronic hypertension, antepartum   Danielle Estes is a 34 y.o. [id number] s/p rLTCS.  Patient was admitted for induction and ultimately underwent a rLTCS.  She had a postpartum course that was uncomplicated including no problems with ambulating, PO intake, urination, pain, or bleeding. The patient feels ready to go home and will be discharged with outpatient follow-up.   Today: No acute events overnight.  Pt denies problems with ambulating, voiding or po intake.  She denies nausea or vomiting.  Pain is well controlled.  She has had flatus. She has had bowel movement.  Lochia Moderate.  Plan for birth control s/p  bilateral tubal ligation.  Method of Feeding: Breast/Bottle  Physical Exam:  Blood pressure 116/72, pulse 105, temperature 98.6 F (37 C), temperature source Oral, resp. rate 18, height 5\' 9"  (1.753 m), weight 257 lb 8 oz (116.801 kg), last menstrual period 07/02/2014, SpO2 98 %, unknown if currently breastfeeding.  General: alert, cooperative, appears stated age and no distress  Cardio: RRR, no murmurs Pulm: CTAB, no increased WOB GI: soft, +BS Lochia: appropriate Uterine Fundus: firm Incision: healing well, no dehiscence, no significant erythema, small amount of dried  blood a lateral edges. Ext: WWP, trace b/l pedal edema DVT Evaluation: No evidence of DVT seen on physical exam. Negative Homan's sign. No cords or calf tenderness.  H/H: Lab Results  Component Value Date/Time   HGB 8.2* 04/20/2015 05:50 AM   HGB 13.2 10/13/2014   HCT 24.9* 04/20/2015 05:50 AM   HCT 39 10/13/2014    Discharge Diagnoses: Term Pregnancy-delivered  Discharge Information: Date: 04/21/2015 Activity: pelvic rest Diet: routine  Medications: PNV, Ibuprofen and Dilaudid Breast feeding:  Yes Condition: stable Instructions: refer to handout, Baby love nurse to see in 3 days to reassess BP Discharge to: home  Assessed by CSW.  Please see excerpt below.  "CSW Assessment: MOB presented as easily engaged and receptive to the visit. CSW spoke briefly with FOB, but FOB left during the visit in order to return home for a few hours. Infant was in the room briefly as he went to receive his head ultrasound. Prior to infant leaving for the procedure, MOB was noted to be holding and bonding with the infant. CSW spent approximately 45 minutes with the MOB to help her process her thoughts and feelings as she reflected upon her pregnancy and now transition to the postpartum period.   MOB shared that it continues to feel surreal that the infant has been born. She smiled as she looked at the infant and reflected upon how she feels to have another son. Per MOB, she feels well supported by the FOB. She stated that she and the FOB have all items for the baby as well. MOB also reported that her two daughters (ages 777 and 899) are excited for this infant's arrival.   MOB stated that she strongly  identifies with her diagnosis of bipolar. She stated that she was diagnosed with bipolar and postpartum depression after her first son was born (he is now 36 years old). MOB shared that she was diagnosed when she was in the [employer]. Per MOB, [place] 6 [place], and reflected  upon the emotional difficulties she encountered due to the separation from her son, her bipolar symptoms, and then traumas in the war. She stated that after she ended her career in the [employer] 9 years ago, but had a difficult time affording her medications for bipolar. MOB shared that her symptoms continued to be present and impact her daily life, and discussed worsening of symptoms after her aunt died (who she called "mom") 5 years ago that resulted in her attempting suicide. She stated that she has not attempted suicide since then since she realized at that time that she had "more to live for". She reflected upon how she realized that she needed to be alive and keep putting forth effort to cope with mental health symptoms for her children.   She shared that she has noted that her bipolar symptoms continue to impact her life. She stated that she has days when she has low motivation to get out of bed and wants to be isolated. She reported also experiencing SI, but stated that she has learned how to get out of bed, go to work, and actively parent. She shared that she is motivated by her children and reflected upon how they have helped her to find inner-strength when she found it to hard to keep moving on. She also stated that the FOB is supportive of her mental health needs and helps her to ensure that she does not ruminate depressive symptoms. MOB denied depressive episode during the pregnancy; however, she did report feelings of being in a manic episode during the pregnancy since she felt a decreased need to sleep, experienced frequent racing thoughts, and had periods when she would spend a lot of money impulsively. As MOB reflected upon her symptoms, it became evident that MOB is self-aware of her mental health symptoms and how it can impact her daily life.   MOB discussed [place] and shared that she was unable to take her medications for bipolar during  the pregnancy since they were "unsafe". She shared that she has no excuses now to not be on her medication, and discussed goal of contacting the psychiatrist in order to schedule an appointment. Per MOB, she intends to contact them within the next 2 days. MOB denied any additional barriers to accessing care since she can utilize their transportation services to help her get to the appointment. MOB reflected upon potential gains (feeling better, happier, more stable) if she were able to restart her medications. Comments highlighted motivation to initiate care in the postpartum period.   CSW continued to assist the MOB to identify and process her feelings associated with potential medical needs of the infant. MOB originally minimized how she felt during the pregnancy, but then later discussed the intense stress she felt since she would be told different diagnosis and reports of the fetuses health during the pregnancy. She shared that she had been excited that the infant was a boy since she wanted another chance at having a son (she reported that a family member has raised her son since she was deployed shortly after his birth and then was gone for numerous years in  the [employer]). CSW provided supportive listening and validated her feelings as she discussed the conflicting emotions she felt when she was concerned about the infant's health. She stated that she continues to have faith that "everything will be okay".   CSW ended the visit by assisting the patient to identify and reflect upon her personal strengths that have assisted her to cope with numerous stressors (work, parenting, mental health concerns). MOB smiled as she reflected upon her ability to self-regulate by spending time with her children, reading, and watching television shows. MOB stated that she continues to be motivated by her children, and it was evident that she highly values her role as a mother.   MOB denied additional needs or  support as she transitions to the postpartum period. She agreed to contact CSW if needs arise."      Discharge Instructions    Baby Love Nurse Visit    Complete by:  As directed   Please check BPs at 3 & 7 days after discharge            Medication List    STOP taking these medications        labetalol 200 MG tablet  Commonly known as:  NORMODYNE      TAKE these medications        amLODipine 10 MG tablet  Commonly known as:  NORVASC  Take 1 tablet (10 mg total) by mouth at bedtime.     HYDROmorphone 2 MG tablet  Commonly known as:  DILAUDID  Take 1 tablet (2 mg total) by mouth every 6 (six) hours as needed for severe pain.     ibuprofen 600 MG tablet  Commonly known as:  ADVIL,MOTRIN  Take 1 tablet (600 mg total) by mouth every 6 (six) hours.     triamcinolone ointment 0.5 %  Commonly known as:  KENALOG  Apply 1 application topically 2 (two) times daily.       Follow-up Information    Follow up with [facility]. Go on 05/22/2015.   Specialty:  Obstetrics and Gynecology   Why:  8:30am post partum follow up   Contact information:   [address] [place] [address] [phone]      Raliegh Ip, DO [facility], PGY-1 04/21/2015,9:33 AM  Seen and examined by me also Agree with note Ready for discharge Aviva Signs, CNM

## 2015-04-21 NOTE — Lactation Note (Signed)
This note was copied from the chart of Danielle Zofia Aguillard. Lactation Consultation Note  Encouraged mother to breastfeed first and for longer periods. Explained supply and demand. Reviewed engorgement care.  Patient Name: Danielle Estes Today's Date: 04/21/2015 Reason for consult: Follow-up assessment   Maternal Data    Feeding    LATCH Score/Interventions                      Lactation Tools Discussed/Used     Consult Status Consult Status: Complete    Hardie PulleyBerkelhammer, Alfie Rideaux Boschen 04/21/2015, 10:58 AM

## 2015-04-22 ENCOUNTER — Telehealth: Payer: Self-pay

## 2015-04-22 NOTE — Telephone Encounter (Signed)
[facility]-- reports in left hand it is 7.7 and in right hand it is 8.3. Patient not symptomatic at this time; reports occasional dizziness when standing quickly. Consulted with Dr. Jolayne Pantheronstant who reccomends patient begin taking iron 3X daily. Informed patient of this and stated she may obtain medication OTC. Discussed possibility of constipation and ways to prevent (increase water intake and diet high in fiber). Discussed symptoms of low iron and when to come to [facility] (SOB, dizziness, extreme weakness). Patient verbalized understanding to all. Denies any questions or concerns.

## 2015-04-28 ENCOUNTER — Encounter (HOSPITAL_COMMUNITY): Payer: Self-pay | Admitting: *Deleted

## 2015-04-28 ENCOUNTER — Emergency Department (HOSPITAL_COMMUNITY): Payer: Non-veteran care

## 2015-04-28 ENCOUNTER — Emergency Department (HOSPITAL_COMMUNITY)
Admission: EM | Admit: 2015-04-28 | Discharge: 2015-04-28 | Disposition: A | Discharge status: Home / Self Care | Payer: Non-veteran care | Attending: Emergency Medicine | Admitting: Emergency Medicine

## 2015-04-28 DIAGNOSIS — Z91040 Latex allergy status: Secondary | ICD-10-CM | POA: Diagnosis not present

## 2015-04-28 DIAGNOSIS — Z87891 Personal history of nicotine dependence: Secondary | ICD-10-CM | POA: Insufficient documentation

## 2015-04-28 DIAGNOSIS — R0602 Shortness of breath: Secondary | ICD-10-CM

## 2015-04-28 DIAGNOSIS — Z8659 Personal history of other mental and behavioral disorders: Secondary | ICD-10-CM | POA: Diagnosis not present

## 2015-04-28 DIAGNOSIS — Z862 Personal history of diseases of the blood and blood-forming organs and certain disorders involving the immune mechanism: Secondary | ICD-10-CM | POA: Insufficient documentation

## 2015-04-28 DIAGNOSIS — I2699 Other pulmonary embolism without acute cor pulmonale: Secondary | ICD-10-CM | POA: Diagnosis not present

## 2015-04-28 DIAGNOSIS — Z880 Allergy status to penicillin: Secondary | ICD-10-CM | POA: Diagnosis not present

## 2015-04-28 DIAGNOSIS — Z8719 Personal history of other diseases of the digestive system: Secondary | ICD-10-CM | POA: Diagnosis not present

## 2015-04-28 DIAGNOSIS — I10 Essential (primary) hypertension: Secondary | ICD-10-CM | POA: Diagnosis not present

## 2015-04-28 DIAGNOSIS — Z79899 Other long term (current) drug therapy: Secondary | ICD-10-CM | POA: Insufficient documentation

## 2015-04-28 LAB — BASIC METABOLIC PANEL
ANION GAP: 11 (ref 5–15)
BUN: 6 mg/dL (ref 6–20)
CHLORIDE: 108 mmol/L (ref 101–111)
CO2: 24 mmol/L (ref 22–32)
CREATININE: 0.66 mg/dL (ref 0.44–1.00)
Calcium: 9.4 mg/dL (ref 8.9–10.3)
GFR calc Af Amer: >60 mL/min (ref >60)
GFR calc non Af Amer: >60 mL/min (ref >60)
GLUCOSE: 90 mg/dL (ref 65–99)
Potassium: 3.7 mmol/L (ref 3.5–5.1)
Sodium: 143 mmol/L (ref 135–145)

## 2015-04-28 LAB — CBC
HCT: 30.3 % — ABNORMAL LOW (ref 36.0–46.0)
HEMOGLOBIN: 9.3 g/dL — AB (ref 12.0–15.0)
MCH: 27.6 pg (ref 26.0–34.0)
MCHC: 30.7 g/dL (ref 30.0–36.0)
MCV: 89.9 fL (ref 78.0–100.0)
Platelets: 351 10*3/uL (ref 150–400)
RBC: 3.37 MIL/uL — ABNORMAL LOW (ref 3.87–5.11)
RDW: 15.7 % — ABNORMAL HIGH (ref 11.5–15.5)
WBC: 10.7 10*3/uL — AB (ref 4.0–10.5)

## 2015-04-28 LAB — I-STAT TROPONIN, ED: Troponin i, poc: 0.02 ng/mL (ref 0.00–0.08)

## 2015-04-28 MED ORDER — SODIUM CHLORIDE 0.9 % IV BOLUS (SEPSIS)
500.0000 mL | Freq: Once | INTRAVENOUS | Status: AC
  Administered 2015-04-28: 500 mL via INTRAVENOUS

## 2015-04-28 MED ORDER — RIVAROXABAN 20 MG PO TABS: 20.0000 mg | ORAL_TABLET | Freq: Every day | ORAL | Status: DC

## 2015-04-28 MED ORDER — ENOXAPARIN SODIUM 150 MG/ML ~~LOC~~ SOLN
150.0000 mg | SUBCUTANEOUS | Status: DC
  Administered 2015-04-28: 150 mg via SUBCUTANEOUS
  Filled 2015-04-28: qty 1

## 2015-04-28 MED ORDER — RIVAROXABAN 15 MG PO TABS
15.0000 mg | ORAL_TABLET | Freq: Two times a day (BID) | ORAL | Status: DC
  Filled 2015-04-28 (×2): qty 1

## 2015-04-28 MED ORDER — RIVAROXABAN (XARELTO) EDUCATION KIT FOR DVT/PE PATIENTS
PACK | Freq: Once | Status: DC
  Filled 2015-04-28: qty 1

## 2015-04-28 MED ORDER — DIPHENHYDRAMINE HCL 50 MG/ML IJ SOLN
50.0000 mg | Freq: Once | INTRAMUSCULAR | Status: AC
  Administered 2015-04-28: 50 mg via INTRAVENOUS
  Filled 2015-04-28: qty 1

## 2015-04-28 MED ORDER — HYDROCORTISONE NA SUCCINATE PF 100 MG IJ SOLR
200.0000 mg | Freq: Once | INTRAMUSCULAR | Status: AC
  Administered 2015-04-28: 200 mg via INTRAVENOUS
  Filled 2015-04-28: qty 4

## 2015-04-28 MED ORDER — ENOXAPARIN SODIUM 100 MG/ML ~~LOC~~ SOLN: 1.5000 mg/kg | SUBCUTANEOUS | Status: DC

## 2015-04-28 MED ORDER — FENTANYL CITRATE (PF) 100 MCG/2ML IJ SOLN
50.0000 ug | Freq: Once | INTRAMUSCULAR | Status: AC
  Administered 2015-04-28: 50 ug via INTRAVENOUS
  Filled 2015-04-28: qty 2

## 2015-04-28 MED ORDER — IOHEXOL 350 MG/ML SOLN
80.0000 mL | Freq: Once | INTRAVENOUS | Status: AC | PRN
  Administered 2015-04-28: 80 mL via INTRAVENOUS

## 2015-04-28 NOTE — Progress Notes (Signed)
Insurance card provided only covers MD; ED CM consulted by EDP for medication assistance  CM reviewed EPIC notes and chart review information CM spoke with the pt about [facility] ($3 co pay for each Rx through [facility], does not include refills, 7 day expiration of MATCH letter and choice of pharmacies) Pt agreed to receive assistance from program. [facility] is [facility] (unable to find pt listed in PDMI per cardholder name inquiry) PDMI information entered. MATCH letter completed and provided to pt.  CM updated EDP and ED RN

## 2015-04-28 NOTE — Progress Notes (Signed)
Benefits check for Lovenox requires prior authorization by MD.  Dr Romeo AppleHarrison given phone number and member id.

## 2015-04-28 NOTE — ED Provider Notes (Signed)
CSN: [id number]     Arrival date & time 04/28/15  1107 History   First MD Initiated Contact with Patient 04/28/15 1114     Chief Complaint  Patient presents with  . Shortness of Breath     (Consider location/radiation/quality/duration/timing/severity/associated sxs/prior Treatment) Patient is a 34 y.o. female presenting with shortness of breath. The history is provided by the patient.  Shortness of Breath Severity:  Mild Onset quality:  Sudden Duration:  12 hours Timing:  Constant Progression:  Unchanged Chronicity:  New Context comment:   after taking a pill of Dilaudid. Relieved by:  Nothing Worsened by:  Nothing tried Ineffective treatments:  None tried Associated symptoms: chest pain   Associated symptoms: no abdominal pain, no cough, no fever, no headaches, no neck pain and no vomiting   Chest pain:    Quality:  Sharp   Severity:  Mild   Onset quality:  Sudden   Duration:  12 hours   Timing:  Constant   Progression:  Unchanged   Chronicity:  New   Past Medical History  Diagnosis Date  . Headache   . Hypertension   . Multiple allergies   . Vaginal Pap smear, abnormal   . Anemia   . GERD (gastroesophageal reflux disease)   . Depression   . Bipolar 1 disorder    Past Surgical History  Procedure Laterality Date  . Cesarean section      C/S x 1  . Breast surgery      had lumped removed at age 34. non cancer  . Cesarean section N/A 04/19/2015    Procedure: CESAREAN SECTION;  Surgeon: Adam PhenixJames G Arnold, MD;  Location: [facility];  Service: Obstetrics;  Laterality: N/A;   Family History  Problem Relation Age of Onset  . Hypertension Maternal Aunt   . Cancer Maternal Aunt   . Heart disease Maternal Grandmother    History  Substance Use Topics  . Smoking status: Former Games developermoker  . Smokeless tobacco: Never Used  . Alcohol Use: No   OB History    Gravida Para Term Preterm AB TAB SAB Ectopic Multiple Living   4 4 4  0 0 0 0 0 0 4     Review of Systems   Constitutional: Negative for fever and fatigue.  HENT: Negative for congestion and drooling.   Eyes: Negative for pain.  Respiratory: Positive for shortness of breath. Negative for cough.   Cardiovascular: Positive for chest pain.  Gastrointestinal: Negative for nausea, vomiting, abdominal pain and diarrhea.  Genitourinary: Negative for dysuria and hematuria.  Musculoskeletal: Negative for back pain, gait problem and neck pain.  Skin: Negative for color change.  Neurological: Negative for dizziness and headaches.  Hematological: Negative for adenopathy.  Psychiatric/Behavioral: Negative for behavioral problems.  All other systems reviewed and are negative.     Allergies  Garlic; Ibuprofen; Latex; Peanut-containing drug products; Shellfish allergy; Vinegar; Aspirin; Banana; Chocolate; Codeine; Eggs or egg-derived products; Iodine; Penicillins; Strawberry; Tylenol; Citrus; and Pineapple  Home Medications   Prior to Admission medications   Medication Sig Start Date End Date Taking? Authorizing Provider  ferrous sulfate 325 (65 FE) MG tablet Take 325 mg by mouth daily with breakfast.   Yes Historical Provider, MD  HYDROmorphone (DILAUDID) 2 MG tablet Take 1 tablet (2 mg total) by mouth every 6 (six) hours as needed for severe pain. 04/21/15  Yes Ashly Hulen SkainsM Gottschalk, DO  triamcinolone ointment (KENALOG) 0.5 % Apply 1 application topically 2 (two) times daily.   Yes Historical  Provider, MD  amLODipine (NORVASC) 10 MG tablet Take 1 tablet (10 mg total) by mouth at bedtime. Patient not taking: Reported on 04/28/2015 04/21/15   Raliegh IpAshly M Gottschalk, DO  ibuprofen (ADVIL,MOTRIN) 600 MG tablet Take 1 tablet (600 mg total) by mouth every 6 (six) hours. Patient not taking: Reported on 04/28/2015 04/21/15   Ashly M Gottschalk, DO   BP 133/83 mmHg  Pulse 86  Temp(Src) 97.8 F (36.6 C) (Oral)  Resp 19  Wt 225 lb (102.059 kg)  SpO2 100% Physical Exam  Constitutional: She is oriented to person,  place, and time. She appears well-developed and well-nourished.  HENT:  Head: Normocephalic and atraumatic.  Mouth/Throat: Oropharynx is clear and moist. No oropharyngeal exudate.  Eyes: Conjunctivae and EOM are normal. Pupils are equal, round, and reactive to light.  Neck: Normal range of motion. Neck supple.  Cardiovascular: Normal rate, regular rhythm, normal heart sounds and intact distal pulses.  Exam reveals no gallop and no friction rub.   No murmur heard. Pulmonary/Chest: Effort normal and breath sounds normal. No respiratory distress. She has no wheezes.  Abdominal: Soft. Bowel sounds are normal. There is no tenderness. There is no rebound and no guarding.   Lower abdominal surgical scar appears intact without evidence of erythema or infection. No significant tenderness of the abdomen.  Musculoskeletal: Normal range of motion. She exhibits no edema or tenderness.  Neurological: She is alert and oriented to person, place, and time.  Skin: Skin is warm and dry.  Psychiatric: She has a normal mood and affect. Her behavior is normal.  Nursing note and vitals reviewed.   ED Course  Procedures (including critical care time) Labs Review Labs Reviewed  CBC - Abnormal; Notable for the following:    WBC 10.7 (*)    RBC 3.37 (*)    Hemoglobin 9.3 (*)    HCT 30.3 (*)    RDW 15.7 (*)    All other components within normal limits  BASIC METABOLIC PANEL  I-STAT TROPOININ, ED    Imaging Review Dg Chest Port 1 View  04/28/2015   CLINICAL DATA:  Shortness of breath, C-section and 04/19/2015  EXAM: PORTABLE CHEST - 1 VIEW  COMPARISON:  07/28/2011  FINDINGS: Cardiomediastinal silhouette is unremarkable. No acute infiltrate or pleural effusion. No pulmonary edema. Bony thorax is unremarkable.  IMPRESSION: No active disease.   Electronically Signed   By: Natasha MeadLiviu  Pop M.D.   On: 04/28/2015 11:31     EKG Interpretation   Date/Time:  Tuesday Apr 28 2015 11:15:15 EDT Ventricular Rate:  106 PR  Interval:  180 QRS Duration: 75 QT Interval:  338 QTC Calculation: 449 R Axis:   51 Text Interpretation:  Sinus tachycardia LAE, consider biatrial enlargement  RSR' in V1 or V2, probably normal variant Baseline wander in lead(s) I II  III aVR aVL aVF V2 No significant change since last tracing Confirmed by  Keelon Zurn  MD, Barnell Shieh ([id number]) on 04/28/2015 11:23:38 AM     CRITICAL CARE Performed by: Purvis SheffieldHARRISON, Ashanta Amoroso, S Total critical care time: 30 min Critical care time was exclusive of separately billable procedures and treating other patients. Critical care was necessary to treat or prevent imminent or life-threatening deterioration. Critical care was time spent personally by me on the following activities: development of treatment plan with patient and/or surrogate as well as nursing, discussions with consultants, evaluation of patient's response to treatment, examination of patient, obtaining history from patient or surrogate, ordering and performing treatments and interventions, ordering and review  of laboratory studies, ordering and review of radiographic studies, pulse oximetry and re-evaluation of patient's condition.  MDM   Final diagnoses:  SOB (shortness of breath)  Pulmonary embolism    11:47 AM 34 y.o. female w hx of HTN s/p C-section on 04/19/15  Who presents with chest pain and shortness of breath. She states her symptoms began at 11 PM last night after she took a oral Dilaudid for abdominal pain relating to her recent C-section. She states that she had otherwise done well since her C-section and had not taken the Dilaudid previously. She notes she was having some abdominal pain when trying to lift her leg up to get into the bathtub. Her abdomen is soft and benign here today and her surgical scar appears well without evidence of infection. Her main complaint is right-sided Sharp chest pain and pleuritic inspiratory component. Will get CT to r/o PE.    Patient notes some occasional  spotting. It sounds like lochia is decreasing appropriately.  4:28 PM: We discussed admission versus d/c home. Pt amenable to d/c home and would like to be w/ her baby. She was advised to pump and dump as she got fentanyl here and she should do this if taking dilaudid at home (states she already has a Rx for this). VS remain stable. O2 sat is 100% on RA. She appear comfortable and denies pain now. Care mgmt saw pt and I had pharmacy see the pt as well. Pt will go home on lovenox shots. First dose here. Pt able [facility].  I have discussed the diagnosis/risks/treatment options with the patient and family and believe the pt to be eligible for discharge home to follow-up with her pcp. We also discussed returning to the ED immediately if new or worsening sx occur. We discussed the sx which are most concerning (e.g., worsening sob, worsening pain, fever, inability to get lovenox) that necessitate immediate return. Medications administered to the patient during their visit and any new prescriptions provided to the patient are listed below.  Medications given during this visit Medications  enoxaparin (LOVENOX) injection 150 mg (150 mg Subcutaneous Given 04/28/15 1543)  fentaNYL (SUBLIMAZE) injection 50 mcg (50 mcg Intravenous Given 04/28/15 1152)  sodium chloride 0.9 % bolus 500 mL (0 mLs Intravenous Stopped 04/28/15 1306)  hydrocortisone sodium succinate (SOLU-CORTEF) 100 MG injection 200 mg (200 mg Intravenous Given 04/28/15 1202)  diphenhydrAMINE (BENADRYL) injection 50 mg (50 mg Intravenous Given 04/28/15 1202)  iohexol (OMNIPAQUE) 350 MG/ML injection 80 mL (80 mLs Intravenous Contrast Given 04/28/15 1315)    New Prescriptions   ENOXAPARIN (LOVENOX) 100 MG/ML INJECTION    Inject 1.55 mLs (155 mg total) into the skin daily.     Purvis Sheffield, MD 04/29/15 1235

## 2015-04-28 NOTE — ED Notes (Signed)
Pt in c/o shortness of breath since last night, pt delivered a baby on 04/19/15 via c-section, pt states last night she took a PO dilaudid for pain and she isn't sure if that started it, pt c/o pain when she lays back in the bed, pain to right side of chest and back, pt was discharged from the hospital one week ago. Pt speaking in short phrases, appears uncomfortable.

## 2015-04-28 NOTE — Progress Notes (Signed)
ANTICOAGULATION CONSULT NOTE - Initial Consult  Pharmacy Consult for Lovenox Indication: pulmonary embolus  Allergies  Allergen Reactions  . Garlic Anaphylaxis  . Ibuprofen Anaphylaxis and Hives  . Latex Hives  . Peanut-Containing Drug Products Anaphylaxis and Itching  . Shellfish Allergy Anaphylaxis  . Vinegar [Acetic Acid] Anaphylaxis  . Aspirin Other (See Comments)    Reaction:  Causes blood not to clot.  . Banana Hives  . Chocolate Other (See Comments)    Reaction:  Migraines   . Codeine Hives  . Eggs Or Egg-Derived Products Hives and Swelling  . Iodine Hives  . Penicillins Hives and Itching  . Strawberry Hives  . Tylenol [Acetaminophen] Hives  . Citrus Rash  . Pineapple Rash    Patient Measurements: Weight: 225 lb (102.059 kg)  Vital Signs: Temp: 97.8 F (36.6 C) (05/17 1113) Temp Source: Oral (05/17 1113) BP: 135/90 mmHg (05/17 1430) Pulse Rate: 77 (05/17 1430)  Labs:  Recent Labs  04/28/15 1122  HGB 9.3*  HCT 30.3*  PLT 351  CREATININE 0.66    Estimated Creatinine Clearance: 124.9 mL/min (by C-G formula based on Cr of 0.66).   Medical History: Past Medical History  Diagnosis Date  . Headache   . Hypertension   . Multiple allergies   . Vaginal Pap smear, abnormal   . Anemia   . GERD (gastroesophageal reflux disease)   . Depression   . Bipolar 1 disorder     Medications:  Scheduled:  . rivaroxaban   Does not apply Once   Infusions:    Assessment: 34 yo F who recently gave birth on 04/19/15 presenting to ED on 04/28/2015 with SOB and chest and back pain. CTa confirms small subsegmental PE in LLL. Pharmacy has been consulted to dose Lovenox. This option was chosen based on the patient's wish to continue breastfeeding. Lovenox is considered compatible with breastfeeding with  negligible passage into breast milk 2/2 its high molecular weight and inactivation in the GI tract when ingested orally.  H/H low , plt wnl, no reported significant s/s  bleeding.   Goal of Therapy:  Monitor platelets by anticoagulation protocol: Yes   Plan:  - Initiate Lovenox 1.5 mg/kg (150 mg) SQ daily - Monitor renal function, CBC and for s/s bleeding - Counseled patient on dose, side effects and importance of consistent use  Tayler Heiden K. Bonnye FavaNicolsen, PharmD, BCPS Clinical Pharmacist - Resident Pager: [phone] Pharmacy: [phone] 04/28/2015 2:59 PM

## 2015-04-28 NOTE — ED Notes (Signed)
Patient transported to CT 

## 2015-04-29 NOTE — Progress Notes (Signed)
[facility] lov[facility] prescription.  EDCM checked PDMI and saw patient was not entered.  EDCM entered patient into PDMI system.  [facility] [phone] and informed patient was entered.  Joni Reiningicole reports prescription is over 2000 dollars (which exceeds MATCH limit) and is having difficulty understanding the way the prescription was written and requested to speak with doctor who wrote prescription.  ED[facility]  [facility] and to ask to speak to Dr. Romeo AppleHarrison.  No further EDCM needs at this time.

## 2015-04-29 NOTE — Progress Notes (Addendum)
EDCM spoke to Dr. Romeo [facility]Harrison [facility]  Dr. Romeo AppleHarrison reports he did receive the phone call.  EDCM explained to Dr. Romeo AppleHarrison that the amount for lovenox ordered exceeds the cost of [facility] program.  Per Dr. Romeo AppleHarrison, if pharmacy can only fill for 2 weeks worth, that is okay but patient will need to follow up with her doctor.  [facility]CM called [facility] to follow up with cost of lovenox prescription.  Per Joni ReiningNicole, patient's prescription was filled for 12 syringes and will cost patient three dollars.  Joni Reiningicole reports she will speak to pharmacist to ask if 12 syringes is all the [facility] would cover.  Joni Reiningicole will follow up with pharmacist and will call [facility] back.  04/29/2015 A. Zuma Hust [facility] back [facility] stating that [facility] would only allow them to fill for 12 syringes of 150mg  of lovenox.  Discussed with EDP Romeo AppleHarrison who reports that this was acceptable but patient needs at follow with her doctor.  Patient listed as having [facility] administration without pcp.  [facility] patient.  04/29/2015 A. Loomis Anacker RNCM [facility] spoke to Ms. Sobieski, confirmed birthday.  [facility] asked patient if she had a pcp for follow up.  [facility] [place] [facility] [place] [facility] [place] [facility] them first thing in the am.  [facility] asked patient if she thought she would be able to get a follow up visit within 2 weeks?  Patient reports if she cannot, she will go to the health department.  EDCM also provided [facility] with [facility].  EDCM stressed the importance of follow up ASAP regarding her medical condition.  Patient verbalized understanding.  No further EDCM needs at this time.

## 2015-05-08 ENCOUNTER — Telehealth: Payer: Self-pay

## 2015-05-08 NOTE — Telephone Encounter (Addendum)
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 Informed patient letter will be ready for Tuesday 5/31. Patient verbalzied understanding and gratitude and states she will pick it up that Tuesday. Letter printed and signed by Dr. Macon LargeAnyanwu-- placed in drawer with stamps.

## 2015-05-10 ENCOUNTER — Emergency Department (HOSPITAL_COMMUNITY): Payer: Non-veteran care

## 2015-05-10 ENCOUNTER — Encounter (HOSPITAL_COMMUNITY): Payer: Self-pay | Admitting: *Deleted

## 2015-05-10 ENCOUNTER — Observation Stay (HOSPITAL_COMMUNITY)
Admission: EM | Admit: 2015-05-10 | Discharge: 2015-05-11 | Disposition: A | Discharge status: Home / Self Care | Payer: Non-veteran care | Attending: Oncology | Admitting: Oncology

## 2015-05-10 DIAGNOSIS — K8020 Calculus of gallbladder without cholecystitis without obstruction: Secondary | ICD-10-CM

## 2015-05-10 DIAGNOSIS — M549 Dorsalgia, unspecified: Secondary | ICD-10-CM | POA: Diagnosis not present

## 2015-05-10 DIAGNOSIS — I2699 Other pulmonary embolism without acute cor pulmonale: Secondary | ICD-10-CM | POA: Diagnosis not present

## 2015-05-10 DIAGNOSIS — Z87891 Personal history of nicotine dependence: Secondary | ICD-10-CM | POA: Insufficient documentation

## 2015-05-10 DIAGNOSIS — Z9889 Other specified postprocedural states: Secondary | ICD-10-CM

## 2015-05-10 DIAGNOSIS — D62 Acute posthemorrhagic anemia: Secondary | ICD-10-CM | POA: Diagnosis not present

## 2015-05-10 DIAGNOSIS — I10 Essential (primary) hypertension: Secondary | ICD-10-CM

## 2015-05-10 DIAGNOSIS — E876 Hypokalemia: Secondary | ICD-10-CM

## 2015-05-10 DIAGNOSIS — K219 Gastro-esophageal reflux disease without esophagitis: Secondary | ICD-10-CM | POA: Insufficient documentation

## 2015-05-10 HISTORY — DX: Other pulmonary embolism without acute cor pulmonale: I26.99

## 2015-05-10 LAB — CBC WITH DIFFERENTIAL/PLATELET
BASOS PCT: 0 % (ref 0–1)
Basophils Absolute: 0 10*3/uL (ref 0.0–0.1)
EOS PCT: 1 % (ref 0–5)
Eosinophils Absolute: 0 10*3/uL (ref 0.0–0.7)
HEMATOCRIT: 34 % — AB (ref 36.0–46.0)
Hemoglobin: 10.3 g/dL — ABNORMAL LOW (ref 12.0–15.0)
Lymphocytes Relative: 16 % (ref 12–46)
Lymphs Abs: 1 10*3/uL (ref 0.7–4.0)
MCH: 26.3 pg (ref 26.0–34.0)
MCHC: 30.3 g/dL (ref 30.0–36.0)
MCV: 86.7 fL (ref 78.0–100.0)
Monocytes Absolute: 0.4 10*3/uL (ref 0.1–1.0)
Monocytes Relative: 7 % (ref 3–12)
NEUTROS ABS: 4.8 10*3/uL (ref 1.7–7.7)
Neutrophils Relative %: 76 % (ref 43–77)
PLATELETS: 450 10*3/uL — AB (ref 150–400)
RBC: 3.92 MIL/uL (ref 3.87–5.11)
RDW: 15.4 % (ref 11.5–15.5)
WBC: 6.3 10*3/uL (ref 4.0–10.5)

## 2015-05-10 LAB — COMPREHENSIVE METABOLIC PANEL
ALK PHOS: 91 U/L (ref 38–126)
ALT: 24 U/L (ref 14–54)
AST: 27 U/L (ref 15–41)
Albumin: 3.9 g/dL (ref 3.5–5.0)
Anion gap: 10 (ref 5–15)
BUN: 7 mg/dL (ref 6–20)
CHLORIDE: 107 mmol/L (ref 101–111)
CO2: 24 mmol/L (ref 22–32)
CREATININE: 0.74 mg/dL (ref 0.44–1.00)
Calcium: 9.5 mg/dL (ref 8.9–10.3)
GFR calc Af Amer: >60 mL/min (ref >60)
Glucose, Bld: 89 mg/dL (ref 65–99)
POTASSIUM: 3.3 mmol/L — AB (ref 3.5–5.1)
Sodium: 141 mmol/L (ref 135–145)
Total Bilirubin: 0.6 mg/dL (ref 0.3–1.2)
Total Protein: 7.2 g/dL (ref 6.5–8.1)

## 2015-05-10 LAB — LIPASE, BLOOD: LIPASE: 26 U/L (ref 22–51)

## 2015-05-10 LAB — TROPONIN I

## 2015-05-10 MED ORDER — WARFARIN - PHYSICIAN DOSING INPATIENT: Freq: Every day | Status: DC

## 2015-05-10 MED ORDER — SODIUM CHLORIDE 0.9 % IJ SOLN
3.0000 mL | Freq: Two times a day (BID) | INTRAMUSCULAR | Status: DC
  Administered 2015-05-10 – 2015-05-11 (×2): 3 mL via INTRAVENOUS

## 2015-05-10 MED ORDER — WARFARIN SODIUM 5 MG PO TABS
5.0000 mg | ORAL_TABLET | Freq: Once | ORAL | Status: AC
Start: 2015-05-10 — End: 2015-05-10
  Administered 2015-05-10: 5 mg via ORAL
  Filled 2015-05-10: qty 1

## 2015-05-10 MED ORDER — ENOXAPARIN SODIUM 100 MG/ML ~~LOC~~ SOLN
100.0000 mg | Freq: Two times a day (BID) | SUBCUTANEOUS | Status: DC
  Administered 2015-05-10 – 2015-05-11 (×2): 100 mg via SUBCUTANEOUS
  Filled 2015-05-10 (×4): qty 1

## 2015-05-10 MED ORDER — IOHEXOL 350 MG/ML SOLN
90.0000 mL | Freq: Once | INTRAVENOUS | Status: AC | PRN
  Administered 2015-05-10: 100 mL via INTRAVENOUS

## 2015-05-10 NOTE — ED Notes (Signed)
Dinner tray ordered.

## 2015-05-10 NOTE — ED Notes (Signed)
Admitting MD at bedside.

## 2015-05-10 NOTE — ED Notes (Signed)
Patient was given apple juice and cranberry juice with a cup of ice.

## 2015-05-10 NOTE — ED Notes (Signed)
Pt was seen here two weeks ago for pain with deep breath and was diagnosed with blood clot in her lungs. Pt states the pain has returned and is currently on a blood thinner. Pt is on lovenox shots because she is currently breast feeding her baby.  Pt reports vomiting as well.  Pt states hurts on the right side

## 2015-05-10 NOTE — ED Notes (Signed)
Patient transported to Ultrasound 

## 2015-05-10 NOTE — ED Notes (Signed)
Patient returned from Ultrasound. 

## 2015-05-10 NOTE — Progress Notes (Signed)
Received report from Emily,RN-ED.

## 2015-05-10 NOTE — ED Notes (Signed)
Patient returned from CT

## 2015-05-10 NOTE — Progress Notes (Signed)
Danielle Estes [id number] Admission Data: 05/10/2015 8:54 PM Attending Provider: Levert FeinsteinJames M Granfortuna, MD PCP:No PCP Per Patient Code Status: Full  Danielle Estes is a 34 y.o. female patient admitted from ED:  -No acute distress noted.  -No complaints of shortness of breath.  -No complaints of chest pain.   Cardiac Monitoring: Box # 30 in place. Cardiac monitor yields:normal sinus rhythm.  Blood pressure 131/89, pulse 86, temperature 98.1 F (36.7 C), temperature source Oral, resp. rate 17, weight 99.9 kg (220 lb 3.8 oz), SpO2 100 %, currently breastfeeding.   IV Fluids:  IV in place, occlusive dsg intact without redness, IV cath antecubital left, condition patent and no redness none.   Allergies:  Garlic; Ibuprofen; Latex; Peanut-containing drug products; Shellfish allergy; Vinegar; Aspirin; Banana; Chocolate; Codeine; Eggs or egg-derived products; Iodine; Penicillins; Strawberry; Tylenol; Citrus; and Pineapple  Past Medical History:   has a past medical history of Headache; Hypertension; Multiple allergies; Vaginal Pap smear, abnormal; Anemia; GERD (gastroesophageal reflux disease); Depression; Bipolar 1 disorder; and Pulmonary embolism.  Past Surgical History:   has past surgical history that includes Cesarean section; Breast surgery; and Cesarean section (N/A, 04/19/2015).  Social History:   reports that she has quit smoking. She has never used smokeless tobacco. She reports that she uses illicit drugs (Marijuana). She reports that she does not drink alcohol.  Skin: NSI  Patient/Family orientated to room. Information packet given to patient/family. Admission inpatient armband information verified with patient/family to include name and date of birth and placed on patient arm. Side rails up x 2, fall assessment and education completed with patient/family. Patient/family able to verbalize understanding of risk associated with falls and verbalized understanding to call for assistance  before getting out of bed. Call light within reach. Patient/family able to voice and demonstrate understanding of unit orientation instructions.

## 2015-05-10 NOTE — ED Notes (Signed)
Patient transported to CT 

## 2015-05-10 NOTE — H&P (Signed)
Date: 05/10/2015               Patient Name:  Danielle Estes MRN: [id number]  DOB: 09/20/1981 Age / Sex: 34 y.o., female   PCP: No Pcp Per Patient         Medical Service: Internal Medicine Teaching Service         Attending Physician: Dr. Levert Feinstein, MD    First Contact: Dr. Rich Number Pager: [phone]  Second Contact: Dr. Carlynn Purl  Pager: [phone]       After Hours (After 5p/  First Contact Pager: [phone]  weekends / holidays): Second Contact Pager: [phone]   Chief Complaint: Right back pain/upper abdominal pain  History of Present Illness: Danielle Estes is a 33yo woman with PMHx of HTN, GERD, depression, and bipolar 1 disorder who presents to the ED with recurrent upper right back pain and right sided abdominal pain. Patient underwent a C-section on 04/19/15 and then was seen in the ED on 04/28/15 for shortness of breath found to have a PE in the left lower lobe on CTA. She was discharged from the ED on Lovenox injections (1.5 mg/kg daily) as she wished to continue to breast feed her baby. She reports compliance with her Lovenox injections. She states she started to have sharp right upper back/right upper abdominal pain this morning and was worried because the pain felt similar to when she was diagnosed with PE over a week ago. She notes worsening pain when taking a deep breath and that the pain improves when she bends forward. She denies any shortness of breath. She also notes nausea and vomiting x 1. She reports pain in her right leg, but denies any swelling.   In the ED she had a repeat CTA which showed a new subsegmental PE in the right lung.   Meds: No current facility-administered medications for this encounter.   Current Outpatient Prescriptions  Medication Sig Dispense Refill  . enoxaparin (LOVENOX) 100 MG/ML injection Inject 1.55 mLs (155 mg total) into the skin daily. (Patient taking differently: Inject 100 mg into the skin daily. Nightly at 5pm) 20 mL 0  .  ferrous sulfate 325 (65 FE) MG tablet Take 325 mg by mouth 3 (three) times daily with meals.     . triamcinolone ointment (KENALOG) 0.5 % Apply 1 application topically 2 (two) times daily. For eczema    . amLODipine (NORVASC) 10 MG tablet Take 1 tablet (10 mg total) by mouth at bedtime. (Patient not taking: Reported on 04/28/2015) 30 tablet 1  . HYDROmorphone (DILAUDID) 2 MG tablet Take 1 tablet (2 mg total) by mouth every 6 (six) hours as needed for severe pain. (Patient not taking: Reported on 05/10/2015) 15 tablet 0    Allergies: Allergies as of 05/10/2015 - Review Complete 05/10/2015  Allergen Reaction Noted  . Garlic Anaphylaxis 01/07/2015  . Ibuprofen Anaphylaxis and Hives 04/28/2015  . Latex Hives 01/07/2015  . Peanut-containing drug products Anaphylaxis and Itching 03/03/2015  . Shellfish allergy Anaphylaxis 10/15/2014  . Vinegar [acetic acid] Anaphylaxis 10/15/2014  . Aspirin Other (See Comments) 10/15/2014  . Banana Hives 10/15/2014  . Chocolate Other (See Comments) 03/03/2015  . Codeine Hives 10/15/2014  . Eggs or egg-derived products Hives and Swelling 02/19/2015  . Iodine Hives 01/07/2015  . Penicillins Hives and Itching 10/15/2014  . Strawberry Hives 10/15/2014  . Tylenol [acetaminophen] Hives 10/15/2014  . Citrus Rash 03/25/2015  . Pineapple Rash 03/25/2015   Past Medical History  Diagnosis Date  .  Headache   . Hypertension   . Multiple allergies   . Vaginal Pap smear, abnormal   . Anemia   . GERD (gastroesophageal reflux disease)   . Depression   . Bipolar 1 disorder   . Pulmonary embolism    Past Surgical History  Procedure Laterality Date  . Cesarean section      C/S x 1  . Breast surgery      had lumped removed at age 3. non cancer  . Cesarean section N/A 04/19/2015    Procedure: CESAREAN SECTION;  Surgeon: Adam Phenix, MD;  Location: [facility];  Service: Obstetrics;  Laterality: N/A;   Family History  Problem Relation Age of Onset  . Hypertension  Maternal Aunt   . Cancer Maternal Aunt   . Heart disease Maternal Grandmother    History   Social History  . Marital Status: Married    Spouse Name: N/A  . Number of Children: N/A  . Years of Education: N/A   Occupational History  . Not on file.   Social History Main Topics  . Smoking status: Former Games developer  . Smokeless tobacco: Never Used  . Alcohol Use: No  . Drug Use: Yes    Special: Marijuana     Comment: quit 2015  . Sexual Activity: Yes    Birth Control/ Protection: None   Other Topics Concern  . Not on file   Social History Narrative    Review of Systems: General: Denies fever, chills, night sweats, changes in weight, changes in appetite HEENT: Denies headaches, ear pain, changes in vision, rhinorrhea, sore throat CV: Denies palpitations, orthopnea Pulm: Denies cough, wheezing GI: Denies diarrhea, constipation, melena, hematochezia GU: Denies dysuria, hematuria, frequency Msk: Denies muscle cramps, joint pains Neuro: Denies weakness, numbness, tingling Skin: Denies rashes, bruising  Physical Exam: Blood pressure 126/95, pulse 82, temperature 98.4 F (36.9 C), temperature source Oral, resp. rate 12, SpO2 100 %, currently breastfeeding. General: young woman sitting up in bed, NAD HEENT: [place]/AT, EOMI, sclera anicteric, mucus membranes moist CV: RRR, no m/g/r Pulm: CTA bilaterally, breaths non-labored Abd: BS+, soft, obese, non-tender Ext: warm, no edema. Mild tenderness to palpation of right calf. Lower extremities appear symmetric.  Neuro: alert and oriented x 3, no focal deficits  Lab results: Basic Metabolic Panel:  Recent Labs  16/10/96 1250  NA 141  K 3.3*  CL 107  CO2 24  GLUCOSE 89  BUN 7  CREATININE 0.74  CALCIUM 9.5   Liver Function Tests:  Recent Labs  05/10/15 1250  AST 27  ALT 24  ALKPHOS 91  BILITOT 0.6  PROT 7.2  ALBUMIN 3.9    Recent Labs  05/10/15 1250  LIPASE 26   CBC:  Recent Labs  05/10/15 1250  WBC 6.3    NEUTROABS 4.8  HGB 10.3*  HCT 34.0*  MCV 86.7  PLT 450*   Cardiac Enzymes:  Recent Labs  05/10/15 1250  TROPONINI <0.03   Imaging results:  Ct Angio Chest Pe W/cm &/or Wo Cm  05/10/2015   CLINICAL DATA:  Right-sided chest pain for 3 weeks after Cesarean section with shortness of breath, recently diagnosed with pulmonary embolism well on Lovenox ;STUDY WAS DISCUSSED WITH DR. Oleta Mouse PRIOR TO SCAN RE: QUESTIONALBLE ALLERGY, (PT. GIVEN BENADRYL PRIOR TO SCAN ON 5-17), NO PREMEDS REQUIRED FOR TODAY'S CT ANGIO CHEST,  EXAM: CT ANGIOGRAPHY CHEST WITH CONTRAST  TECHNIQUE: Multidetector CT imaging of the chest was performed using the standard protocol during bolus administration of  intravenous contrast. Multiplanar CT image reconstructions and MIPs were obtained to evaluate the vascular anatomy.  CONTRAST:  100mL OMNIPAQUE IOHEXOL 350 MG/ML SOLN  COMPARISON:  04/28/2015  FINDINGS: On the prior study there was subsegmental pulmonary embolus to the left lower lobe. That has resolved. However on the current study there is subsegmental embolus 2 and artery extending into the right lower lobe. This involves a peripheral branch involving the posterior medial right lung base. No other emboli are detected.  Thoracic aorta shows no dissection or dilatation. There is no pleural or pericardial effusion. There is mild bilateral dependent atelectasis with no evidence of abnormal pulmonary parenchymal opacification otherwise.  There is no significant hilar or mediastinal adenopathy. Thoracic inlet is normal. No acute musculoskeletal findings. No acute abnormalities in the visualized portion of the upper abdomen.  Review of the MIP images confirms the above findings.  IMPRESSION: Although left pulmonary embolus has resolved, there is a new subsegmental pulmonary embolus involving the right lung.   Electronically Signed   By: Esperanza Heiraymond  Rubner M.D.   On: 05/10/2015 15:09   [place] Abdomen Limited  05/10/2015   CLINICAL DATA:   Right upper quadrant pain since 7 a.m. this morning.  EXAM: US ABDOMEN LIMITED - RIGHT UPPER QUADRANT  COMPARISON:  None.  FINDINGS: Gallbladder:  Dependent, discoid shaped, echogenic focus, which is likely a stone. No wall thickening. No pericholecystic fluid. No evidence of acute cholecystitis.  Common bile duct:  Diameter: 2 mm  Liver:  No focal lesion identified. Within normal limits in parenchymal echogenicity.  IMPRESSION: 1. Cholelithiasis without evidence of acute cholecystitis. 2. No other abnormality.   Electronically Signed   By: Amie Portlandavid  Ormond M.D.   On: 05/10/2015 13:16    Other results: EKG: normal sinus rhythm, nonspecific T wave changes  Assessment & Plan by Problem:  Recurrent Pulmonary Embolism: Patient presented with a 1 day hx of right upper back and right-sided abdominal pain in the setting of recent pregnancy/C-section and left PE diagnosed over 1 week ago while on Lovenox 1.5 mg/kg daily. She was found to have another provoked PE on the right side on CTA chest. Given patient would like to continue breast feeding will switch anticoagulation to warfarin as this does not enter the breast milk.  - Admit to telemetry bed - Start warfarin 5 mg x 1 - Bridge with Lovenox 100 mg Q12H - Monitor vital signs   Cholelithiasis without Cholecystitis: Incidentally found on abdominal US. Patient without symptoms. Her right sided abdominal pain is most likely related to her acute PE.   Hypokalemia: K 3.3 on admission. Will hold on supplementing K since giving warfarin for anticoagulation.  - Check bmet in AM  HTN: BP stable at 126/95. Patient takes Amlodipine 10 mg QHS at home.  - Hold Amlodipine as she is currently normotensive   Post-op Anemia Due to Acute Blood Loss: Hbg 10.3 on admission. She reports she was told by her OBGYN that she lost about 2 units of blood during her C-section on 04/19/15. Baseline Hbg appears to be around 11. Her anemia is most likely related to her recent acute  blood loss. She is on iron supplementation at home.  - Continue to monitor    Diet: Regular  VTE PPx: Start warfarin 5 mg x 1 plus bridge with Lovenox 1 mg/kg Q12H Dispo: Disposition is deferred at this time, awaiting improvement of current medical problems. Anticipated discharge in approximately 1-2 day(s).   The patient does not have a current  PCP (No Pcp Per Patient) and does need an [facility] hospital follow-up appointment after discharge.  The patient does not have transportation limitations that hinder transportation to clinic appointments.  Signed: Su Hoff, MD 05/10/2015, 5:10 PM

## 2015-05-10 NOTE — ED Provider Notes (Signed)
CSN: [id number]     Arrival date & time 05/10/15  1200 History   First MD Initiated Contact with Patient 05/10/15 1209     Chief Complaint  Patient presents with  . Shortness of Breath    Hurts to breath     (Consider location/radiation/quality/duration/timing/severity/associated sxs/prior Treatment) HPI  34 year old female presents with recurrent right back and chest/upper abdominal pain. States this feels similar to when she was diagnosed with a pulmonary embolism on 5/17. She delivered a baby on 5/8. She is currently been on Lovenox once daily. She woke up this morning after not having pain for over a week with pain at around 7 AM. The patient's pain feels sharp and worsens with inspiration. There is no shortness of breath. There is no cough or hemoptysis. Does not worsen to eat or drink. Patient had one episode of vomiting but currently does not feel nauseated. She took an extra Lovenox injection this morning instead of typically at 5 PM because she was hurting and now feels a little bit better. Rates her pain as a 5/10. No leg swelling.  Past Medical History  Diagnosis Date  . Headache   . Hypertension   . Multiple allergies   . Vaginal Pap smear, abnormal   . Anemia   . GERD (gastroesophageal reflux disease)   . Depression   . Bipolar 1 disorder   . Pulmonary embolism    Past Surgical History  Procedure Laterality Date  . Cesarean section      C/S x 1  . Breast surgery      had lumped removed at age 34. non cancer  . Cesarean section N/A 04/19/2015    Procedure: CESAREAN SECTION;  Surgeon: Adam PhenixJames G Arnold, MD;  Location: [facility];  Service: Obstetrics;  Laterality: N/A;   Family History  Problem Relation Age of Onset  . Hypertension Maternal Aunt   . Cancer Maternal Aunt   . Heart disease Maternal Grandmother    History  Substance Use Topics  . Smoking status: Former Games developermoker  . Smokeless tobacco: Never Used  . Alcohol Use: No   OB History    Gravida Para Term Preterm  AB TAB SAB Ectopic Multiple Living   4 4 4  0 0 0 0 0 0 4     Review of Systems  Constitutional: Negative for fever.  Respiratory: Negative for shortness of breath.   Cardiovascular: Positive for chest pain.  Gastrointestinal: Positive for vomiting and abdominal pain. Negative for nausea.  Genitourinary: Negative for dysuria.  Musculoskeletal: Positive for back pain.  All other systems reviewed and are negative.     Allergies  Garlic; Ibuprofen; Latex; Peanut-containing drug products; Shellfish allergy; Vinegar; Aspirin; Banana; Chocolate; Codeine; Eggs or egg-derived products; Iodine; Penicillins; Strawberry; Tylenol; Citrus; and Pineapple  Home Medications   Prior to Admission medications   Medication Sig Start Date End Date Taking? Authorizing Provider  amLODipine (NORVASC) 10 MG tablet Take 1 tablet (10 mg total) by mouth at bedtime. Patient not taking: Reported on 04/28/2015 04/21/15   Ashly M Gottschalk, DO  enoxaparin (LOVENOX) 100 MG/ML injection Inject 1.55 mLs (155 mg total) into the skin daily. 04/28/15   Purvis SheffieldForrest Harrison, MD  ferrous sulfate 325 (65 FE) MG tablet Take 325 mg by mouth daily with breakfast.    Historical Provider, MD  HYDROmorphone (DILAUDID) 2 MG tablet Take 1 tablet (2 mg total) by mouth every 6 (six) hours as needed for severe pain. 04/21/15   Raliegh IpAshly M Gottschalk, DO  ibuprofen (ADVIL,MOTRIN) 600 MG tablet Take 1 tablet (600 mg total) by mouth every 6 (six) hours. Patient not taking: Reported on 04/28/2015 04/21/15   Raliegh Ip, DO  triamcinolone ointment (KENALOG) 0.5 % Apply 1 application topically 2 (two) times daily.    Historical Provider, MD   BP 116/86 mmHg  Pulse 78  Temp(Src) 97.9 F (36.6 C) (Oral)  Resp 16  SpO2 99%  LMP   Breastfeeding? Yes Physical Exam  Constitutional: She is oriented to person, place, and time. She appears well-developed and well-nourished. No distress.  HENT:  Head: Normocephalic and atraumatic.  Right Ear:  External ear normal.  Left Ear: External ear normal.  Nose: Nose normal.  Eyes: Right eye exhibits no discharge. Left eye exhibits no discharge.  Cardiovascular: Normal rate, regular rhythm and normal heart sounds.   Pulmonary/Chest: Effort normal and breath sounds normal. She exhibits no tenderness.  Abdominal: Soft. There is tenderness in the right upper quadrant. There is negative Murphy's sign.  Musculoskeletal:       Cervical back: She exhibits no tenderness.       Thoracic back: She exhibits no tenderness.       Lumbar back: She exhibits no tenderness.  Neurological: She is alert and oriented to person, place, and time.  Skin: Skin is warm and dry. She is not diaphoretic.  Nursing note and vitals reviewed.   ED Course  Procedures (including critical care time) Labs Review Labs Reviewed  COMPREHENSIVE METABOLIC PANEL - Abnormal; Notable for the following:    Potassium 3.3 (*)    All other components within normal limits  CBC WITH DIFFERENTIAL/PLATELET - Abnormal; Notable for the following:    Hemoglobin 10.3 (*)    HCT 34.0 (*)    Platelets 450 (*)    All other components within normal limits  LIPASE, BLOOD  TROPONIN I    Imaging Review Ct Angio Chest Pe W/cm &/or Wo Cm  05/10/2015   CLINICAL DATA:  Right-sided chest pain for 3 weeks after Cesarean section with shortness of breath, recently diagnosed with pulmonary embolism well on Lovenox ;STUDY WAS DISCUSSED WITH DR. Oleta Mouse PRIOR TO SCAN RE: QUESTIONALBLE ALLERGY, (PT. GIVEN BENADRYL PRIOR TO SCAN ON 5-17), NO PREMEDS REQUIRED FOR TODAY'S CT ANGIO CHEST,  EXAM: CT ANGIOGRAPHY CHEST WITH CONTRAST  TECHNIQUE: Multidetector CT imaging of the chest was performed using the standard protocol during bolus administration of intravenous contrast. Multiplanar CT image reconstructions and MIPs were obtained to evaluate the vascular anatomy.  CONTRAST:  OMNIPAQUE IOHEXOL 350 MG/ML SOLN  COMPARISON:  04/28/2015  FINDINGS: On the  prior study there was subsegmental pulmonary embolus to the left lower lobe. That has resolved. However on the current study there is subsegmental embolus 2 and artery extending into the right lower lobe. This involves a peripheral branch involving the posterior medial right lung base. No other emboli are detected.  Thoracic aorta shows no dissection or dilatation. There is no pleural or pericardial effusion. There is mild bilateral dependent atelectasis with no evidence of abnormal pulmonary parenchymal opacification otherwise.  There is no significant hilar or mediastinal adenopathy. Thoracic inlet is normal. No acute musculoskeletal findings. No acute abnormalities in the visualized portion of the upper abdomen.  Review of the MIP images confirms the above findings.  IMPRESSION: Although left pulmonary embolus has resolved, there is a new subsegmental pulmonary embolus involving the right lung.   Electronically Signed   By: Esperanza Heir M.D.   On: 05/10/2015 15:09  US Abdomen Limited  05/10/2015   CLINICAL DATA:  Right upper quadrant pain since 7 a.m. this morning.  EXAM: US ABDOMEN LIMITED - RIGHT UPPER QUADRANT  COMPARISON:  None.  FINDINGS: Gallbladder:  Dependent, discoid shaped, echogenic focus, which is likely a stone. No wall thickening. No pericholecystic fluid. No evidence of acute cholecystitis.  Common bile duct:  Diameter: 2 mm  Liver:  No focal lesion identified. Within normal limits in parenchymal echogenicity.  IMPRESSION: 1. Cholelithiasis without evidence of acute cholecystitis. 2. No other abnormality.   Electronically Signed   By: Amie Portland M.D.   On: 05/10/2015 13:16     EKG Interpretation   Date/Time:  Sunday May 10 2015 12:04:51 EDT Ventricular Rate:  90 PR Interval:  172 QRS Duration: 78 QT Interval:  352 QTC Calculation: 430 R Axis:   47 Text Interpretation:  Normal sinus rhythm Nonspecific T wave abnormality  Abnormal ECG nonspecific T waves appear unchanged  Confirmed by Darnise Montag   MD, Demitris Pokorny ([id number]) on 05/10/2015 12:12:14 PM      MDM   Final diagnoses:  Pulmonary embolism on right  Cholelithiasis without cholecystitis    Patient's Left PE has resolved, now has a subsegmental PE on right. Given this developed on lovenox, will need admission for IV heparin and possibly new anticoagulation and workup. Stable without hypoxia or hypotension at this time.    Pricilla Loveless, MD 05/10/15 [phone]

## 2015-05-11 DIAGNOSIS — I2699 Other pulmonary embolism without acute cor pulmonale: Secondary | ICD-10-CM | POA: Diagnosis not present

## 2015-05-11 DIAGNOSIS — E876 Hypokalemia: Secondary | ICD-10-CM | POA: Diagnosis not present

## 2015-05-11 DIAGNOSIS — K8020 Calculus of gallbladder without cholecystitis without obstruction: Secondary | ICD-10-CM | POA: Diagnosis not present

## 2015-05-11 DIAGNOSIS — D62 Acute posthemorrhagic anemia: Secondary | ICD-10-CM | POA: Diagnosis not present

## 2015-05-11 LAB — BASIC METABOLIC PANEL
Anion gap: 10 (ref 5–15)
BUN: <5 mg/dL — ABNORMAL LOW (ref 6–20)
CO2: 23 mmol/L (ref 22–32)
Calcium: 9 mg/dL (ref 8.9–10.3)
Chloride: 108 mmol/L (ref 101–111)
Creatinine, Ser: 0.89 mg/dL (ref 0.44–1.00)
GFR calc Af Amer: >60 mL/min (ref >60)
Glucose, Bld: 107 mg/dL — ABNORMAL HIGH (ref 65–99)
POTASSIUM: 3.3 mmol/L — AB (ref 3.5–5.1)
SODIUM: 141 mmol/L (ref 135–145)

## 2015-05-11 MED ORDER — COUMADIN BOOK
Freq: Once | Status: AC
  Administered 2015-05-11: 10:00:00
  Filled 2015-05-11: qty 1

## 2015-05-11 MED ORDER — WARFARIN SODIUM 5 MG PO TABS: 5.0000 mg | ORAL_TABLET | Freq: Every day | ORAL | Status: DC

## 2015-05-11 MED ORDER — ENOXAPARIN SODIUM 100 MG/ML ~~LOC~~ SOLN: 1.5000 mg/kg | SUBCUTANEOUS | Status: DC

## 2015-05-11 MED ORDER — WARFARIN VIDEO
Freq: Once | Status: AC
  Administered 2015-05-11: 10:00:00

## 2015-05-11 NOTE — Discharge Instructions (Signed)
It was a pleasure taking care of you, Danielle Estes.  - Start taking coumadin 5 mg daily at 6PM tonight (05/11/15) - Continue taking your Lovenox injections 155 mg daily as you have been doing - You will be seen in the Internal Medicine Clinic this Wed (05/13/15) or Thurs (05/14/15) to check your INR level which should be in the 2-3 range as we discussed - If your INR level is between 2-3 then you can likely stop the Lovenox injections. This will be discussed at your appointment.   Take care, Dr. Beckie Saltsivet

## 2015-05-11 NOTE — Discharge Summary (Signed)
Name: Danielle Estes MRN: [id number] DOB: 08/10/1981 34 y.o. PCP: Pennie Rushing  Date of Admission: 05/10/2015 12:08 PM Date of Discharge: 05/11/2015 Attending Physician: Levert Feinstein, MD  Discharge Diagnosis: Principal Problem Pulmonary Embolism Active Problems Cholelithiasis without Cholecystitis Hypokalemia HTN Post-op Anemia Due to Acute Blood Loss    Discharge Medications:   Medication List    STOP taking these medications        HYDROmorphone 2 MG tablet  Commonly known as:  DILAUDID      TAKE these medications        amLODipine 10 MG tablet  Commonly known as:  NORVASC  Take 1 tablet (10 mg total) by mouth at bedtime.     enoxaparin 100 MG/ML injection  Commonly known as:  LOVENOX  Inject 1.55 mLs (155 mg total) into the skin daily.     ferrous sulfate 325 (65 FE) MG tablet  Take 325 mg by mouth 3 (three) times daily with meals.     triamcinolone ointment 0.5 %  Commonly known as:  KENALOG  Apply 1 application topically 2 (two) times daily. For eczema     warfarin 5 MG tablet  Commonly known as:  COUMADIN  Take 1 tablet (5 mg total) by mouth daily at 6 PM.        Disposition and follow-up:   Danielle Estes was discharged from [facility] in Good condition.  At the hospital follow up visit please address:  1.  Pulm Embolism- Check PT/INR. Patient needs to be set up in coumadin clinic with Dr. Alexandria Lodge until more definite arrangements can be set up at [facility]. Determine if Lovenox needs to be continued based on INR. HypoK- Check bmet HTN-  BP recheck.  Post op Anemia- Check CBC  2.  Labs / imaging needed at time of follow-up: PT/INR, CBC, bmet  3.  Pending labs/ test needing follow-up: None   Follow-up Appointments:   Discharge Instructions:     Discharge Instructions    Diet - low sodium heart healthy    Complete by:  As directed      Increase activity slowly    Complete by:  As directed              Consultations:    Procedures Performed:  Ct Angio Chest Pe W/cm &/or Wo Cm  05/11/2015   ADDENDUM REPORT: 05/11/2015 08:40  ADDENDUM: Imaging was reviewed with Dr. Cyndie Chime and the treating medical staff. Right lower lobe subsegmental pulmonary embolus is very peripheral and small measuring approximately 3 mm. Thrombus burden within the pulmonary vasculature is trace to very minimal.   Electronically Signed   By: Judie Petit.  Shick M.D.   On: 05/11/2015 08:40   05/11/2015   CLINICAL DATA:  Right-sided chest pain for 3 weeks after Cesarean section with shortness of breath, recently diagnosed with pulmonary embolism well on Lovenox ;STUDY WAS DISCUSSED WITH DR. Oleta Mouse PRIOR TO SCAN RE: QUESTIONALBLE ALLERGY, (PT. GIVEN BENADRYL PRIOR TO SCAN ON 5-17), NO PREMEDS REQUIRED FOR TODAY'S CT ANGIO CHEST,  EXAM: CT ANGIOGRAPHY CHEST WITH CONTRAST  TECHNIQUE: Multidetector CT imaging of the chest was performed using the standard protocol during bolus administration of intravenous contrast. Multiplanar CT image reconstructions and MIPs were obtained to evaluate the vascular anatomy.  CONTRAST:  OMNIPAQUE IOHEXOL 350 MG/ML SOLN  COMPARISON:  04/28/2015  FINDINGS: On the prior study there was subsegmental pulmonary embolus to the left lower lobe. That has resolved. However  on the current study there is subsegmental embolus 2 and artery extending into the right lower lobe. This involves a peripheral branch involving the posterior medial right lung base. No other emboli are detected.  Thoracic aorta shows no dissection or dilatation. There is no pleural or pericardial effusion. There is mild bilateral dependent atelectasis with no evidence of abnormal pulmonary parenchymal opacification otherwise.  There is no significant hilar or mediastinal adenopathy. Thoracic inlet is normal. No acute musculoskeletal findings. No acute abnormalities in the visualized portion of the upper abdomen.  Review of the MIP images  confirms the above findings.  IMPRESSION: Although left pulmonary embolus has resolved, there is a new subsegmental pulmonary embolus involving the right lung.  Electronically Signed: By: Esperanza Heir M.D. On: 05/10/2015 15:09   Ct Angio Chest Pe W/cm &/or Wo Cm  04/28/2015   CLINICAL DATA:  Shortness of breath  EXAM: CT ANGIOGRAPHY CHEST WITH CONTRAST  TECHNIQUE: Multidetector CT imaging of the chest was performed using the standard protocol during bolus administration of intravenous contrast. Multiplanar CT image reconstructions and MIPs were obtained to evaluate the vascular anatomy.  CONTRAST:  80mL OMNIPAQUE IOHEXOL 350 MG/ML SOLN  COMPARISON:  None.  FINDINGS: There is adequate opacification of the pulmonary arteries. There is a small subsegmental pulmonary embolus in the left lower lobe (image 76/series 7). The main pulmonary artery, right main pulmonary artery and left main pulmonary arteries are normal in size. The heart size is normal. There is no pericardial effusion.  There are trace bilateral pleural effusions. There is left basilar airspace disease likely reflecting atelectasis. There is no pneumothorax.  There is no axillary, hilar, or mediastinal adenopathy.  There is no lytic or blastic osseous lesion.  The visualized portions of the upper abdomen are unremarkable.  Review of the MIP images confirms the above findings.  IMPRESSION: 1. Small subsegmental pulmonary embolus in the left lower lobe. 2. Trace bilateral pleural effusions. Critical Value/emergent results were called by telephone at the time of interpretation on 04/28/2015 at 1:44 pm to Dr. Purvis Sheffield , who verbally acknowledged these results.   Electronically Signed   By: Elige Ko   On: 04/28/2015 13:45   US Abdomen Limited  05/10/2015   CLINICAL DATA:  Right upper quadrant pain since 7 a.m. this morning.  EXAM: US ABDOMEN LIMITED - RIGHT UPPER QUADRANT  COMPARISON:  None.  FINDINGS: Gallbladder:  Dependent, discoid  shaped, echogenic focus, which is likely a stone. No wall thickening. No pericholecystic fluid. No evidence of acute cholecystitis.  Common bile duct:  Diameter: 2 mm  Liver:  No focal lesion identified. Within normal limits in parenchymal echogenicity.  IMPRESSION: 1. Cholelithiasis without evidence of acute cholecystitis. 2. No other abnormality.   Electronically Signed   By: Amie Portland M.D.   On: 05/10/2015 13:16   Dg Chest Port 1 View  04/28/2015   CLINICAL DATA:  Shortness of breath, C-section and 04/19/2015  EXAM: PORTABLE CHEST - 1 VIEW  COMPARISON:  07/28/2011  FINDINGS: Cardiomediastinal silhouette is unremarkable. No acute infiltrate or pleural effusion. No pulmonary edema. Bony thorax is unremarkable.  IMPRESSION: No active disease.   Electronically Signed   By: Natasha Mead M.D.   On: 04/28/2015 11:31   Admission HPI: Ms. Buist is a 33yo woman with PMHx of HTN, GERD, depression, and bipolar 1 disorder who presents to the ED with recurrent upper right back pain and right sided abdominal pain. Patient underwent a C-section on 04/19/15 and then  was seen in the ED on 04/28/15 for shortness of breath found to have a PE in the left lower lobe on CTA. She was discharged from the ED on Lovenox injections (1.5 mg/kg daily) as she wished to continue to breast feed her baby. She reports compliance with her Lovenox injections. She states she started to have sharp right upper back/right upper abdominal pain this morning and was worried because the pain felt similar to when she was diagnosed with PE over a week ago. She notes worsening pain when taking a deep breath and that the pain improves when she bends forward. She denies any shortness of breath. She also notes nausea and vomiting x 1. She reports pain in her right leg, but denies any swelling.   In the ED she had a repeat CTA which showed a new subsegmental PE in the right lung.   Hospital Course by problem list:  Pulmonary Embolism: Patient presented  with a 1 day hx of right upper back and right-sided abdominal pain in the setting of recent pregnancy/C-section and left PE diagnosed over 1 week ago while on Lovenox 1.5 mg/kg daily. She was found to have another provoked PE on the right side on CTA chest. After reviewing the CTA with the radiologist it is likely that the right sided PE was missed on the original CTA as it was extremely small in size. She had presented with right sided symptoms on both presentations. She was started on warfarin 5 mg daily and continued on daily Lovenox injections (155 mg daily) as she wishes to continue breast feeding. She will have follow up with the Internal Medicine clinic on 05/14/15 to check her INR. She will need follow up with the IM coumadin clinic until [facility]   Cholelithiasis without Cholecystitis: Incidentally found on abdominal US on admission. Patient without symptoms. Her right sided abdominal pain is most likely related to her acute PE.   Hypokalemia: K 3.3 on admission. This was repleted with K-Dur. She will need a repeat bmet at her outpatient follow up visit.   HTN: BP remained stable. She was discharged on her home Amlodipine 10 mg QHS.   Post-op Anemia Due to Acute Blood Loss: Hbg 10.3 on admission. She reports she was told by her OBGYN that she lost about 2 units of blood during her C-section on 04/19/15. Baseline Hbg appears to be around 11. Her anemia is most likely related to her recent acute blood loss. She is on iron supplementation at home. She will need a repeat CBC at her outpatient follow up visit.   Discharge Vitals:   BP 115/73 mmHg  Pulse 79  Temp(Src) 98.7 F (37.1 C) (Oral)  Resp 16  Ht 6' (1.829 m)  Wt 220 lb 3.8 oz (99.9 kg)  BMI 29.86 kg/m2  SpO2 97%  LMP   Breastfeeding? Yes Physical Exam General: young woman sitting up in bed, NAD HEENT: [place]/AT, EOMI, sclera anicteric, mucus membranes moist CV: RRR, no  m/g/r Pulm: CTA bilaterally, breaths non-labored Abd: BS+, soft, non-tender Ext: warm, no edema, mild tenderness to palpation of right calf Neuro: alert and oriented x 3, no focal deficits  Discharge Labs:  Results for orders placed or performed during the hospital encounter of 05/10/15 (from the past 24 hour(s))  Comprehensive metabolic panel     Status: Abnormal   Collection Time: 05/10/15 12:50 PM  Result Value Ref Range   Sodium 141 135 - 145  mmol/L   Potassium 3.3 (L) 3.5 - 5.1 mmol/L   Chloride 107 101 - 111 mmol/L   CO2 24 22 - 32 mmol/L   Glucose, Bld 89 65 - 99 mg/dL   BUN 7 6 - 20 mg/dL   Creatinine, Ser 7.84 0.44 - 1.00 mg/dL   Calcium 9.5 8.9 - 69.6 mg/dL   Total Protein 7.2 6.5 - 8.1 g/dL   Albumin 3.9 3.5 - 5.0 g/dL   AST 27 15 - 41 U/L   ALT 24 14 - 54 U/L   Alkaline Phosphatase 91 38 - 126 U/L   Total Bilirubin 0.6 0.3 - 1.2 mg/dL   GFR calc non Af Amer >60 >60 mL/min   GFR calc Af Amer >60 >60 mL/min   Anion gap 10 5 - 15  Lipase, blood     Status: None   Collection Time: 05/10/15 12:50 PM  Result Value Ref Range   Lipase 26 22 - 51 U/L  CBC with Differential     Status: Abnormal   Collection Time: 05/10/15 12:50 PM  Result Value Ref Range   WBC 6.3 4.0 - 10.5 K/uL   RBC 3.92 3.87 - 5.11 MIL/uL   Hemoglobin 10.3 (L) 12.0 - 15.0 g/dL   HCT 29.5 (L) 28.4 - 13.2 %   MCV 86.7 78.0 - 100.0 fL   MCH 26.3 26.0 - 34.0 pg   MCHC 30.3 30.0 - 36.0 g/dL   RDW 44.0 10.2 - 72.5 %   Platelets 450 (H) 150 - 400 K/uL   Neutrophils Relative % 76 43 - 77 %   Neutro Abs 4.8 1.7 - 7.7 K/uL   Lymphocytes Relative 16 12 - 46 %   Lymphs Abs 1.0 0.7 - 4.0 K/uL   Monocytes Relative 7 3 - 12 %   Monocytes Absolute 0.4 0.1 - 1.0 K/uL   Eosinophils Relative 1 0 - 5 %   Eosinophils Absolute 0.0 0.0 - 0.7 K/uL   Basophils Relative 0 0 - 1 %   Basophils Absolute 0.0 0.0 - 0.1 K/uL  Troponin I     Status: None   Collection Time: 05/10/15 12:50 PM  Result Value Ref Range    Troponin I <0.03 <0.031 ng/mL  Basic metabolic panel     Status: Abnormal   Collection Time: 05/11/15  8:30 AM  Result Value Ref Range   Sodium 141 135 - 145 mmol/L   Potassium 3.3 (L) 3.5 - 5.1 mmol/L   Chloride 108 101 - 111 mmol/L   CO2 23 22 - 32 mmol/L   Glucose, Bld 107 (H) 65 - 99 mg/dL   BUN <5 (L) 6 - 20 mg/dL   Creatinine, Ser 3.66 0.44 - 1.00 mg/dL   Calcium 9.0 8.9 - 44.0 mg/dL   GFR calc non Af Amer >60 >60 mL/min   GFR calc Af Amer >60 >60 mL/min   Anion gap 10 5 - 15    Signed: Su Hoff, MD 05/11/2015, 12:46 PM    Services Ordered on Discharge: None Equipment Ordered on Discharge: None

## 2015-05-11 NOTE — Progress Notes (Signed)
IV and tele monitor d/c at this time; pt to d/c home with family; pt given d/c instructions; prescriptions called into pharmacy; pt verbalized understanding of d/c instructions; will cont. To monitor.

## 2015-05-11 NOTE — Progress Notes (Signed)
Subjective: Patient reports her right back/chest/abdominal pain has improved. She denies any pleuritic chest pain or SOB.   Objective: Vital signs in last 24 hours: Filed Vitals:   05/10/15 1800 05/10/15 1915 05/10/15 2040 05/11/15 0500  BP: 121/76 109/73 131/89 115/73  Pulse: 91 85 86 79  Temp:   98.1 F (36.7 C) 98.7 F (37.1 C)  TempSrc:   Oral Oral  Resp: Height:   6' (1.829 m)   Weight:   220 lb 3.8 oz (99.9 kg)   SpO2: 100% 100% 100% 97%   Weight change:   Intake/Output Summary (Last 24 hours) at 05/11/15 1214 Last data filed at 05/11/15 0851  Gross per 24 hour  Intake    370 ml  Output      0 ml  Net    370 ml   Physical Exam General: young woman sitting up in bed, NAD HEENT: [place]/AT, EOMI, sclera anicteric, mucus membranes moist CV: RRR, no m/g/r Pulm: CTA bilaterally, breaths non-labored Abd: BS+, soft, non-tender Ext: warm, no edema, mild tenderness to palpation of right calf Neuro: alert and oriented x 3, no focal deficits  Lab Results: Basic Metabolic Panel:  Recent Labs Lab 05/10/15 1250 05/11/15 0830  NA 141 141  K 3.3* 3.3*  CL 107 108  CO2 24 23  GLUCOSE 89 107*  BUN 7 <5*  CREATININE 0.74 0.89  CALCIUM 9.5 9.0   Liver Function Tests:  Recent Labs Lab 05/10/15 1250  AST 27  ALT 24  ALKPHOS 91  BILITOT 0.6  PROT 7.2  ALBUMIN 3.9   CBC:  Recent Labs Lab 05/10/15 1250  WBC 6.3  NEUTROABS 4.8  HGB 10.3*  HCT 34.0*  MCV 86.7  PLT 450*   Cardiac Enzymes:  Recent Labs Lab 05/10/15 1250  TROPONINI <0.03   Studies/Results: Ct Angio Chest Pe W/cm &/or Wo Cm  05/11/2015   ADDENDUM REPORT: 05/11/2015 08:40  ADDENDUM: Imaging was reviewed with Dr. Cyndie Chime and the treating medical staff. Right lower lobe subsegmental pulmonary embolus is very peripheral and small measuring approximately 3 mm. Thrombus burden within the pulmonary vasculature is trace to very minimal.   Electronically Signed   By: Judie Petit.  Shick M.D.    On: 05/11/2015 08:40   05/11/2015   CLINICAL DATA:  Right-sided chest pain for 3 weeks after Cesarean section with shortness of breath, recently diagnosed with pulmonary embolism well on Lovenox ;STUDY WAS DISCUSSED WITH DR. Oleta Mouse PRIOR TO SCAN RE: QUESTIONALBLE ALLERGY, (PT. GIVEN BENADRYL PRIOR TO SCAN ON 5-17), NO PREMEDS REQUIRED FOR TODAY'S CT ANGIO CHEST,  EXAM: CT ANGIOGRAPHY CHEST WITH CONTRAST  TECHNIQUE: Multidetector CT imaging of the chest was performed using the standard protocol during bolus administration of intravenous contrast. Multiplanar CT image reconstructions and MIPs were obtained to evaluate the vascular anatomy.  CONTRAST:  OMNIPAQUE IOHEXOL 350 MG/ML SOLN  COMPARISON:  04/28/2015  FINDINGS: On the prior study there was subsegmental pulmonary embolus to the left lower lobe. That has resolved. However on the current study there is subsegmental embolus 2 and artery extending into the right lower lobe. This involves a peripheral branch involving the posterior medial right lung base. No other emboli are detected.  Thoracic aorta shows no dissection or dilatation. There is no pleural or pericardial effusion. There is mild bilateral dependent atelectasis with no evidence of abnormal pulmonary parenchymal opacification otherwise.  There is no significant hilar or mediastinal adenopathy. Thoracic inlet is normal. No acute  musculoskeletal findings. No acute abnormalities in the visualized portion of the upper abdomen.  Review of the MIP images confirms the above findings.  IMPRESSION: Although left pulmonary embolus has resolved, there is a new subsegmental pulmonary embolus involving the right lung.  Electronically Signed: By: Esperanza Heiraymond  Rubner M.D. On: 05/10/2015 15:09   [place] Abdomen Limited  05/10/2015   CLINICAL DATA:  Right upper quadrant pain since 7 a.m. this morning.  EXAM: US ABDOMEN LIMITED - RIGHT UPPER QUADRANT  COMPARISON:  None.  FINDINGS: Gallbladder:  Dependent, discoid shaped,  echogenic focus, which is likely a stone. No wall thickening. No pericholecystic fluid. No evidence of acute cholecystitis.  Common bile duct:  Diameter: 2 mm  Liver:  No focal lesion identified. Within normal limits in parenchymal echogenicity.  IMPRESSION: 1. Cholelithiasis without evidence of acute cholecystitis. 2. No other abnormality.   Electronically Signed   By: Amie Portlandavid  Ormond M.D.   On: 05/10/2015 13:16   Medications: I have reviewed the patient's current medications.  Assessment/Plan:  Pulmonary Embolism: Discussed CTA with radiologist and he believes the patient to have a very low clot burden. Likely that patient did not have a recurrent PE and that the right-sided PE was initially missed on the first CTA given both presentations she had right-sided chest pain/back pain. Her PE was provoked as she was recently pregnant and had a C-section 3 weeks ago.  - Discharge home - Discharge with coumadin 5 mg daily - Bridge with Lovenox 1.5 mg/kg daily at home - Follow up in IM clinic for INR check on Wed (6/1) or Thurs (6/2)  Cholelithiasis without Cholecystitis: Incidentally found on abdominal US. Patient without symptoms. Her right sided abdominal pain is most likely related to her acute PE.   Hypokalemia: K 3.3 on admission. K repleted.  - Will need repeat bmet at outpatient visit   HTN: BP stable. Patient takes Amlodipine 10 mg QHS at home.  - Discharge on home Amlodipine  Post-op Anemia Due to Acute Blood Loss: Hbg 10.3 on admission. She reports she was told by her OBGYN that she lost about 2 units of blood during her C-section on 04/19/15. Baseline Hbg appears to be around 11. Her anemia is most likely related to her recent acute blood loss. She is on iron supplementation at home.  - Continue to monitor. Will need repeat CBC at outpatient follow up visit.  Diet: Regular  VTE PPx: Coumadin bridged with Lovenox  Dispo: Discharge today with follow up in IM clinic   The patient does  have a current PCP Pennie Rushing(Olayinka D Akinola) and [facility].  The patient does not have transportation limitations that hinder transportation to clinic appointments.  .Services Needed at time of discharge: Y = Yes, Blank = No PT:   OT:   RN:   Equipment:   Other:       Su Hoffarly J Rivet, MD 05/11/2015, 12:14 PM

## 2015-05-13 ENCOUNTER — Ambulatory Visit (INDEPENDENT_AMBULATORY_CARE_PROVIDER_SITE_OTHER): Payer: Non-veteran care | Admitting: Internal Medicine

## 2015-05-13 ENCOUNTER — Ambulatory Visit: Payer: Non-veteran care | Admitting: Pharmacist

## 2015-05-13 VITALS — BP 104/77 | HR 100 | Temp 100.0°F | Ht 72.0 in | Wt 220.0 lb

## 2015-05-13 DIAGNOSIS — I2699 Other pulmonary embolism without acute cor pulmonale: Secondary | ICD-10-CM

## 2015-05-13 DIAGNOSIS — E876 Hypokalemia: Secondary | ICD-10-CM

## 2015-05-13 DIAGNOSIS — D62 Acute posthemorrhagic anemia: Secondary | ICD-10-CM | POA: Diagnosis not present

## 2015-05-13 LAB — BASIC METABOLIC PANEL WITH GFR
BUN: 7 mg/dL (ref 6–23)
CALCIUM: 9.2 mg/dL (ref 8.4–10.5)
CO2: 25 mEq/L (ref 19–32)
Chloride: 109 mEq/L (ref 96–112)
Creat: 0.75 mg/dL (ref 0.50–1.10)
GFR, Est African American: >89 mL/min
GFR, Est Non African American: >89 mL/min
Glucose, Bld: 73 mg/dL (ref 70–99)
POTASSIUM: 4.2 meq/L (ref 3.5–5.3)
SODIUM: 144 meq/L (ref 135–145)

## 2015-05-13 LAB — CBC
HCT: 31.2 % — ABNORMAL LOW (ref 36.0–46.0)
Hemoglobin: 9.8 g/dL — ABNORMAL LOW (ref 12.0–15.0)
MCH: 26.3 pg (ref 26.0–34.0)
MCHC: 31.4 g/dL (ref 30.0–36.0)
MCV: 83.6 fL (ref 78.0–100.0)
MPV: 10.2 fL (ref 8.6–12.4)
Platelets: 395 10*3/uL (ref 150–400)
RBC: 3.73 MIL/uL — ABNORMAL LOW (ref 3.87–5.11)
RDW: 15.7 % — ABNORMAL HIGH (ref 11.5–15.5)
WBC: 5.6 10*3/uL (ref 4.0–10.5)

## 2015-05-13 LAB — POCT INR: INR: 1.2

## 2015-05-13 MED ORDER — WARFARIN SODIUM 5 MG PO TABS: 7.5000 mg | ORAL_TABLET | Freq: Every day | ORAL | Status: DC

## 2015-05-13 NOTE — Patient Instructions (Addendum)
General Instructions:  1. Schedule follow up appointment for Friday.   2. Please take all medications as previously prescribed with the following changes:  Continue Lovenox. Increase Coumadin to 7.5 mg daily as discussed.   DO NOT TAKE AMLODIPINE UNTIL YOU FOLLOW UP WITH YOUR PCP.   3. If you have worsening of your symptoms or new symptoms arise, please call the clinic [phone]), or go to the ER immediately if symptoms are severe.  You have done a great job in taking all your medications. Please continue to do this.  Please bring your medicines with you each time you come to clinic.  Medicines may include prescription medications, over-the-counter medications, herbal remedies, eye drops, vitamins, or other pills.   Progress Toward Treatment Goals:  Treatment Goal 05/13/2015  Blood pressure at goal    Self Care Goals & Plans:  Self Care Goal 05/13/2015  Manage my medications take my medicines as prescribed; bring my medications to every visit  Meeting treatment goals maintain the current self-care plan    No flowsheet data found.   Care Management & Community Referrals:  Referral 05/13/2015  Referrals made for care management support none needed  Referrals made to community resources none

## 2015-05-13 NOTE — Progress Notes (Signed)
Anti-Coagulation Progress Note  Danielle Estes is a 34 y.o. female who is currently on an anti-coagulation regimen.    RECENT RESULTS: Recent results are below, the most recent result is correlated with a dose of5 mg X 3 days: Lab Results  Component Value Date   INR 1.2 05/13/2015    ANTI-COAG DOSE: Anticoagulation Dose Instructions as of 05/13/2015      Glynis SmilesSun Mon Tue Wed Thu Fri Sat   New Dose 7.5 mg 7.5 mg 7.5 mg 7.5 mg 7.5 mg 7.5 mg 7.5 mg    Description        Take 1.5 tablets (7.5 mg) daily X 2 days and then follow-up with INR       ANTICOAG SUMMARY: Anticoagulation Episode Summary    Current INR goal 2.0-3.0   Next INR check 05/15/2015   INR from last check 1.2! (05/13/2015)   Weekly max dose    Target end date    INR check location    Preferred lab    Send INR reminders to       Comments         ANTICOAG TODAY: Anticoagulation Summary as of 05/13/2015    INR goal 2.0-3.0   Selected INR 1.2! (05/13/2015)   Next INR check 05/15/2015   Target end date     Anticoagulation Episode Summary    INR check location    Preferred lab    Send INR reminders to    Comments       PATIENT INSTRUCTIONS: Patient Instructions  Patient instructed to take medications as defined in the Anti-coagulation Track section of this encounter.  Patient instructed to take today's dose.  Patient verbalized understanding of these instructions.      FOLLOW-UP Return in about 3 days (around 05/16/2015) for F/U INR on 05/16/15 @ 1100.   Russ HaloAshley Attallah Ontko, PharmD Clinical Pharmacist - Resident Pager: [phone] 6/1/20163:21 PM

## 2015-05-13 NOTE — Patient Instructions (Signed)
Patient instructed to take medications as defined in the Anti-coagulation Track section of this encounter.  Patient instructed to take today's dose.  Patient verbalized understanding of these instructions.    

## 2015-05-13 NOTE — Progress Notes (Signed)
   Subjective:   Patient ID: Danielle Estes female   DOB: 06-23-1981 34 y.o.   MRN: [id number]  HPI: Danielle Estes is a 34 y.o. female w/ PMHx of HTN, anemia, GERD, PE, Bipolar Disorder, and Depression, presents to the clinic today for a hospital follow up appointment after admission for PE x2. Patient was started on Lovenox bridge and Coumadin. Today her INR is 1.2. She is doing well as far as symptoms are concerned; she denies SOB, chest pain, palpitations, lightheadedness, LE swelling, fever, chills, or cough. She denies bleeding, fatigue, or somnolence. She has been compliant w/ her Coumadin and Lovenox, according to the patient.   Past Medical History  Diagnosis Date  . Headache   . Hypertension   . Multiple allergies   . Vaginal Pap smear, abnormal   . Anemia   . GERD (gastroesophageal reflux disease)   . Depression   . Bipolar 1 disorder   . Pulmonary embolism    Current Outpatient Prescriptions  Medication Sig Dispense Refill  . enoxaparin (LOVENOX) 100 MG/ML injection Inject 1.55 mLs (155 mg total) into the skin daily. 20 mL 0  . ferrous sulfate 325 (65 FE) MG tablet Take 325 mg by mouth 3 (three) times daily with meals.     . triamcinolone ointment (KENALOG) 0.5 % Apply 1 application topically 2 (two) times daily. For eczema    . warfarin (COUMADIN) 5 MG tablet Take 1.5 tablets (7.5 mg total) by mouth daily at 6 PM. 30 tablet 1   No current facility-administered medications for this visit.   Review of Systems  General: Denies fever, diaphoresis, appetite change, and fatigue.  Respiratory: Denies SOB, cough, and wheezing.   Cardiovascular: Denies chest pain and palpitations.  Gastrointestinal: Denies nausea, vomiting, abdominal pain, and diarrhea Musculoskeletal: Denies myalgias, arthralgias, back pain, and gait problem.  Neurological: Denies dizziness, syncope, weakness, lightheadedness, and headaches.  Psychiatric/Behavioral: Denies mood changes, sleep  disturbance, and agitation.   Objective:   Physical Exam: Filed Vitals:   05/13/15 1524 05/13/15 1646  BP: 104/77   Pulse: 100   Temp: 100 F (37.8 C)   TempSrc: Oral   Height: 6' (1.829 m)   Weight:  220 lb (99.791 kg)  SpO2: 100%     General: AA female, alert, cooperative, NAD. HEENT: PERRL, EOMI. Moist mucus membranes Neck: Full range of motion without pain, supple, no lymphadenopathy or carotid bruits Lungs: Clear to ascultation bilaterally, normal work of respiration, no wheezes, rales, rhonchi Heart: RRR, no murmurs, gallops, or rubs Abdomen: Soft, non-tender, non-distended, BS + Extremities: No cyanosis, clubbing, or edema Neurologic: Alert & oriented X3, cranial nerves II-XII intact, strength grossly intact, sensation intact to light touch   Assessment & Plan:   Please see problem based assessment and plan.

## 2015-05-14 NOTE — Assessment & Plan Note (Signed)
Checked BMP, K 4.2

## 2015-05-14 NOTE — Assessment & Plan Note (Signed)
Recent admission for PE s/p cesarean delivery. Discharged w/ Coumadin + Lovenox bridge. Today, INR is 1.2. No SOB, palpitations, chest pain, dizziness, lightheadedness, or cough.  -Continue Lovenox -Increase Coumadin to 7.5 mg daily -Patient to return to Coumadin clinic on Friday for INR recheck. -Instructed patient to schedule follow up appointment w/ PCP for further management of Coumadin once therapeutic.

## 2015-05-14 NOTE — Assessment & Plan Note (Signed)
No signs of bleeding per patient. CBC checked today, Hb 9.8, only slightly decreased from discharge at 10.3. Vitals stable.

## 2015-05-15 ENCOUNTER — Ambulatory Visit (INDEPENDENT_AMBULATORY_CARE_PROVIDER_SITE_OTHER): Payer: Non-veteran care | Admitting: Pharmacist

## 2015-05-15 DIAGNOSIS — I2699 Other pulmonary embolism without acute cor pulmonale: Secondary | ICD-10-CM | POA: Diagnosis not present

## 2015-05-15 DIAGNOSIS — Z7901 Long term (current) use of anticoagulants: Secondary | ICD-10-CM

## 2015-05-15 LAB — POCT INR: INR: 1.7

## 2015-05-15 NOTE — Progress Notes (Signed)
Internal Medicine Clinic Attending  Case discussed with Dr. Jones soon after the resident saw the patient.  We reviewed the resident's history and exam and pertinent patient test results.  I agree with the assessment, diagnosis, and plan of care documented in the resident's note. 

## 2015-05-15 NOTE — Patient Instructions (Signed)
Patient educated about medication as defined in this encounter and verbalized understanding by repeating back instructions provided.   

## 2015-05-15 NOTE — Progress Notes (Signed)
CLINICAL PHARMACIST ANTICOAG NOTE Danielle Estes is a 34 y.o. female who reports to the clinic for monitoring of warfarin treatment.    Indication: PE Duration: indefinite  Anticoagulation Clinic Visit History: Anticoagulation Episode Summary    Current INR goal 2.0-3.0   Next INR check 05/18/2015   INR from last check 1.7! (05/15/2015)   Weekly max dose    Target end date    INR check location    Preferred lab    Send INR reminders to       Comments        ASSESSMENT Recent Results: Recent results are below, the most recent result is correlated with 5 mg x 2 days, then 7.5 mg x 2 days Lab Results  Component Value Date   INR 1.7 05/15/2015   INR 1.2 05/13/2015    INR today: Subtherapeutic  PLAN Anticoagulation Dosing: 7.5 mg x 3 days (continue enoxaparin as prescribed until follow-up appointment for further instructions)  Patient Instructions  Patient educated about medication as defined in this encounter and verbalized understanding by repeating back instructions provided.    Follow-up Return in about 3 days (around 05/18/2015) for Follow up INR 05/18/15 at 8:30 am.  Marzetta BoardJennifer Laurelai Lepp Clinical Pharmacist  15 minutes spent face-to-face with the patient during the encounter. 50% of time spent on education. 50% of time was spent on assessment and plan.

## 2015-05-18 ENCOUNTER — Ambulatory Visit (INDEPENDENT_AMBULATORY_CARE_PROVIDER_SITE_OTHER): Payer: Non-veteran care | Admitting: Pharmacist

## 2015-05-18 DIAGNOSIS — I2699 Other pulmonary embolism without acute cor pulmonale: Secondary | ICD-10-CM

## 2015-05-18 DIAGNOSIS — Z7901 Long term (current) use of anticoagulants: Secondary | ICD-10-CM

## 2015-05-18 LAB — POCT INR: INR: 2

## 2015-05-18 NOTE — Progress Notes (Signed)
Patient was seen in clinic with Danielle Estes, PharmD candidate. I agree with the assessment and plan of care documented.

## 2015-05-18 NOTE — Progress Notes (Signed)
CLINICAL PHARMACIST ANTICOAG NOTE Danielle Estes is a 34 y.o. female who reports to the clinic for monitoring of warfarin treatment.    Indication: PE Duration: indefinite  Anticoagulation Clinic Visit History: Anticoagulation Episode Summary    Current INR goal 2.0-3.0   Next INR check 05/22/2015   INR from last check 2.0 (05/18/2015)   Weekly max dose    Target end date    INR check location    Preferred lab    Send INR reminders to       Comments        ASSESSMENT Recent Results: Recent results are below, the most recent result is correlated with a dose of 47.5 mg per week: Lab Results  Component Value Date   INR 2.0 05/18/2015   INR 1.7 05/15/2015   INR 1.2 05/13/2015    INR today: Therapeutic  Anticoagulation Dosing: INR as of 05/18/2015 and Previous Dosing Information    INR Dt INR Goal Wkly Tot Sun Mon Tue Wed Thu Fri Sat   05/18/2015 2.0 2.0-3.0 52.5 mg 7.5 mg 7.5 mg 7.5 mg 7.5 mg 7.5 mg 7.5 mg 7.5 mg    Previous description        Take 1.5 tablets (7.5 mg) daily X 3 days and then follow-up with INR. Continue bridging [facility]    Anticoagulation Dose Instructions as of 05/18/2015      Total Sun Mon Tue Wed Thu Fri Sat   New Dose 47.5 mg 7.5 mg 7.5 mg 5 mg 7.5 mg 5 mg 7.5 mg 7.5 mg     (5 mg x 1.5)  (5 mg x 1.5)  (5 mg x 1)  (5 mg x 1.5)  (5 mg x 1)  (5 mg x 1.5)  (5 mg x 1.5)                         Description        Follow up in 4 days      PLAN Weekly dose was unchanged to 47.5 mg per week  Patient Instructions  Patient educated about medication as defined in this encounter and verbalized understanding by repeating back instructions provided.     Follow-up Return in about 4 days (around 05/22/2015) for Follow up INR 05/22/15 at 10 am.  Marzetta BoardJennifer Kim Clinical Pharmacist  30 minutes spent face-to-face with the patient during the encounter. 50% of time spent on education. 50% of time  was spent on testing.

## 2015-05-18 NOTE — Patient Instructions (Signed)
Patient educated about medication as defined in this encounter and verbalized understanding by repeating back instructions provided.   

## 2015-05-21 ENCOUNTER — Telehealth: Payer: Self-pay | Admitting: Pharmacist

## 2015-05-21 NOTE — Telephone Encounter (Signed)
Call to patient to confirm appointment for 05/22/15 at 10:30 phones dont accept calls

## 2015-05-22 ENCOUNTER — Ambulatory Visit: Payer: Non-veteran care | Admitting: Obstetrics & Gynecology

## 2015-05-22 ENCOUNTER — Other Ambulatory Visit: Payer: Self-pay | Admitting: Pharmacist

## 2015-05-22 ENCOUNTER — Ambulatory Visit (INDEPENDENT_AMBULATORY_CARE_PROVIDER_SITE_OTHER): Payer: Non-veteran care | Admitting: Pharmacist

## 2015-05-22 DIAGNOSIS — I2699 Other pulmonary embolism without acute cor pulmonale: Secondary | ICD-10-CM

## 2015-05-22 DIAGNOSIS — Z7901 Long term (current) use of anticoagulants: Secondary | ICD-10-CM

## 2015-05-22 LAB — POCT INR: INR: 1.9

## 2015-05-22 MED ORDER — WARFARIN SODIUM 5 MG PO TABS: 7.5000 mg | ORAL_TABLET | Freq: Every day | ORAL | Status: DC

## 2015-05-22 NOTE — Addendum Note (Signed)
Addended by: Natividad Brood on: 05/22/2015 10:27 AM   Modules accepted: Orders

## 2015-05-22 NOTE — Patient Instructions (Signed)
Patient instructed to take medications as defined in the Anti-coagulation Track section of this encounter.  Patient instructed to take today's dose.  Patient verbalized understanding of these instructions.    

## 2015-05-22 NOTE — Progress Notes (Signed)
Anti-Coagulation Progress Note  Danielle Estes is a 34 y.o. female who is currently on an anti-coagulation regimen.    RECENT RESULTS: Recent results are below, the most recent result is correlated with a dose of 47.5 mg. per week: Lab Results  Component Value Date   INR 1.9 05/22/2015   INR 2.0 05/18/2015   INR 1.7 05/15/2015    ANTI-COAG DOSE: Anticoagulation Dose Instructions as of 05/22/2015      Glynis Smiles Tue Wed Thu Fri Sat   New Dose 7.5 mg 7.5 mg 7.5 mg 7.5 mg 7.5 mg 7.5 mg 7.5 mg    Description        Follow-up in 1 week on 05/29/15 @ 1000       ANTICOAG SUMMARY: Anticoagulation Episode Summary    Current INR goal 2.0-3.0   Next INR check 05/29/2015   INR from last check 1.9! (05/22/2015)   Weekly max dose    Target end date    INR check location    Preferred lab    Send INR reminders to       Comments         ANTICOAG TODAY: Anticoagulation Summary as of 05/22/2015    INR goal 2.0-3.0   Selected INR 1.9! (05/22/2015)   Next INR check 05/29/2015   Target end date     Anticoagulation Episode Summary    INR check location    Preferred lab    Send INR reminders to    Comments       PATIENT INSTRUCTIONS: Patient Instructions  Patient instructed to take medications as defined in the Anti-coagulation Track section of this encounter.  Patient instructed to take today's dose.  Patient verbalized understanding of these instructions.       FOLLOW-UP Return in 7 days (on 05/29/2015) for Follow-up INR @ 1000.   Russ Halo, PharmD Clinical Pharmacist - Resident Pager: [phone] 6/10/201610:08 AM

## 2015-06-01 ENCOUNTER — Telehealth: Payer: Self-pay | Admitting: Pharmacist

## 2015-06-01 ENCOUNTER — Ambulatory Visit (INDEPENDENT_AMBULATORY_CARE_PROVIDER_SITE_OTHER): Payer: Non-veteran care | Admitting: Pharmacist

## 2015-06-01 DIAGNOSIS — Z7901 Long term (current) use of anticoagulants: Secondary | ICD-10-CM

## 2015-06-01 DIAGNOSIS — I2699 Other pulmonary embolism without acute cor pulmonale: Secondary | ICD-10-CM | POA: Diagnosis not present

## 2015-06-01 LAB — POCT INR: INR: 2.5

## 2015-06-01 MED ORDER — WARFARIN SODIUM 5 MG PO TABS: 7.5000 mg | ORAL_TABLET | Freq: Every day | ORAL | Status: DC

## 2015-06-01 NOTE — Progress Notes (Signed)
Anti-Coagulation Progress Note  Danielle Estes is a 34 y.o. female who is currently on an anti-coagulation regimen.    RECENT RESULTS: Recent results are below, the most recent result is correlated with a dose of 52.5 mg. per week: Lab Results  Component Value Date   INR 2.5 06/01/2015   INR 1.9 05/22/2015   INR 2.0 05/18/2015    ANTI-COAG DOSE: Anticoagulation Dose Instructions as of 06/01/2015      Glynis Smiles Tue Wed Thu Fri Sat   New Dose 7.5 mg 7.5 mg 7.5 mg 7.5 mg 7.5 mg 7.5 mg 7.5 mg    Description        Follow-up in 1 week on 05/29/15 @ 1000       ANTICOAG SUMMARY: Anticoagulation Episode Summary    Current INR goal 2.0-3.0   Next INR check 06/29/2015   INR from last check 2.5 (06/01/2015)   Weekly max dose    Target end date    INR check location    Preferred lab    Send INR reminders to       Comments         ANTICOAG TODAY: Anticoagulation Summary as of 06/01/2015    INR goal 2.0-3.0   Selected INR 2.5 (06/01/2015)   Next INR check 06/29/2015   Target end date     Anticoagulation Episode Summary    INR check location    Preferred lab    Send INR reminders to    Comments       PATIENT INSTRUCTIONS: Patient Instructions  Patient instructed to take medications as defined in the Anti-coagulation Track section of this encounter.  Patient instructed to take today's dose.  Patient verbalized understanding of these instructions.        FOLLOW-UP Return in 4 weeks (on 06/29/2015) for Follow-up INR @ 0900.   Russ Halo, PharmD Clinical Pharmacist - Resident Pager: [phone] 6/20/20161:48 PM

## 2015-06-01 NOTE — Telephone Encounter (Signed)
Patient seen at [facility] on 05/22/15.  Patient was asked to come back for a follow-up INR on 05/29/15.  After the patient left, it was realized that the clinic would be closed on 6/17.  Attempts were made to contact the patient at the documented phone numbers, but the calls were not able to be completed.  She has not set up her My Chart Account, so I was unable to use that method to contact her as well.  I came into clinic on Friday, 6/17 and waited for the patient to arrive.  I waited 1.5 hours and patient did not come into clinic.    If patient calls the clinic or comes in for follow-up INR, we will need to update her contact information.  Thanks,  Russ Halo, PharmD Clinical Pharmacist - Resident Pager: [phone] 6/20/20168:51 AM

## 2015-06-01 NOTE — Patient Instructions (Signed)
Patient instructed to take medications as defined in the Anti-coagulation Track section of this encounter.  Patient instructed to take today's dose.  Patient verbalized understanding of these instructions.    

## 2015-06-04 NOTE — Progress Notes (Signed)
Patient is on anticoagulation for pulmonary embolism.  I have reviewed Pharmacy note.  INR at goal.

## 2015-06-09 ENCOUNTER — Encounter: Payer: Self-pay | Admitting: General Practice

## 2015-06-22 ENCOUNTER — Telehealth: Payer: Self-pay | Admitting: Pharmacist

## 2015-06-22 DIAGNOSIS — I2699 Other pulmonary embolism without acute cor pulmonale: Secondary | ICD-10-CM

## 2015-06-22 MED ORDER — WARFARIN SODIUM 5 MG PO TABS: 7.5000 mg | ORAL_TABLET | Freq: Every day | ORAL | Status: DC

## 2015-06-22 NOTE — Telephone Encounter (Signed)
[facility] but states that [facility].

## 2015-06-24 ENCOUNTER — Other Ambulatory Visit: Payer: Self-pay | Admitting: Internal Medicine

## 2015-06-24 ENCOUNTER — Ambulatory Visit (INDEPENDENT_AMBULATORY_CARE_PROVIDER_SITE_OTHER): Payer: Non-veteran care | Admitting: Family Medicine

## 2015-06-24 ENCOUNTER — Encounter: Payer: Self-pay | Admitting: Family Medicine

## 2015-06-24 DIAGNOSIS — I2699 Other pulmonary embolism without acute cor pulmonale: Secondary | ICD-10-CM

## 2015-06-24 MED ORDER — ENALAPRIL MALEATE 20 MG PO TABS: 20.0000 mg | ORAL_TABLET | Freq: Every day | ORAL | Status: DC

## 2015-06-24 NOTE — Patient Instructions (Signed)
Hypertension Hypertension, commonly called high blood pressure, is when the force of blood pumping through your arteries is too strong. Your arteries are the blood vessels that carry blood from your heart throughout your body. A blood pressure reading consists of a higher number over a lower number, such as 110/72. The higher number (systolic) is the pressure inside your arteries when your heart pumps. The lower number (diastolic) is the pressure inside your arteries when your heart relaxes. Ideally you want your blood pressure below 120/80. Hypertension forces your heart to work harder to pump blood. Your arteries may become narrow or stiff. Having hypertension puts you at risk for heart disease, stroke, and other problems.  RISK FACTORS Some risk factors for high blood pressure are controllable. Others are not.  Risk factors you cannot control include:   Race. You may be at higher risk if you are African American.  Age. Risk increases with age.  Gender. Men are at higher risk than women before age 45 years. After age 65, women are at higher risk than men. Risk factors you can control include:  Not getting enough exercise or physical activity.  Being overweight.  Getting too much fat, sugar, calories, or salt in your diet.  Drinking too much alcohol. SIGNS AND SYMPTOMS Hypertension does not usually cause signs or symptoms. Extremely high blood pressure (hypertensive crisis) may cause headache, anxiety, shortness of breath, and nosebleed. DIAGNOSIS  To check if you have hypertension, your health care provider will measure your blood pressure while you are seated, with your arm held at the level of your heart. It should be measured at least twice using the same arm. Certain conditions can cause a difference in blood pressure between your right and left arms. A blood pressure reading that is higher than normal on one occasion does not mean that you need treatment. If one blood pressure reading  is high, ask your health care provider about having it checked again. TREATMENT  Treating high blood pressure includes making lifestyle changes and possibly taking medicine. Living a healthy lifestyle can help lower high blood pressure. You may need to change some of your habits. Lifestyle changes may include:  Following the DASH diet. This diet is high in fruits, vegetables, and whole grains. It is low in salt, red meat, and added sugars.  Getting at least 2 hours of brisk physical activity every week.  Losing weight if necessary.  Not smoking.  Limiting alcoholic beverages.  Learning ways to reduce stress. If lifestyle changes are not enough to get your blood pressure under control, your health care provider may prescribe medicine. You may need to take more than one. Work closely with your health care provider to understand the risks and benefits. HOME CARE INSTRUCTIONS  Have your blood pressure rechecked as directed by your health care provider.   Take medicines only as directed by your health care provider. Follow the directions carefully. Blood pressure medicines must be taken as prescribed. The medicine does not work as well when you skip doses. Skipping doses also puts you at risk for problems.   Do not smoke.   Monitor your blood pressure at home as directed by your health care provider. SEEK MEDICAL CARE IF:   You think you are having a reaction to medicines taken.  You have recurrent headaches or feel dizzy.  You have swelling in your ankles.  You have trouble with your vision. SEEK IMMEDIATE MEDICAL CARE IF:  You develop a severe headache or confusion.    You have unusual weakness, numbness, or feel faint.  You have severe chest or abdominal pain.  You vomit repeatedly.  You have trouble breathing. MAKE SURE YOU:   Understand these instructions.  Will watch your condition.  Will get help right away if you are not doing well or get worse. Document  Released: 11/28/2005 Document Revised: 04/14/2014 Document Reviewed: 09/20/2013 [facility] Patient Information 2015 [place], [place]. This information is not intended to replace advice given to you by your health care provider. Make sure you discuss any questions you have with your health care provider.   Postpartum Depression and Baby Blues The postpartum period begins right after the birth of a baby. During this time, there is often a great amount of joy and excitement. It is also a time of many changes in the life of the parents. Regardless of how many times a mother gives birth, each child brings new challenges and dynamics to the family. It is not unusual to have feelings of excitement along with confusing shifts in moods, emotions, and thoughts. All mothers are at risk of developing postpartum depression or the "baby blues." These mood changes can occur right after giving birth, or they may occur many months after giving birth. The baby blues or postpartum depression can be mild or severe. Additionally, postpartum depression can go away rather quickly, or it can be a long-term condition.  CAUSES Raised hormone levels and the rapid drop in those levels are thought to be a main cause of postpartum depression and the baby blues. A number of hormones change during and after pregnancy. Estrogen and progesterone usually decrease right after the delivery of your baby. The levels of thyroid hormone and various cortisol steroids also rapidly drop. Other factors that play a role in these mood changes include major life events and genetics.  RISK FACTORS If you have any of the following risks for the baby blues or postpartum depression, know what symptoms to watch out for during the postpartum period. Risk factors that may increase the likelihood of getting the baby blues or postpartum depression include:  Having a personal or family history of depression.   Having depression while being pregnant.   Having  premenstrual mood issues or mood issues related to oral contraceptives.  Having a lot of life stress.   Having marital conflict.   Lacking a social support network.   Having a baby with special needs.   Having health problems, such as diabetes.  SIGNS AND SYMPTOMS Symptoms of baby blues include:  Brief changes in mood, such as going from extreme happiness to sadness.  Decreased concentration.   Difficulty sleeping.   Crying spells, tearfulness.   Irritability.   Anxiety.  Symptoms of postpartum depression typically begin within the first month after giving birth. These symptoms include:  Difficulty sleeping or excessive sleepiness.   Marked weight loss.   Agitation.   Feelings of worthlessness.   Lack of interest in activity or food.  Postpartum psychosis is a very serious condition and can be dangerous. Fortunately, it is rare. Displaying any of the following symptoms is cause for immediate medical attention. Symptoms of postpartum psychosis include:   Hallucinations and delusions.   Bizarre or disorganized behavior.   Confusion or disorientation.  DIAGNOSIS  A diagnosis is made by an evaluation of your symptoms. There are no medical or lab tests that lead to a diagnosis, but there are various questionnaires that a health care provider may use to identify those with the baby blues,  postpartum depression, or psychosis. Often, a screening tool called the [place] Postnatal Depression Scale is used to diagnose depression in the postpartum period.  TREATMENT The baby blues usually goes away on its own in 1-2 weeks. Social support is often all that is needed. You will be encouraged to get adequate sleep and rest. Occasionally, you may be given medicines to help you sleep.  Postpartum depression requires treatment because it can last several months or longer if it is not treated. Treatment may include individual or group therapy, medicine, or both to  address any social, physiological, and psychological factors that may play a role in the depression. Regular exercise, a healthy diet, rest, and social support may also be strongly recommended.  Postpartum psychosis is more serious and needs treatment right away. Hospitalization is often needed. HOME CARE INSTRUCTIONS  Get as much rest as you can. Nap when the baby sleeps.   Exercise regularly. Some women find yoga and walking to be beneficial.   Eat a balanced and nourishing diet.   Do little things that you enjoy. Have a cup of tea, take a bubble bath, read your favorite magazine, or listen to your favorite music.  Avoid alcohol.   Ask for help with household chores, cooking, grocery shopping, or running errands as needed. Do not try to do everything.   Talk to people close to you about how you are feeling. Get support from your partner, family members, friends, or other new moms.  Try to stay positive in how you think. Think about the things you are grateful for.   Do not spend a lot of time alone.   Only take over-the-counter or prescription medicine as directed by your health care provider.  Keep all your postpartum appointments.   Let your health care provider know if you have any concerns.  SEEK MEDICAL CARE IF: You are having a reaction to or problems with your medicine. SEEK IMMEDIATE MEDICAL CARE IF:  You have suicidal feelings.   You think you may harm the baby or someone else. MAKE SURE YOU:  Understand these instructions.  Will watch your condition.  Will get help right away if you are not doing well or get worse. Document Released: 09/01/2004 Document Revised: 12/03/2013 Document Reviewed: 09/09/2013 [facility] Patient Information 2015 [place], [place]. This information is not intended to replace advice given to you by your health care provider. Make sure you discuss any questions you have with your health care provider.

## 2015-06-24 NOTE — Progress Notes (Signed)
Subjective:   Danielle Estes is a 34 y.o. female who presents for a postpartum visit. She is 6 weeks postpartum following a low cervical transverse Cesarean section. I have fully reviewed the prenatal and intrapartum course. The delivery was at 39 gestational weeks. Outcome: primary cesarean section, low transverse incision and BTL. Anesthesia: spinal. Postpartum course has been normal. Baby's course has been normal. Baby is feeding by breast and bottle. Bleeding no bleeding. Bowel function is normal. Bladder function is normal. Patient is sexually active. Contraception method is tubal ligation. Postpartum depression screening: negative.  The following portions of the patient's history were reviewed and updated as appropriate: allergies, current medications, past family history, past medical history, past social history, past surgical history and problem list.  Review of Systems  Pertinent items are noted in HPI.  Objective:   BP 165/90 mmHg  Pulse 90  Wt 219 lb 6.4 oz (99.519 kg)  Breastfeeding? Yes  General:  alert, cooperative and no distress   Abdomen:  soft, non-tender; bowel sounds normal; no masses, no organomegaly. Incision clean, dry, intact    Assessment:   1. Postpartum examination following cesarean delivery  2. Pulmonary embolism on right  3. HTN   Plan:   1. Contraception: IUD  2. Start enalapril (safe in breastfeeding). Pt referred to primary doctor for continued care of this.  3. Continue Coumadin  4. Follow up in: 1 year or as needed.

## 2015-06-24 NOTE — Progress Notes (Deleted)
  Subjective:     Danielle Estes is a 34 y.o. female who presents for a postpartum visit. She is {1-10:13787} {time; units:18646} postpartum following a {delivery:12449}. I have fully reviewed the prenatal and intrapartum course. The delivery was at *** gestational weeks. Outcome: {delivery outcome:32078}. Anesthesia: {anesthesia types:812}. Postpartum course has been ***. Baby's course has been ***. Baby is feeding by {breast/bottle:69}. Bleeding {vag bleed:12292}. Bowel function is {normal:32111}. Bladder function is {normal:32111}. Patient {is/is not:9024} sexually active. Contraception method is {contraceptive method:5051}. Postpartum depression screening: {neg default:13464::"negative"}.  {Common ambulatory SmartLinks:19316}  Review of Systems {ros; complete:30496}   Objective:    BP 165/90 mmHg  Pulse 90  Wt 219 lb 6.4 oz (99.519 kg)  Breastfeeding? Yes  General:  {gen appearance:16600}   Breasts:  {breast exam:1202::"inspection negative, no nipple discharge or bleeding, no masses or nodularity palpable"}  Lungs: {lung exam:16931}  Heart:  {heart exam:5510}  Abdomen: {abdomen exam:16834}   Vulva:  {labia exam:12198}  Vagina: {vagina exam:12200}  Cervix:  {cervix exam:14595}  Corpus: {uterus exam:12215}  Adnexa:  {adnexa exam:12223}  Rectal Exam: {rectal/vaginal exam:12274}        Assessment:    *** postpartum exam. Pap smear {done:10129} at today's visit.   Plan:    1. Contraception: {method:5051} 2. *** 3. Follow up in: {1-10:13787} {time; units:19136} or as needed.  Subjective:     Danielle Estes is a 34 y.o. female who presents for a postpartum visit. She is 6 weeks postpartum following a low cervical transverse Cesarean section. I have fully reviewed the prenatal and intrapartum course. The delivery was at 39 gestational weeks. Outcome: primary cesarean section, low transverse incision and BTL. Anesthesia: spinal. Postpartum course has been normal. Baby's course  has been normal. Baby is feeding by breast and bottle. Bleeding no bleeding. Bowel function is normal. Bladder function is normal. Patient is sexually active. Contraception method is tubal ligation. Postpartum depression screening: negative.  The following portions of the patient's history were reviewed and updated as appropriate: allergies, current medications, past family history, past medical history, past social history, past surgical history and problem list.  Review of Systems Pertinent items are noted in HPI.   Objective:    BP 165/90 mmHg  Pulse 90  Wt 219 lb 6.4 oz (99.519 kg)  Breastfeeding? Yes  General:  alert, cooperative and no distress  Abdomen: soft, non-tender; bowel sounds normal; no masses,  no organomegaly.  Incision clean, dry, intact        Assessment:     1. Postpartum examination following cesarean delivery 2. Pulmonary embolism on right 3.  HTN     Plan:    1. Contraception: IUD 2.  Start enalapril (safe in breastfeeding).  Pt referred to primary doctor for continued care of this. 3.  Continue Coumadin 4.  Follow up in: 1 year or as needed.

## 2015-06-29 ENCOUNTER — Ambulatory Visit: Payer: Non-veteran care

## 2015-06-29 ENCOUNTER — Ambulatory Visit: Payer: Non-veteran care | Admitting: Pharmacist

## 2016-11-01 IMAGING — CT CT ANGIO CHEST
2 of 9 series · 18 of 46 positions shown · IV contrast (APPLIED)
Comparison: None.

CLINICAL DATA: Shortness of breath

EXAM:
CT ANGIOGRAPHY CHEST WITH CONTRAST
TECHNIQUE: Multidetector CT imaging of the chest was performed using the
standard protocol during bolus administration of intravenous
contrast. Multiplanar CT image reconstructions and MIPs were
obtained to evaluate the vascular anatomy.
CONTRAST:  80mL OMNIPAQUE IOHEXOL 350 MG/ML SOLN

[Series 5: thins · axial · 0.77mm/px · z∈[+1224,+1458]mm · 15 of 265 slices shown]
[im 15/265  lung]
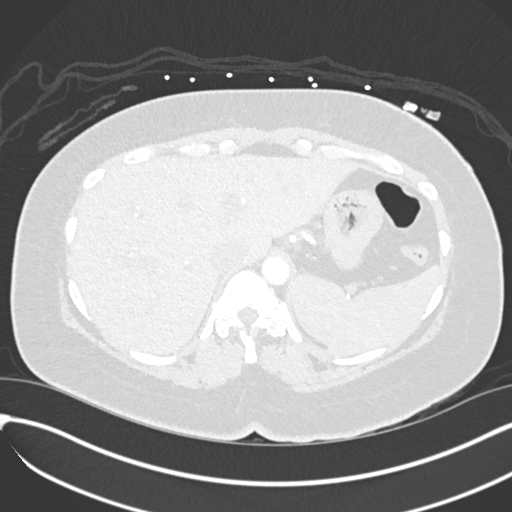
[im 30/265  soft-tissue]
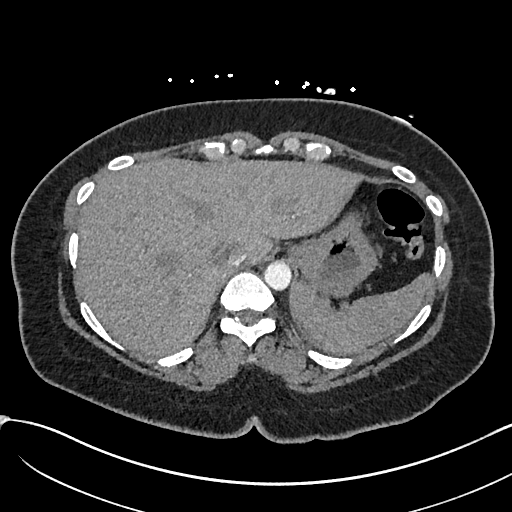
[im 45/265  lung]
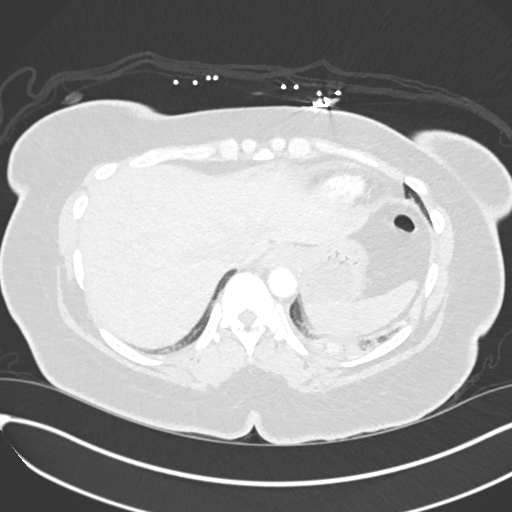
[im 59/265  soft-tissue]
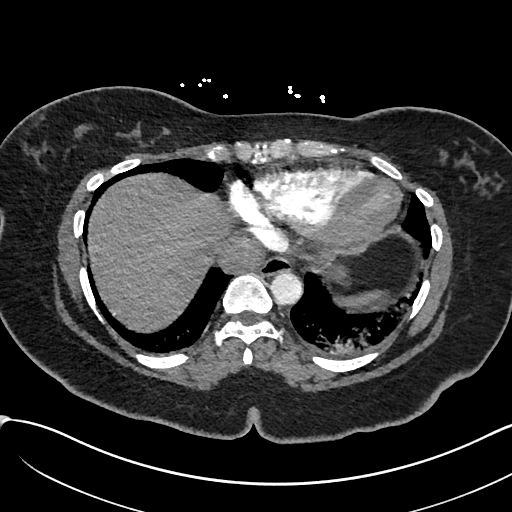
[im 89/265  lung]
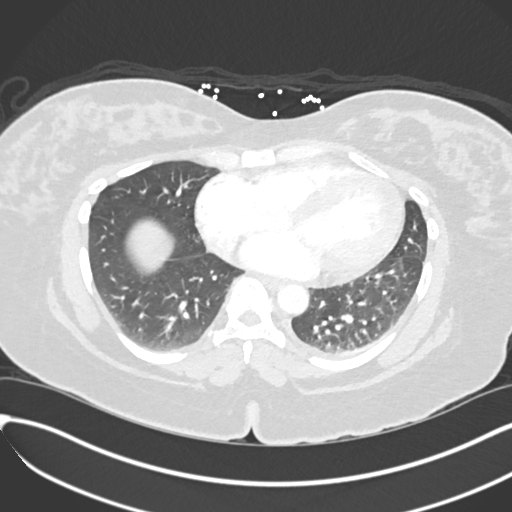
[im 103/265  soft-tissue]
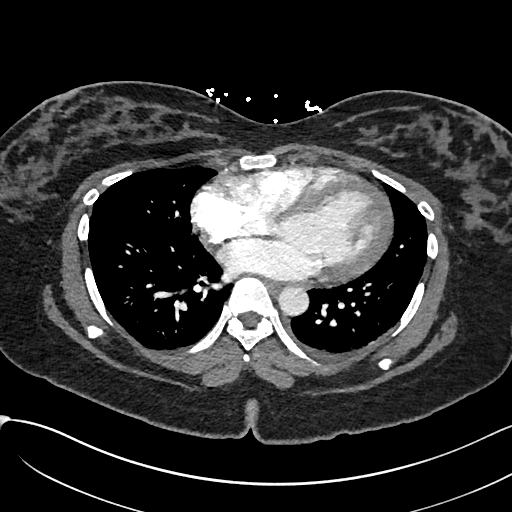
[im 118/265  lung]
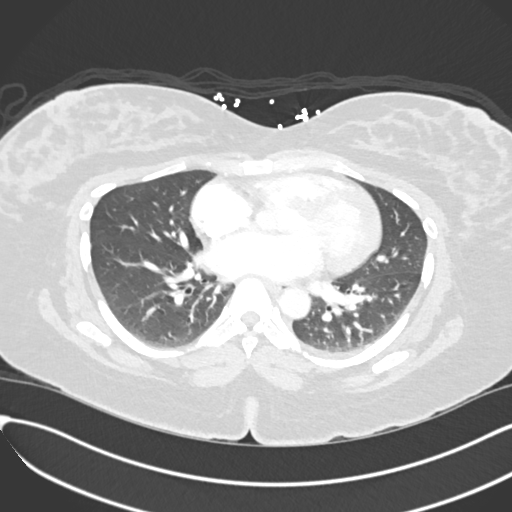
[im 133/265  soft-tissue]
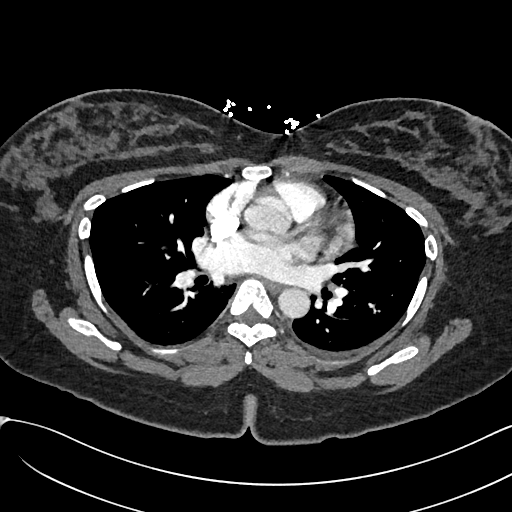
[im 147/265  lung]
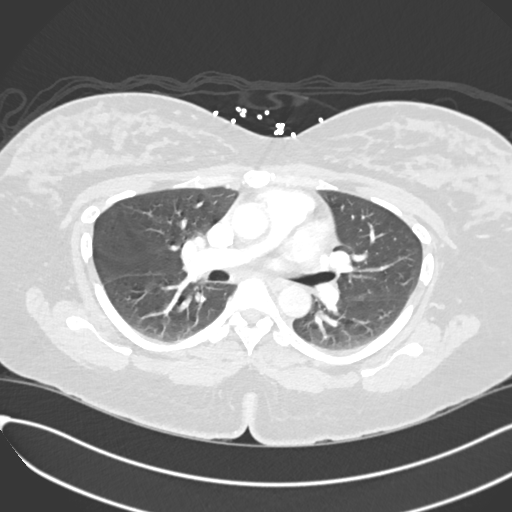
[im 162/265  soft-tissue]
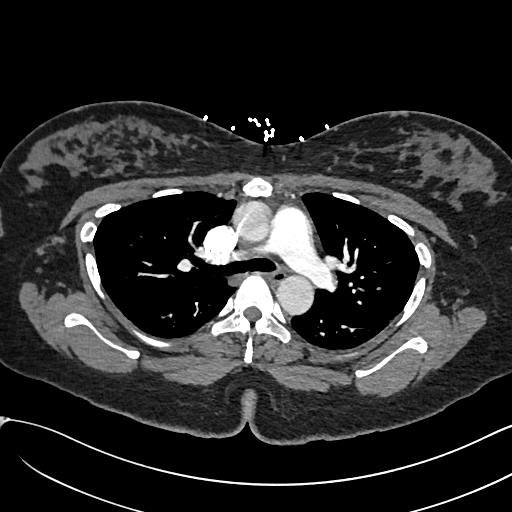
[im 177/265  lung]
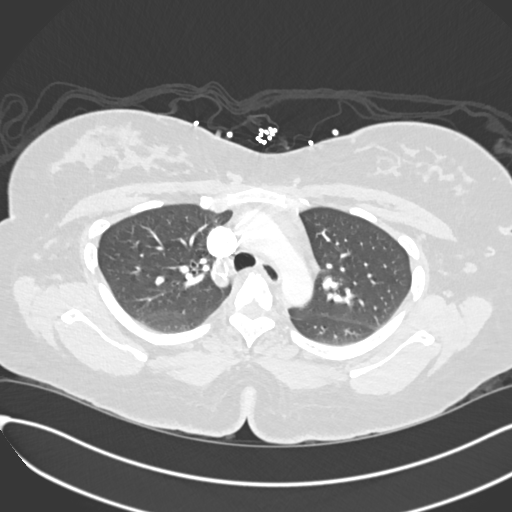
[im 206/265  soft-tissue]
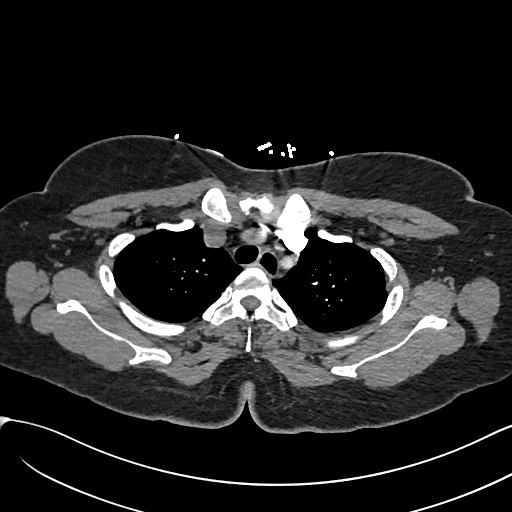
[im 221/265  lung]
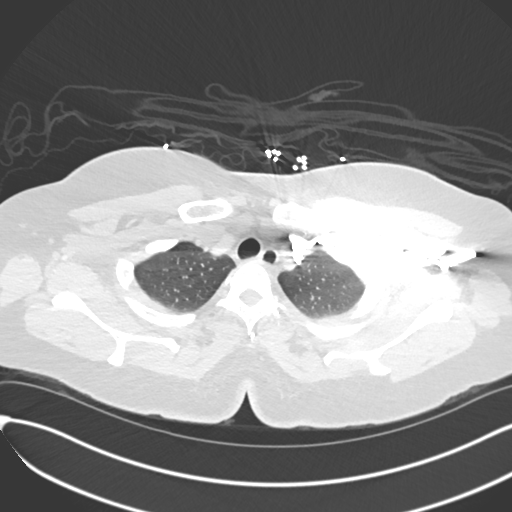
[im 235/265  soft-tissue]
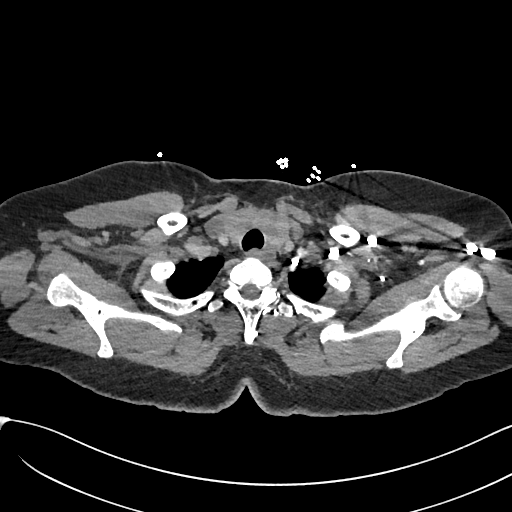
[im 250/265  lung]
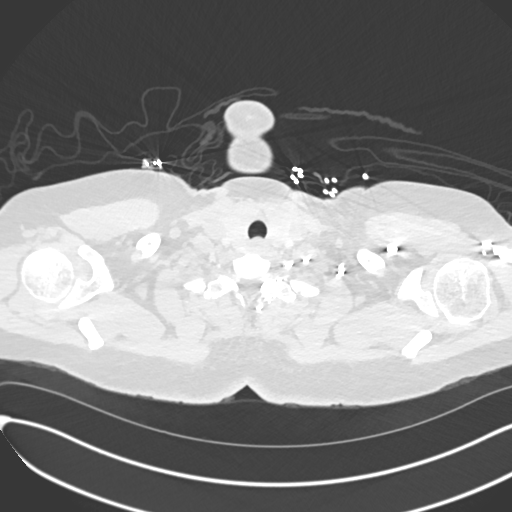

[Series 7: coronal mpr · coronal · 0.54mm/px · 3 of 118 slices shown]
[im 30/118  soft-tissue]
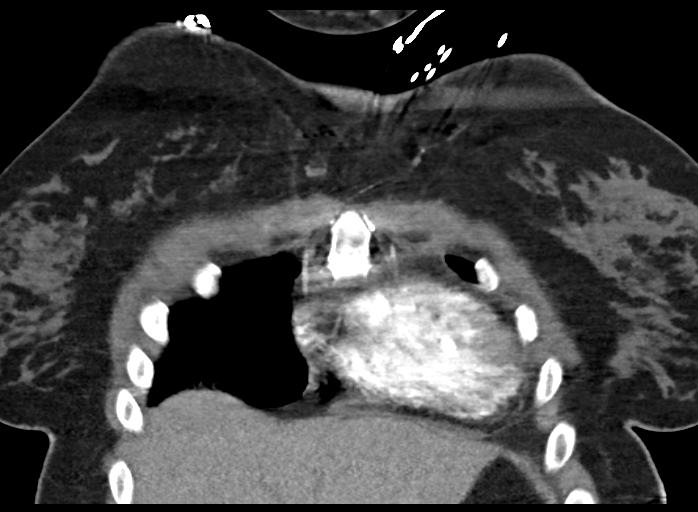
[im 59/118  soft-tissue]
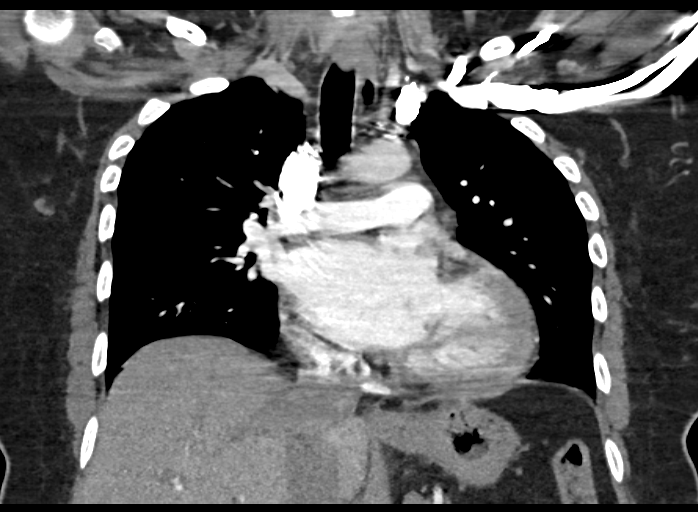
[im 88/118  soft-tissue]
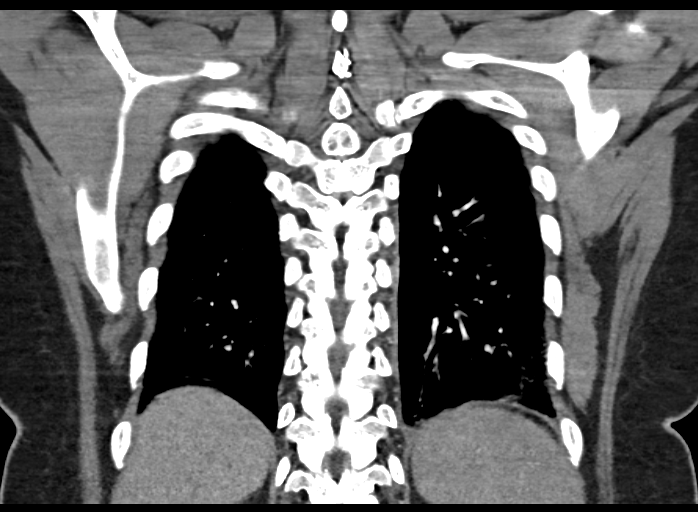

[18 of 46 positions shown; findings below may reference images not displayed]

FINDINGS: There is adequate opacification of the pulmonary arteries. There is
a small subsegmental pulmonary embolus in the left lower lobe (image
76/series 7). The main pulmonary artery, right main pulmonary artery
and left main pulmonary arteries are normal in size. The heart size
is normal. There is no pericardial effusion.

There are trace bilateral pleural effusions. There is left basilar
airspace disease likely reflecting atelectasis. There is no
pneumothorax.

There is no axillary, hilar, or mediastinal adenopathy.

There is no lytic or blastic osseous lesion.

The visualized portions of the upper abdomen are unremarkable.

Review of the MIP images confirms the above findings.
IMPRESSION: 1. Small subsegmental pulmonary embolus in the left lower lobe.
2. Trace bilateral pleural effusions.
Critical Value/emergent results were called by telephone at the time
of interpretation on 04/28/2015 at [DATE] to Dr. KIVEN TENN ,
who verbally acknowledged these results.

## 2017-04-17 ENCOUNTER — Encounter (HOSPITAL_COMMUNITY): Payer: Self-pay

## 2017-04-17 ENCOUNTER — Emergency Department (HOSPITAL_COMMUNITY): Payer: Non-veteran care

## 2017-04-17 ENCOUNTER — Emergency Department (HOSPITAL_COMMUNITY)
Admission: EM | Admit: 2017-04-17 | Discharge: 2017-04-17 | Disposition: A | Discharge status: Home / Self Care | Payer: Non-veteran care | Attending: Emergency Medicine | Admitting: Emergency Medicine

## 2017-04-17 DIAGNOSIS — M5442 Lumbago with sciatica, left side: Secondary | ICD-10-CM | POA: Insufficient documentation

## 2017-04-17 DIAGNOSIS — Z7901 Long term (current) use of anticoagulants: Secondary | ICD-10-CM | POA: Diagnosis not present

## 2017-04-17 DIAGNOSIS — Z91040 Latex allergy status: Secondary | ICD-10-CM | POA: Diagnosis not present

## 2017-04-17 DIAGNOSIS — Z79899 Other long term (current) drug therapy: Secondary | ICD-10-CM | POA: Insufficient documentation

## 2017-04-17 DIAGNOSIS — G8929 Other chronic pain: Secondary | ICD-10-CM

## 2017-04-17 DIAGNOSIS — I10 Essential (primary) hypertension: Secondary | ICD-10-CM | POA: Insufficient documentation

## 2017-04-17 DIAGNOSIS — Z91010 Allergy to peanuts: Secondary | ICD-10-CM | POA: Insufficient documentation

## 2017-04-17 DIAGNOSIS — Z87891 Personal history of nicotine dependence: Secondary | ICD-10-CM | POA: Insufficient documentation

## 2017-04-17 DIAGNOSIS — M545 Low back pain: Secondary | ICD-10-CM | POA: Diagnosis present

## 2017-04-17 LAB — POC URINE PREG, ED: PREG TEST UR: NEGATIVE

## 2017-04-17 MED ORDER — OXYCODONE HCL 5 MG PO TABS
5.0000 mg | ORAL_TABLET | Freq: Once | ORAL | Status: AC
  Administered 2017-04-17: 5 mg via ORAL
  Filled 2017-04-17: qty 1

## 2017-04-17 MED ORDER — DEXAMETHASONE SODIUM PHOSPHATE 10 MG/ML IJ SOLN
10.0000 mg | Freq: Once | INTRAMUSCULAR | Status: AC
  Administered 2017-04-17: 10 mg via INTRAMUSCULAR
  Filled 2017-04-17: qty 1

## 2017-04-17 NOTE — ED Notes (Signed)
Patient given water to drink per request and instructed to provide urine spec when able.

## 2017-04-17 NOTE — ED Triage Notes (Signed)
Pt reports lower "tailbone" pain that started yesterday after bending over.

## 2017-04-17 NOTE — ED Provider Notes (Signed)
[facility]-EMERGENCY DEPT Provider Note   CSN: [id number] Arrival date & time: 04/17/17  1616 By signing my name below, I, Bridgette HabermannMaria Tan, attest that this documentation has been prepared under the direction and in the presence of Felicie Mornavid Leilany Digeronimo, FNP. Electronically Signed: Bridgette HabermannMaria Tan, ED Scribe. 04/17/17. 4:45 PM.  History   Chief Complaint Chief Complaint  Patient presents with  . Back Pain    HPI The history is provided by the patient. No language interpreter was used.   HPI Comments: Danielle Estes is a 36 y.o. female with h/o HTN and PE, who presents to the Emergency Department complaining of lower back pain radiating to her tailbone beginning yesterday. Pt additionally notes she has LLE pain radiating downwards to her left foot. Pt reports she bent over yesterday and could not stand back up straight. States her pain is exacerbated with sitting and alleviating when standing. She has not tried any OTC medications PTA. Has h/o lumbar pain but reports this pain is worse. Pt denies fever, chills, urinary/bowel incontinence, difficulty urinating, or any other associated symptoms.  PCP: Pennie RushingAkinola, Olayinka D, MD  Past Medical History:  Diagnosis Date  . Anemia   . Bipolar 1 disorder (HCC)   . Depression   . GERD (gastroesophageal reflux disease)   . Headache   . Hypertension   . Multiple allergies   . Pulmonary embolism (HCC)   . Vaginal Pap smear, abnormal     Patient Active Problem List   Diagnosis Date Noted  . Pulmonary embolism on right (HCC) 05/10/2015  . Cholelithiasis without cholecystitis 05/10/2015  . Postoperative anemia due to acute blood loss 05/10/2015  . Pre-eclampsia superimposed on chronic hypertension, antepartum 04/18/2015  . Chronic hypertension   . Joellyn Quailsandy Walker Malformation, fetal, affecting care of mother, antepartum   . Small for dates affecting management of mother   . Supervision of high risk pregnancy in third trimester 01/07/2015  . Bipolar disorder (HCC)  01/07/2015  . H/O: C-section 01/07/2015  . Essential hypertension antepartum   . AMA (advanced maternal age) multigravida 35+     Past Surgical History:  Procedure Laterality Date  . BREAST SURGERY     had lumped removed at age 36. non cancer  . CESAREAN SECTION     C/S x 1  . CESAREAN SECTION N/A 04/19/2015   Procedure: CESAREAN SECTION;  Surgeon: Adam PhenixJames G Arnold, MD;  Location: [facility];  Service: Obstetrics;  Laterality: N/A;    OB History    Gravida Para Term Preterm AB Living   4 4 4  0 0 4   SAB TAB Ectopic Multiple Live Births   0 0 0 0 4       Home Medications    Prior to Admission medications   Medication Sig Start Date End Date Taking? Authorizing Provider  enalapril (VASOTEC) 20 MG tablet Take 1 tablet (20 mg total) by mouth daily. 06/24/15   Levie HeritageStinson, Jacob J, DO  ferrous sulfate 325 (65 FE) MG tablet Take 325 mg by mouth 3 (three) times daily with meals.     [provider]  triamcinolone ointment (KENALOG) 0.5 % Apply 1 application topically 2 (two) times daily. For eczema    [provider]  warfarin (COUMADIN) 5 MG tablet Take 1.5 tablets (7.5 mg total) by mouth daily at 6 PM. 06/22/15   Pennie RushingAkinola, Olayinka D, MD    Family History Family History  Problem Relation Age of Onset  . Hypertension Maternal Aunt   . Cancer Maternal  Aunt   . Heart disease Maternal Grandmother     Social History Social History  Substance Use Topics  . Smoking status: Former Games developer  . Smokeless tobacco: Never Used  . Alcohol use No     Allergies   Garlic; Ibuprofen; Latex; Peanut-containing drug products; Shellfish allergy; Vinegar [acetic acid]; Aspirin; Banana; Chocolate; Codeine; Eggs or egg-derived products; Iodine; Penicillins; Strawberry extract; Tylenol [acetaminophen]; Citrus; and Pineapple   Review of Systems Review of Systems  Constitutional: Negative for chills and fever.  Genitourinary: Negative for difficulty urinating.  Musculoskeletal: Positive  for arthralgias, back pain and myalgias.  All other systems reviewed and are negative.  Physical Exam Updated Vital Signs BP (!) 135/99 (BP Location: Right Arm)   Pulse 84   Temp 99 F (37.2 C) (Oral)   Resp 18   LMP 04/16/2017   SpO2 100%   Physical Exam  Constitutional: She appears well-developed and well-nourished.  HENT:  Head: Normocephalic.  Eyes: Conjunctivae are normal.  Cardiovascular: Normal rate, regular rhythm and normal heart sounds.  Exam reveals no gallop and no friction rub.   No murmur heard. Pulmonary/Chest: Effort normal. No respiratory distress.  Abdominal: She exhibits no distension.  Musculoskeletal: Normal range of motion. She exhibits tenderness.  Tender along L1 to sacrum area.  Neurological: She is alert.  Skin: Skin is warm and dry.  Psychiatric: She has a normal mood and affect. Her behavior is normal.  Nursing note and vitals reviewed.  ED Treatments / Results  DIAGNOSTIC STUDIES: Oxygen Saturation is 100% on RA, normal by my interpretation.   COORDINATION OF CARE: 4:45 PM-Discussed next steps with pt. Pt verbalized understanding and is agreeable with the plan.   Labs (all labs ordered are listed, but only abnormal results are displayed) Labs Reviewed - No data to display  EKG  EKG Interpretation None       Radiology No results found.  Procedures Procedures (including critical care time)  Medications Ordered in ED Medications - No data to display   Initial Impression / Assessment and Plan / ED Course  I have reviewed the triage vital signs and the nursing notes.  Pertinent labs & imaging results that were available during my care of the patient were reviewed by me and considered in my medical decision making (see chart for details).    Patient with acute exacerbation of chronic low back pain.  History of service related lumbar injury with 10% disability. She is currently followed by the [facility] in [place].  She  contacted them today, has an appointment scheduled for tomorrow, but they recommended an ED visit today for pain management. Sudden onset of low back pain radiating to left hip and leg. No urinary symptoms/saddle anesthesia/extremity weakness. No neurological deficits and normal neuro exam.  Patient is ambulatory.  No loss of bowel or bladder control.  No concern for cauda equina.  No fever, night sweats, weight loss, h/o cancer, IVDA, no recent procedure to back. No urinary symptoms suggestive of UTI. Tender to palpation along midline lumbar spine. Numerous medication allergies, including tylenol, ibuprofen, NSAID's. Patient given dose of roxicodone and decadron in ED. Supportive care and return precaution discussed. Appears safe for discharge at this time. Follow up as indicated in discharge paperwork.     .   Final Clinical Impressions(s) / ED Diagnoses   Final diagnoses:  Chronic midline low back pain with left-sided sciatica    New Prescriptions Discharge Medication List as of 04/17/2017  7:41 PM  I personally performed the services described in this documentation, which was scribed in my presence. The recorded information has been reviewed and is accurate.     Felicie Morn, NP 04/18/17 [id number]    Cathren Laine, MD 04/24/17 [phone]

## 2017-04-17 NOTE — ED Notes (Signed)
Pt ambulatory and stable, states understanding discharge instructions, unable to sign due to epic unable to let RN log in.

## 2017-05-25 ENCOUNTER — Emergency Department (HOSPITAL_COMMUNITY)
Admission: EM | Admit: 2017-05-25 | Discharge: 2017-05-26 | Disposition: A | Discharge status: Home / Self Care | Payer: Non-veteran care | Attending: Emergency Medicine | Admitting: Emergency Medicine

## 2017-05-25 ENCOUNTER — Encounter (HOSPITAL_COMMUNITY): Payer: Self-pay

## 2017-05-25 ENCOUNTER — Emergency Department (HOSPITAL_COMMUNITY): Payer: Non-veteran care

## 2017-05-25 DIAGNOSIS — Z91010 Allergy to peanuts: Secondary | ICD-10-CM | POA: Diagnosis not present

## 2017-05-25 DIAGNOSIS — T7840XA Allergy, unspecified, initial encounter: Secondary | ICD-10-CM

## 2017-05-25 DIAGNOSIS — F314 Bipolar disorder, current episode depressed, severe, without psychotic features: Secondary | ICD-10-CM | POA: Diagnosis not present

## 2017-05-25 DIAGNOSIS — F32A Depression, unspecified: Secondary | ICD-10-CM

## 2017-05-25 DIAGNOSIS — R079 Chest pain, unspecified: Secondary | ICD-10-CM | POA: Diagnosis present

## 2017-05-25 DIAGNOSIS — F329 Major depressive disorder, single episode, unspecified: Secondary | ICD-10-CM

## 2017-05-25 DIAGNOSIS — Z91040 Latex allergy status: Secondary | ICD-10-CM | POA: Diagnosis not present

## 2017-05-25 DIAGNOSIS — R55 Syncope and collapse: Secondary | ICD-10-CM | POA: Insufficient documentation

## 2017-05-25 DIAGNOSIS — Z87891 Personal history of nicotine dependence: Secondary | ICD-10-CM | POA: Insufficient documentation

## 2017-05-25 DIAGNOSIS — I10 Essential (primary) hypertension: Secondary | ICD-10-CM | POA: Diagnosis not present

## 2017-05-25 LAB — BASIC METABOLIC PANEL
ANION GAP: 8 (ref 5–15)
BUN: 8 mg/dL (ref 6–20)
CO2: 22 mmol/L (ref 22–32)
Calcium: 9.4 mg/dL (ref 8.9–10.3)
Chloride: 110 mmol/L (ref 101–111)
Creatinine, Ser: 0.77 mg/dL (ref 0.44–1.00)
GFR calc Af Amer: >60 mL/min (ref >60)
GFR calc non Af Amer: >60 mL/min (ref >60)
GLUCOSE: 74 mg/dL (ref 65–99)
POTASSIUM: 3.5 mmol/L (ref 3.5–5.1)
Sodium: 140 mmol/L (ref 135–145)

## 2017-05-25 LAB — CBC
HEMATOCRIT: 35 % — AB (ref 36.0–46.0)
Hemoglobin: 10.5 g/dL — ABNORMAL LOW (ref 12.0–15.0)
MCH: 23.3 pg — ABNORMAL LOW (ref 26.0–34.0)
MCHC: 30 g/dL (ref 30.0–36.0)
MCV: 77.8 fL — AB (ref 78.0–100.0)
Platelets: 364 10*3/uL (ref 150–400)
RBC: 4.5 MIL/uL (ref 3.87–5.11)
RDW: 18.4 % — ABNORMAL HIGH (ref 11.5–15.5)
WBC: 6.7 10*3/uL (ref 4.0–10.5)

## 2017-05-25 LAB — URINALYSIS, ROUTINE W REFLEX MICROSCOPIC
GLUCOSE, UA: NEGATIVE mg/dL
Hgb urine dipstick: NEGATIVE
Ketones, ur: >80 mg/dL — AB
LEUKOCYTES UA: NEGATIVE
Nitrite: NEGATIVE
PH: 6 (ref 5.0–8.0)
PROTEIN: NEGATIVE mg/dL
Specific Gravity, Urine: >1.03 — ABNORMAL HIGH (ref 1.005–1.030)

## 2017-05-25 LAB — RAPID URINE DRUG SCREEN, HOSP PERFORMED
AMPHETAMINES: NOT DETECTED
BARBITURATES: NOT DETECTED
BENZODIAZEPINES: NOT DETECTED
Cocaine: NOT DETECTED
Opiates: NOT DETECTED
Tetrahydrocannabinol: POSITIVE — AB

## 2017-05-25 MED ORDER — METHYLPREDNISOLONE SODIUM SUCC 125 MG IJ SOLR
80.0000 mg | Freq: Once | INTRAMUSCULAR | Status: AC
  Administered 2017-05-25: 80 mg via INTRAVENOUS

## 2017-05-25 MED ORDER — PREDNISONE 20 MG PO TABS
40.0000 mg | ORAL_TABLET | Freq: Every day | ORAL | Status: DC
  Administered 2017-05-26: 40 mg via ORAL
  Filled 2017-05-25: qty 2

## 2017-05-25 MED ORDER — DIPHENHYDRAMINE HCL 50 MG/ML IJ SOLN
25.0000 mg | Freq: Once | INTRAMUSCULAR | Status: AC
  Administered 2017-05-25: 25 mg via INTRAVENOUS
  Filled 2017-05-25: qty 1

## 2017-05-25 MED ORDER — SODIUM CHLORIDE 0.9 % IV BOLUS (SEPSIS)
1000.0000 mL | Freq: Once | INTRAVENOUS | Status: AC
  Administered 2017-05-25: 1000 mL via INTRAVENOUS

## 2017-05-25 MED ORDER — ALUM & MAG HYDROXIDE-SIMETH 200-200-20 MG/5ML PO SUSP: 30.0000 mL | Freq: Four times a day (QID) | ORAL | Status: DC | PRN

## 2017-05-25 MED ORDER — ONDANSETRON HCL 4 MG PO TABS: 4.0000 mg | ORAL_TABLET | Freq: Three times a day (TID) | ORAL | Status: DC | PRN

## 2017-05-25 MED ORDER — FAMOTIDINE 20 MG PO TABS
20.0000 mg | ORAL_TABLET | Freq: Two times a day (BID) | ORAL | Status: DC
  Administered 2017-05-25 – 2017-05-26 (×2): 20 mg via ORAL
  Filled 2017-05-25 (×2): qty 1

## 2017-05-25 MED ORDER — DIPHENHYDRAMINE HCL 25 MG PO CAPS: 25.0000 mg | ORAL_CAPSULE | Freq: Four times a day (QID) | ORAL | Status: DC

## 2017-05-25 MED ORDER — METHYLPREDNISOLONE SODIUM SUCC 125 MG IJ SOLR
80.0000 mg | Freq: Once | INTRAMUSCULAR | Status: DC
  Filled 2017-05-25: qty 2

## 2017-05-25 NOTE — ED Notes (Signed)
Patient transported to CT 
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
## 2017-05-25 NOTE — ED Provider Notes (Signed)
[facility]-EMER[facility]ENCY DEPT Provider Note   CSN: [id number] Arrival date & time: 05/25/17  1449     History   Chief Complaint Chief Complaint  Patient presents with  . Chest Pain    HPI Danielle Estes is a 36 y.o. female who presents today with a possible allergic reaction and worsening depression. Patient states that she was at a friend's hotel room when she was exposed to some vinegar which she is allergic to. She states that she did not eat any of the finger but that it was in the room and spilled and that she was surrounded by. Patient states that she had a syncopal episode where she passed out and hit the floor. Patient states that friends told her she was out for a few minutes. Patient reports that she regain consciousness was on the floor and did not cut there. Patient attempted to leave the room when she reportedly had another sig one episode outside. Patient reports that initially after vinegar exposure she started having some chest pain. She did not take any medications for the pain. On ED arrival she states that pain has completely resolved. Patient states initially she felt like her tongue might of been swollen but states that that has resolved on ED arrival. She denies any current difficulty breathing, difficulty swallowing, chest pain. Patient reports a history of bipolar depression which worsened in the last month or so after several recent stress triggers. She reports that she has been homeless for the last several months and was recently kicked out from of her cousin's house which has caused increasing stress. Patient states that she "does not feel like a woman because she cannot provide for her children" which has caused her to have worsening depressive thoughts patient sometimes states that she does not feel like doing anything and feels worthless because she has been a bad mother. Patient reports that she was previously on medication for her bipolar but has not taken any in the past  several months. She states that she has been self-medicating by smoking marijuana 2 times a day. Her last reported use of marijuana was last night. She denies any cigarette smoking or illicit drug use. Patient denies any SI/HI. She does report that she has been having some hallucinations but states that this is ongoing and is normal for her.  The history is provided by the patient.    Past Medical History:  Diagnosis Date  . Anemia   . Bipolar 1 disorder (HCC)   . Depression   . GERD (gastroesophageal reflux disease)   . Headache   . Hypertension   . Multiple allergies   . Pulmonary embolism (HCC)   . Vaginal Pap smear, abnormal     Patient Active Problem List   Diagnosis Date Noted  . Pulmonary embolism on right (HCC) 05/10/2015  . Cholelithiasis without cholecystitis 05/10/2015  . Postoperative anemia due to acute blood loss 05/10/2015  . Pre-eclampsia superimposed on chronic hypertension, antepartum 04/18/2015  . Chronic hypertension   . Joellyn Quailsandy Walker Malformation, fetal, affecting care of mother, antepartum   . Small for dates affecting management of mother   . Supervision of high risk pregnancy in third trimester 01/07/2015  . Bipolar disorder (HCC) 01/07/2015  . H/O: C-section 01/07/2015  . Essential hypertension antepartum   . AMA (advanced maternal age) multigravida 35+     Past Surgical History:  Procedure Laterality Date  . BREAST SURGERY     had lumped removed at age 36. non cancer  .  CESAREAN SECTION     C/S x 1  . CESAREAN SECTION N/A 04/19/2015   Procedure: CESAREAN SECTION;  Surgeon: Adam Phenix, MD;  Location: [facility];  Service: Obstetrics;  Laterality: N/A;    OB History    Gravida Para Term Preterm AB Living   4 4 4  0 0 4   SAB TAB Ectopic Multiple Live Births   0 0 0 0 4       Home Medications    Prior to Admission medications   Medication Sig Start Date End Date Taking? Authorizing Provider  enalapril (VASOTEC) 20 MG tablet Take 1 tablet (20  mg total) by mouth daily. Patient not taking: Reported on 05/25/2017 06/24/15   Levie Heritage, DO  warfarin (COUMADIN) 5 MG tablet Take 1.5 tablets (7.5 mg total) by mouth daily at 6 PM. Patient not taking: Reported on 05/25/2017 06/22/15   Pennie Rushing, MD    Family History Family History  Problem Relation Age of Onset  . Hypertension Maternal Aunt   . Cancer Maternal Aunt   . Heart disease Maternal Grandmother     Social History Social History  Substance Use Topics  . Smoking status: Former Games developer  . Smokeless tobacco: Never Used  . Alcohol use No     Allergies   Garlic; Ibuprofen; Latex; Peanut-containing drug products; Shellfish allergy; Vinegar [acetic acid]; Aspirin; Banana; Chocolate; Codeine; Eggs or egg-derived products; Iodine; Penicillins; Strawberry extract; Tylenol [acetaminophen]; Citrus; and Pineapple   Review of Systems Review of Systems  Constitutional: Negative for chills and fever.  HENT: Negative for congestion, rhinorrhea and sore throat.   Respiratory: Negative for cough and shortness of breath.   Cardiovascular: Negative for chest pain.  Gastrointestinal: Negative for abdominal pain, diarrhea, nausea and vomiting.  Genitourinary: Negative for dysuria and hematuria.  Musculoskeletal: Negative for back pain and neck pain.  Skin: Negative for rash.  Neurological: Negative for dizziness, weakness, numbness and headaches.  Psychiatric/Behavioral: Positive for dysphoric mood. Negative for confusion and suicidal ideas.  All other systems reviewed and are negative.    Physical Exam Updated Vital Signs BP (!) 129/107 (BP Location: Right Arm)   Pulse 74   Temp 98.9 F (37.2 C) (Oral)   Resp 12   SpO2 100%   Physical Exam  Constitutional: She is oriented to person, place, and time. She appears well-developed and well-nourished.  Very tearful throughout exam. No acute distress.  HENT:  Head: Normocephalic and atraumatic.  Mouth/Throat:  Oropharynx is clear and moist and mucous membranes are normal.  Eyes: Conjunctivae, EOM and lids are normal. Pupils are equal, round, and reactive to light.  Neck: Full passive range of motion without pain.  Cardiovascular: Normal rate, regular rhythm, normal heart sounds and normal pulses.  Exam reveals no gallop and no friction rub.   No murmur heard. Pulmonary/Chest: Effort normal and breath sounds normal.  Abdominal: Soft. Normal appearance. There is no tenderness. There is no rigidity and no guarding.  Musculoskeletal: Normal range of motion.  Neurological: She is alert and oriented to person, place, and time.  Follows commands, Moves all extremities  5/5 strength to BUE and BLE  Sensation intact throughout  Normal finger to nose. No pronator drift. No gait abnormalities  No slurred speech. No facial droop.   Skin: Skin is warm and dry. Capillary refill takes less than 2 seconds.  Psychiatric: Her speech is normal. She is withdrawn. She exhibits a depressed mood.  Patient is slightly withdrawn and exhibits  a depressed mood. She is intermittently tearful throughout exam and is oftentimes inconsolable.  Nursing note and vitals reviewed.    ED Treatments / Results  Labs (all labs ordered are listed, but only abnormal results are displayed) Labs Reviewed  CBC - Abnormal; Notable for the following:       Result Value   Hemoglobin 10.5 (*)    HCT 35.0 (*)    MCV 77.8 (*)    MCH 23.3 (*)    RDW 18.4 (*)    All other components within normal limits  URINALYSIS, ROUTINE W REFLEX MICROSCOPIC - Abnormal; Notable for the following:    Specific Gravity, Urine >1.030 (*)    Bilirubin Urine SMALL (*)    Ketones, ur >80 (*)    All other components within normal limits  RAPID URINE DRUG SCREEN, HOSP PERFORMED - Abnormal; Notable for the following:    Tetrahydrocannabinol POSITIVE (*)    All other components within normal limits  BASIC METABOLIC PANEL  I-STAT BETA HCG BLOOD, ED ([facility],  [facility], [facility] ONLY)    EKG  EKG Interpretation  Date/Time:  Thursday May 25 2017 14:56:45 EDT Ventricular Rate:  78 PR Interval:    QRS Duration: 98 QT Interval:  404 QTC Calculation: 461 R Axis:   45 Text Interpretation:  Sinus arrhythmia Prominent P waves, nondiagnostic RSR' in V1 or V2, probably normal variant No significant change since last tracing Confirmed by Shaune Pollack [phone]) on 05/25/2017 4:43:58 PM       Radiology Ct Head Wo Contrast  Result Date: 05/25/2017 CLINICAL DATA:  Bipolar disorder ; syncope. EXAM: CT HEAD WITHOUT CONTRAST TECHNIQUE: Contiguous axial images were obtained from the base of the skull through the vertex without intravenous contrast. COMPARISON:  07/24/2010 head CT FINDINGS: Brain: No evidence of acute infarction, hemorrhage, hydrocephalus, extra-axial collection or mass lesion/mass effect. Vascular: No hyperdense vessel or unexpected calcification. Skull: Normal. Negative for fracture or focal lesion. Sinuses/Orbits: No acute finding. Other: None. IMPRESSION: Normal head CT Electronically Signed   By: Tollie Eth M.D.   On: 05/25/2017 19:07    Procedures Procedures (including critical care time)  Medications Ordered in ED Medications  alum & mag hydroxide-simeth (MAALOX/MYLANTA) 200-200-20 MG/5ML suspension 30 mL (not administered)  ondansetron (ZOFRAN) tablet 4 mg (not administered)  predniSONE (DELTASONE) tablet 40 mg (not administered)  diphenhydrAMINE (BENADRYL) capsule 25 mg (not administered)  famotidine (PEPCID) tablet 20 mg (not administered)  sodium chloride 0.9 % bolus 1,000 mL (1,000 mLs Intravenous New Bag/Given 05/25/17 1743)  diphenhydrAMINE (BENADRYL) injection 25 mg (25 mg Intravenous Given 05/25/17 1744)  methylPREDNISolone sodium succinate (SOLU-MEDROL) 125 mg/2 mL injection 80 mg (80 mg Intravenous Given 05/25/17 1758)     Initial Impression / Assessment and Plan / ED Course  I have reviewed the triage vital signs and the nursing  notes.  Pertinent labs & imaging results that were available during my care of the patient were reviewed by me and considered in my medical decision making (see chart for details).     36 year old female who presents today after an exposure to vinegar which she is allergic to. Initially was reporting some chest pain and states that that has improved on ED arrival. Currently she is not complaining of any chest pain. No symptoms of difficulty breathing or difficulty swelling. Did have potential to simple episodes where she hit the ground and had lost consciousness. Patient is not currently on blood thinners but states that she was previously on them and stopped  in October 2017. No neuro deficits on exam. Plan to give Benadryl Solu-Medrol department for allergic reaction. Will check basic labs including CK, BMP, UA, UDS. Will obtain CT head to evaluate for any acute intracranial amount. Given that patient has significant depressive symptoms and has recently been stressed, would likely benefit from a TTS consultation for further behavioral health evaluation.  Discussed with Ala Dach from behavior health. Patient does meet criteria for inpatient psych admission. They do not have any beds available today. They recommend keeping her overnight they will have beds available tomorrow morning and she will be admitted in the morning.  Discussed plan with patient. She is agreeable to staying.  Labs and imaging reviewed. CT head is negative for any acute intracranial normality. CBC shows slight anemia but review of records that this is consistent with prior. UA shows some small bilirubin and ketones. Negative for any acute signs of infection. BMP within normal limits. Rapid urine drug screen shows positive cannabis use.  Patient is now medically cleared. Will plan to transfer patient to the psychiatric positive for overnight evaluation with plans to inpatient admission to psych tomorrow morning.  Final Clinical  Impressions(s) / ED Diagnoses   Final diagnoses:  Allergic reaction, initial encounter  Depression, unspecified depression type    New Prescriptions New Prescriptions   No medications on file     Rosana Hoes 05/25/17 2007    Shaune Pollack, MD 05/27/17 1356

## 2017-05-26 ENCOUNTER — Encounter (HOSPITAL_COMMUNITY): Payer: Self-pay | Admitting: *Deleted

## 2017-05-26 ENCOUNTER — Inpatient Hospital Stay (HOSPITAL_COMMUNITY)
Admission: AD | Admit: 2017-05-26 | Discharge: 2017-06-02 | DRG: 885 | Disposition: A | Discharge status: Home / Self Care | Payer: Medicaid Other | Source: Intra-hospital | Attending: Psychiatry | Admitting: Psychiatry

## 2017-05-26 DIAGNOSIS — K219 Gastro-esophageal reflux disease without esophagitis: Secondary | ICD-10-CM | POA: Diagnosis present

## 2017-05-26 DIAGNOSIS — F319 Bipolar disorder, unspecified: Secondary | ICD-10-CM | POA: Diagnosis present

## 2017-05-26 DIAGNOSIS — Z79899 Other long term (current) drug therapy: Secondary | ICD-10-CM | POA: Diagnosis not present

## 2017-05-26 DIAGNOSIS — F3164 Bipolar disorder, current episode mixed, severe, with psychotic features: Principal | ICD-10-CM | POA: Diagnosis present

## 2017-05-26 DIAGNOSIS — Z86711 Personal history of pulmonary embolism: Secondary | ICD-10-CM

## 2017-05-26 DIAGNOSIS — F314 Bipolar disorder, current episode depressed, severe, without psychotic features: Secondary | ICD-10-CM | POA: Diagnosis not present

## 2017-05-26 DIAGNOSIS — F4310 Post-traumatic stress disorder, unspecified: Secondary | ICD-10-CM | POA: Diagnosis present

## 2017-05-26 DIAGNOSIS — Z885 Allergy status to narcotic agent status: Secondary | ICD-10-CM

## 2017-05-26 DIAGNOSIS — I10 Essential (primary) hypertension: Secondary | ICD-10-CM | POA: Clinically undetermined

## 2017-05-26 DIAGNOSIS — Z91013 Allergy to seafood: Secondary | ICD-10-CM | POA: Diagnosis not present

## 2017-05-26 DIAGNOSIS — Z880 Allergy status to penicillin: Secondary | ICD-10-CM

## 2017-05-26 DIAGNOSIS — F419 Anxiety disorder, unspecified: Secondary | ICD-10-CM | POA: Diagnosis present

## 2017-05-26 DIAGNOSIS — Z886 Allergy status to analgesic agent status: Secondary | ICD-10-CM

## 2017-05-26 DIAGNOSIS — G4700 Insomnia, unspecified: Secondary | ICD-10-CM | POA: Diagnosis present

## 2017-05-26 DIAGNOSIS — Z87891 Personal history of nicotine dependence: Secondary | ICD-10-CM | POA: Diagnosis not present

## 2017-05-26 DIAGNOSIS — Z590 Homelessness: Secondary | ICD-10-CM | POA: Diagnosis not present

## 2017-05-26 DIAGNOSIS — Z91018 Allergy to other foods: Secondary | ICD-10-CM | POA: Diagnosis not present

## 2017-05-26 DIAGNOSIS — R443 Hallucinations, unspecified: Secondary | ICD-10-CM | POA: Diagnosis not present

## 2017-05-26 DIAGNOSIS — M1990 Unspecified osteoarthritis, unspecified site: Secondary | ICD-10-CM | POA: Diagnosis present

## 2017-05-26 DIAGNOSIS — G43909 Migraine, unspecified, not intractable, without status migrainosus: Secondary | ICD-10-CM | POA: Diagnosis present

## 2017-05-26 DIAGNOSIS — Z91410 Personal history of adult physical and sexual abuse: Secondary | ICD-10-CM | POA: Diagnosis not present

## 2017-05-26 DIAGNOSIS — Z98891 History of uterine scar from previous surgery: Secondary | ICD-10-CM

## 2017-05-26 DIAGNOSIS — Z9114 Patient's other noncompliance with medication regimen: Secondary | ICD-10-CM

## 2017-05-26 DIAGNOSIS — Z888 Allergy status to other drugs, medicaments and biological substances status: Secondary | ICD-10-CM

## 2017-05-26 DIAGNOSIS — Z91010 Allergy to peanuts: Secondary | ICD-10-CM | POA: Diagnosis not present

## 2017-05-26 DIAGNOSIS — F1290 Cannabis use, unspecified, uncomplicated: Secondary | ICD-10-CM | POA: Diagnosis not present

## 2017-05-26 DIAGNOSIS — F515 Nightmare disorder: Secondary | ICD-10-CM | POA: Diagnosis not present

## 2017-05-26 MED ORDER — HYDROXYZINE HCL 25 MG PO TABS
25.0000 mg | ORAL_TABLET | Freq: Once | ORAL | Status: AC
  Administered 2017-05-26: 25 mg via ORAL
  Filled 2017-05-26: qty 1

## 2017-05-26 MED ORDER — HYDROXYZINE HCL 25 MG PO TABS
25.0000 mg | ORAL_TABLET | Freq: Three times a day (TID) | ORAL | Status: DC | PRN
  Administered 2017-05-26 – 2017-05-30 (×3): 25 mg via ORAL
  Filled 2017-05-26 (×4): qty 1

## 2017-05-26 MED ORDER — PREDNISONE 20 MG PO TABS
40.0000 mg | ORAL_TABLET | Freq: Every day | ORAL | Status: AC
  Administered 2017-05-27 – 2017-05-30 (×4): 40 mg via ORAL
  Filled 2017-05-26 (×4): qty 2

## 2017-05-26 MED ORDER — MAGNESIUM HYDROXIDE 400 MG/5ML PO SUSP: 30.0000 mL | Freq: Every day | ORAL | Status: DC | PRN

## 2017-05-26 MED ORDER — TRAZODONE HCL 50 MG PO TABS
50.0000 mg | ORAL_TABLET | Freq: Every evening | ORAL | Status: DC | PRN
  Administered 2017-05-26 – 2017-05-27 (×2): 50 mg via ORAL
  Filled 2017-05-26 (×2): qty 1

## 2017-05-26 MED ORDER — BOOST / RESOURCE BREEZE PO LIQD
1.0000 | Freq: Three times a day (TID) | ORAL | Status: DC
  Administered 2017-05-26 – 2017-05-27 (×3): 1 via ORAL
  Filled 2017-05-26 (×11): qty 1

## 2017-05-26 MED ORDER — FAMOTIDINE 20 MG PO TABS
20.0000 mg | ORAL_TABLET | Freq: Two times a day (BID) | ORAL | Status: AC
  Administered 2017-05-26 – 2017-05-30 (×8): 20 mg via ORAL
  Filled 2017-05-26 (×10): qty 1

## 2017-05-26 MED ORDER — ONDANSETRON HCL 4 MG PO TABS: 4.0000 mg | ORAL_TABLET | Freq: Three times a day (TID) | ORAL | Status: DC | PRN

## 2017-05-26 MED ORDER — PROCHLORPERAZINE MALEATE 5 MG PO TABS
10.0000 mg | ORAL_TABLET | Freq: Once | ORAL | Status: AC
  Administered 2017-05-26: 10 mg via ORAL
  Filled 2017-05-26: qty 2

## 2017-05-26 NOTE — Tx Team (Signed)
Initial Treatment Plan 05/26/2017 5:17 PM [name] [id number]    PATIENT STRESSORS: Financial difficulties Medication change or noncompliance Occupational concerns   PATIENT STRENGTHS: Ability for insight Average or above average intelligence Communication skills Motivation for treatment/growth Supportive family/friends   PATIENT IDENTIFIED PROBLEMS: Depression  "Stabilize emotions"  "Express emotions without anger"  "Get help with housing"               DISCHARGE CRITERIA:  Ability to meet basic life and health needs Improved stabilization in mood, thinking, and/or behavior Motivation to continue treatment in a less acute level of care Need for constant or close observation no longer present Verbal commitment to aftercare and medication compliance  PRELIMINARY DISCHARGE PLAN: Outpatient therapy Return to previous living arrangement  PATIENT/FAMILY INVOLVEMENT: This treatment plan has been presented to and reviewed with the patient, Danielle Estes.  The patient and family have been given the opportunity to ask questions and make suggestions.  Carleene OverlieMiddleton, Argentina Kosch P, RN 05/26/2017, 5:17 PM

## 2017-05-26 NOTE — Progress Notes (Signed)
Patient has multiple food allergies and intolerances, discussed strategies for patient to obtain nutrition while here, dietary consult entered, Boost/Breeze ordered as lactose free nutritional supplement

## 2017-05-26 NOTE — Progress Notes (Signed)
Adult Psychoeducational Group Note  Date:  05/26/2017 Time:  9:22 PM  Group Topic/Focus:  Wrap-Up Group:   The focus of this group is to help patients review their daily goal of treatment and discuss progress on daily workbooks.  Participation Level:  Active  Participation Quality:  Appropriate  Affect:  Appropriate  Cognitive:  Oriented  Insight: Appropriate  Engagement in Group:  Engaged  Modes of Intervention:  Socialization and Support  Additional Comments:  Patient attended and participated in group tonight. She reports that today she went outside. She came into the hospital today and has been socializing in the day room. She went had a chance to visit with her children earlier.  Lita MainsFrancis, Allessandra Bernardi [facility] 05/26/2017, 9:22 PM

## 2017-05-26 NOTE — ED Notes (Signed)
Case manager at bedside 
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
## 2017-05-26 NOTE — ED Notes (Signed)
Had lengthy discussion with patient regarding homelessness and hopelessness due to situation. Pt is tearful, states [facility] without much success.

## 2017-05-26 NOTE — [facility] Counselor (Signed)
[facility]  Accepting provider is Elta GuadeloupeLaurie Parks NP Room number 505-2 Pt can come after 1500 Report number [phone] RN notified   [facility]ristin Selenne Coggin LPC, LCAS

## 2017-05-26 NOTE — ED Notes (Addendum)
Accepted to [facility], accepting Dr Jama Flavorsobos, 505-2. To wait until 1500.

## 2017-05-26 NOTE — ED Notes (Signed)
[facility] of belongings

## 2017-05-27 ENCOUNTER — Encounter (HOSPITAL_COMMUNITY): Payer: Self-pay | Admitting: Psychiatry

## 2017-05-27 DIAGNOSIS — F1290 Cannabis use, unspecified, uncomplicated: Secondary | ICD-10-CM

## 2017-05-27 DIAGNOSIS — G4700 Insomnia, unspecified: Secondary | ICD-10-CM

## 2017-05-27 DIAGNOSIS — F314 Bipolar disorder, current episode depressed, severe, without psychotic features: Secondary | ICD-10-CM

## 2017-05-27 MED ORDER — QUETIAPINE FUMARATE 50 MG PO TABS
50.0000 mg | ORAL_TABLET | Freq: Every day | ORAL | Status: DC
  Administered 2017-05-27: 50 mg via ORAL
  Filled 2017-05-27 (×3): qty 1

## 2017-05-27 NOTE — Progress Notes (Signed)
Writer has observed he  up in the dayroom playing cards briefly with other patients. Writer spoke with her 1:1 and she reports trying to get used to being here in a different place. She received a prn visteril for anxiety. Writer informed her of medications available for later, Support given and safety maintained on unit with 15 min checks.

## 2017-05-27 NOTE — [facility] Group Notes (Signed)
[facility] LCSW Group Therapy Note  05/27/2017  and  11:15 AM  Type of Therapy and Topic:  Group Therapy: Avoiding Self-Sabotaging and Enabling Behaviors  Participation Level:  Active  Participation Quality:  Attentive and Sharing  Affect:  Appropriate  Cognitive:  Alert and Oriented  Insight:  Limited  Engagement in Therapy:  Improving   Therapeutic models used: Cognitive Behavioral Therapy,  Person-Centered Therapy and Motivational Interviewing  Modes of Intervention:  Discussion, Exploration, Orientation, Rapport Building, Socialization and Support   Summary of Progress/Problems:  The main focus of today's process group was for the patient to identify ways in which they have in the past sabotaged their own recovery. Motivational Interviewing was utilized to identify motivation they may have for wanting to change. The patient identified frustrations of motherhood with no concept of how to take care of self.   Carney Bernatherine C Harrill, LCSW

## 2017-05-27 NOTE — Progress Notes (Signed)
Writer spoke with patient 1:1 at her request shortly after her visit with her husband. She reported that she found out that her husband and kids may be homeless on tomorrow because of their financial situation. She reports that he and the kids live in a hotel and his job does not pay him until next week instead of this week. Writer encouraged her to have her huband check different resource in the area. She remained calm while talking about this situation and reprted that she wanted to talk it out instead of asking for a pill. Writer praised her for her decision. Support given and safety maintained on unit with 15 min checks.
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
## 2017-05-27 NOTE — H&P (Signed)
Psychiatric Admission Assessment Adult  Patient Identification: Danielle Estes MRN:  [id number] Date of Evaluation:  05/27/2017 Chief Complaint:  BIPOLAR I DISORDER RECURRENT; DEPRESSED; SEVERE WITHOUT PSYCHOTIC FEATURES Principal Diagnosis: Bipolar disorder ([place]) Diagnosis:   Patient Active Problem List   Diagnosis Date Noted  . Bipolar 1 disorder, depressed, severe ([place]) [F31.4] 05/26/2017  . Pulmonary embolism on right ([place]) [I26.99] 05/10/2015  . Cholelithiasis without cholecystitis [K80.20] 05/10/2015  . Postoperative anemia due to acute blood loss [D62] 05/10/2015  . Pre-eclampsia superimposed on chronic hypertension, antepartum [O11.9, O10.019] 04/18/2015  . Chronic hypertension [I10]   . [place] Malformation, fetal, affecting care of mother, antepartum [O64.3HL4]   . Small for dates affecting management of mother [O36.5990]   . Supervision of high risk pregnancy in third trimester [O09.93] 01/07/2015  . Bipolar disorder ([place]) [F31.9] 01/07/2015  . H/O: C-section [T62.563] 01/07/2015  . Essential hypertension antepartum [O10.019]   . AMA (advanced maternal age) multigravida 33+ [O09.529]    History of Present Illness: patient is a 36 year old African-American female who was admitted due to having hallucination, crying spells, homicidal thoughts and consider herself as a failure.  In the beginning she felt that she may have allergic reaction to vinegar but she had a history of bipolar disorder when she was in [employer].  She admitted noncompliant with medication.  As 2 years due to unable to afford.  Her major stressor is living situation.  She is homeless.  She is unemployed.  Both she believe her husband is very supportive but sometimes she gets very upset and have homicidal thoughts towards him.  Together they have 3 children were 76, 45 and 63 years old.  They've been living in different places after they lost their home.  Patient told recently she's been very depressed and  having bad thoughts in her head.  She is having auditory and visual hallucinations telling that she is not a good mother.  Voices telling him to join her parents were deceased.  She is seeing bugs crawling and she feels people following her.  She also seeing spider and wrapped were following her.  She is easily forgetful.  She used to take Seroquel and trazodone for her manic symptoms.  Patient is complaining of poor sleep, irritability, mood swing, crying spells with lack of motivation to do things.  She is withdrawn, isolated, irritable with decreased attention and concentration.  She has excessive guilt and hopelessness.  Patient has history of hypertension while she was pregnant and she used to take blood thinner for history of blood clot.  However she stopped taking blood pressure medication and Coumadin.  Patient positive for cannabis.  She has multiple health issues including arthritis, migraine.  Patient currently not seeing any psychiatrist.  Patient endorse history of suicidal attempt by taking overdose 4 years ago but never seek any treatment.  In the unit she was calm but tearful and withdrawn.  Associated Signs/Symptoms: Depression Symptoms:  depressed mood, anhedonia, insomnia, psychomotor retardation, fatigue, feelings of worthlessness/guilt, difficulty concentrating, hopelessness, impaired memory, (Hypo) Manic Symptoms:  Hallucinations, Impulsivity, Irritable Mood, Labiality of Mood, Anxiety Symptoms:  Social Anxiety, Psychotic Symptoms:  Delusions, Hallucinations: Auditory Visual seeing bugs and also hearing voices telling her bad things Paranoia, PTSD Symptoms: NA Total Time spent with patient: 45 minutes  Past Psychiatric History: patient has history of bipolar disorder and she was getting treatment while she was in [employer].  Patient endorse history of suicidal attempt by taking overdose on medication but did not get  any treatment.  Is the patient at risk to self? Yes.     Has the patient been a risk to self in the past 6 months? Yes.    Has the patient been a risk to self within the distant past? No.  Is the patient a risk to others? Yes.    Has the patient been a risk to others in the past 6 months? Yes.    Has the patient been a risk to others within the distant past? No.   Prior Inpatient Therapy:   Prior Outpatient Therapy:    Alcohol Screening: 1. How often do you have a drink containing alcohol?: Never 9. Have you or someone else been injured as a result of your drinking?: No 10. Has a relative or friend or a doctor or another health worker been concerned about your drinking or suggested you cut down?: No Alcohol Use Disorder Identification Test Final Score (AUDIT): 0 Brief Intervention: AUDIT score less than 7 or less-screening does not suggest unhealthy drinking-brief intervention not indicated Substance Abuse History in the last 12 months:  Yes.   Consequences of Substance Abuse: NA no history of withdrawals Previous Psychotropic Medications: taken Seroquel with good response. Psychological Evaluations: No  Past Medical History:  Past Medical History:  Diagnosis Date  . Anemia   . Bipolar 1 disorder ([place])   . Depression   . GERD (gastroesophageal reflux disease)   . Headache   . Hypertension   . Multiple allergies   . Pulmonary embolism ([place])   . Vaginal Pap smear, abnormal     Past Surgical History:  Procedure Laterality Date  . BREAST SURGERY     had lumped removed at age 64. non cancer  . CESAREAN SECTION     C/S x 1  . CESAREAN SECTION N/A 04/19/2015   Procedure: CESAREAN SECTION;  Surgeon: Woodroe Mode, MD;  Location: [facility];  Service: Obstetrics;  Laterality: N/A;   Family History:  Family History  Problem Relation Age of Onset  . Hypertension Maternal Aunt   . Cancer Maternal Aunt   . Heart disease Maternal Grandmother    Family Psychiatric  History: reviewed. Tobacco Screening: Have you used any form of tobacco in  the last 30 days? (Cigarettes, Smokeless Tobacco, Cigars, and/or Pipes): No Social History:  History  Alcohol Use No     History  Drug Use  . Types: Marijuana    Comment: quit 2015    Additional Social History:                           Allergies:   Allergies  Allergen Reactions  . Garlic Anaphylaxis  . Ibuprofen Anaphylaxis and Hives  . Latex Hives  . Peanut-Containing Drug Products Anaphylaxis and Itching  . Shellfish Allergy Anaphylaxis  . Vinegar [Acetic Acid] Anaphylaxis  . Aspirin Other (See Comments)     Causes blood not to clot.  . Banana Hives  . Benadryl [Diphenhydramine] Other (See Comments)    It "increases" the pts "allergic reaction response.Makes it happen faster." (Pill form)  . Chocolate Other (See Comments)    Causes migraines  . Codeine Hives  . Eggs Or Egg-Derived Products Hives and Swelling  . Iodine Hives  . Penicillins Hives and Itching    Has patient had a PCN reaction causing immediate rash, facial/tongue/throat swelling, SOB or lightheadedness with hypotension: No Has patient had a PCN reaction causing severe rash involving mucus membranes or  skin necrosis: No Has patient had a PCN reaction that required hospitalization: No Has patient had a PCN reaction occurring within the last 10 years: No If all of the above answers are "NO", then may proceed with Cephalosporin use.  . Strawberry Extract Hives  . Tylenol [Acetaminophen] Hives  . Citrus Rash  . Pineapple Rash   Lab Results:  Results for orders placed or performed during the hospital encounter of 05/25/17 (from the past 48 hour(s))  CBC     Status: Abnormal   Collection Time: 05/25/17  5:39 PM  Result Value Ref Range   WBC 6.7 4.0 - 10.5 K/uL   RBC 4.50 3.87 - 5.11 MIL/uL   Hemoglobin 10.5 (L) 12.0 - 15.0 g/dL   HCT 35.0 (L) 36.0 - 46.0 %   MCV 77.8 (L) 78.0 - 100.0 fL   MCH 23.3 (L) 26.0 - 34.0 pg   MCHC 30.0 30.0 - 36.0 g/dL   RDW 18.4 (H) 11.5 - 15.5 %   Platelets 364  150 - 400 K/uL  Basic metabolic panel     Status: None   Collection Time: 05/25/17  5:39 PM  Result Value Ref Range   Sodium 140 135 - 145 mmol/L   Potassium 3.5 3.5 - 5.1 mmol/L   Chloride 110 101 - 111 mmol/L   CO2 22 22 - 32 mmol/L   Glucose, Bld 74 65 - 99 mg/dL   BUN 8 6 - 20 mg/dL   Creatinine, Ser 0.77 0.44 - 1.00 mg/dL   Calcium 9.4 8.9 - 10.3 mg/dL   GFR calc non Af Amer >60 >60 mL/min   GFR calc Af Amer >60 >60 mL/min    Comment: (NOTE) The eGFR has been calculated using the CKD EPI equation. This calculation has not been validated in all clinical situations. eGFR's persistently <60 mL/min signify possible Chronic Kidney Disease.    Anion gap 8 5 - 15  Urinalysis, Routine w reflex microscopic     Status: Abnormal   Collection Time: 05/25/17  5:53 PM  Result Value Ref Range   Color, Urine YELLOW YELLOW   APPearance CLEAR CLEAR   Specific Gravity, Urine >1.030 (H) 1.005 - 1.030   pH 6.0 5.0 - 8.0   Glucose, UA NEGATIVE NEGATIVE mg/dL   Hgb urine dipstick NEGATIVE NEGATIVE   Bilirubin Urine SMALL (A) NEGATIVE   Ketones, ur >80 (A) NEGATIVE mg/dL   Protein, ur NEGATIVE NEGATIVE mg/dL   Nitrite NEGATIVE NEGATIVE   Leukocytes, UA NEGATIVE NEGATIVE    Comment: Microscopic not done on urines with negative protein, blood, leukocytes, nitrite, or glucose < 500 mg/dL.  Rapid urine drug screen (hospital performed)     Status: Abnormal   Collection Time: 05/25/17  6:50 PM  Result Value Ref Range   Opiates NONE DETECTED NONE DETECTED   Cocaine NONE DETECTED NONE DETECTED   Benzodiazepines NONE DETECTED NONE DETECTED   Amphetamines NONE DETECTED NONE DETECTED   Tetrahydrocannabinol POSITIVE (A) NONE DETECTED   Barbiturates NONE DETECTED NONE DETECTED    Comment:        DRUG SCREEN FOR MEDICAL PURPOSES ONLY.  IF CONFIRMATION IS NEEDED FOR ANY PURPOSE, NOTIFY LAB WITHIN 5 DAYS.        LOWEST DETECTABLE LIMITS FOR URINE DRUG SCREEN Drug Class       Cutoff  (ng/mL) Amphetamine      1000 Barbiturate      200 Benzodiazepine   664 Tricyclics       403 Opiates  300 Cocaine          300 THC              50     Blood Alcohol level:  No results found for: [facility]  Metabolic Disorder Labs:  Recent Results (from the past 2160 hour(s))  POC Urine Pregnancy, ED (do NOT order at [facility])     Status: None   Collection Time: 04/17/17  5:38 PM  Result Value Ref Range   Preg Test, Ur NEGATIVE NEGATIVE    Comment:        THE SENSITIVITY OF THIS METHODOLOGY IS >24 mIU/mL   CBC     Status: Abnormal   Collection Time: 05/25/17  5:39 PM  Result Value Ref Range   WBC 6.7 4.0 - 10.5 K/uL   RBC 4.50 3.87 - 5.11 MIL/uL   Hemoglobin 10.5 (L) 12.0 - 15.0 g/dL   HCT 35.0 (L) 36.0 - 46.0 %   MCV 77.8 (L) 78.0 - 100.0 fL   MCH 23.3 (L) 26.0 - 34.0 pg   MCHC 30.0 30.0 - 36.0 g/dL   RDW 18.4 (H) 11.5 - 15.5 %   Platelets 364 150 - 400 K/uL  Basic metabolic panel     Status: None   Collection Time: 05/25/17  5:39 PM  Result Value Ref Range   Sodium 140 135 - 145 mmol/L   Potassium 3.5 3.5 - 5.1 mmol/L   Chloride 110 101 - 111 mmol/L   CO2 22 22 - 32 mmol/L   Glucose, Bld 74 65 - 99 mg/dL   BUN 8 6 - 20 mg/dL   Creatinine, Ser 0.77 0.44 - 1.00 mg/dL   Calcium 9.4 8.9 - 10.3 mg/dL   GFR calc non Af Amer >60 >60 mL/min   GFR calc Af Amer >60 >60 mL/min    Comment: (NOTE) The eGFR has been calculated using the CKD EPI equation. This calculation has not been validated in all clinical situations. eGFR's persistently <60 mL/min signify possible Chronic Kidney Disease.    Anion gap 8 5 - 15  Urinalysis, Routine w reflex microscopic     Status: Abnormal   Collection Time: 05/25/17  5:53 PM  Result Value Ref Range   Color, Urine YELLOW YELLOW   APPearance CLEAR CLEAR   Specific Gravity, Urine >1.030 (H) 1.005 - 1.030   pH 6.0 5.0 - 8.0   Glucose, UA NEGATIVE NEGATIVE mg/dL   Hgb urine dipstick NEGATIVE NEGATIVE   Bilirubin Urine SMALL (A) NEGATIVE    Ketones, ur >80 (A) NEGATIVE mg/dL   Protein, ur NEGATIVE NEGATIVE mg/dL   Nitrite NEGATIVE NEGATIVE   Leukocytes, UA NEGATIVE NEGATIVE    Comment: Microscopic not done on urines with negative protein, blood, leukocytes, nitrite, or glucose < 500 mg/dL.  Rapid urine drug screen (hospital performed)     Status: Abnormal   Collection Time: 05/25/17  6:50 PM  Result Value Ref Range   Opiates NONE DETECTED NONE DETECTED   Cocaine NONE DETECTED NONE DETECTED   Benzodiazepines NONE DETECTED NONE DETECTED   Amphetamines NONE DETECTED NONE DETECTED   Tetrahydrocannabinol POSITIVE (A) NONE DETECTED   Barbiturates NONE DETECTED NONE DETECTED    Comment:        DRUG SCREEN FOR MEDICAL PURPOSES ONLY.  IF CONFIRMATION IS NEEDED FOR ANY PURPOSE, NOTIFY LAB WITHIN 5 DAYS.        LOWEST DETECTABLE LIMITS FOR URINE DRUG SCREEN Drug Class       Cutoff (ng/mL)  Amphetamine      1000 Barbiturate      200 Benzodiazepine   623 Tricyclics       762 Opiates          300 Cocaine          300 THC              50    Lab Results  Component Value Date   HGBA1C  07/25/2010    5.3 (NOTE)                                                                       According to the ADA Clinical Practice Recommendations for 2011, when HbA1c is used as a screening test:   >=6.5%   Diagnostic of Diabetes Mellitus           (if abnormal result  is confirmed)  5.7-6.4%   Increased risk of developing Diabetes Mellitus  References:Diagnosis and Classification of Diabetes Mellitus,Diabetes GBTD,1761,60(VPXTG 1):S62-S69 and Standards of Medical Care in         Diabetes - 2011,Diabetes Care,2011,34  (Suppl 1):S11-S61.   MPG 105 07/25/2010   No results found for: PROLACTIN Lab Results  Component Value Date   CHOL  07/25/2010    153        ATP III CLASSIFICATION:  <200     mg/dL   Desirable  200-239  mg/dL   Borderline High  >=240    mg/dL   High          TRIG 62 07/25/2010   HDL 59 07/25/2010   CHOLHDL 2.6  07/25/2010   VLDL 12 07/25/2010   LDLCALC  07/25/2010    82        Total Cholesterol/HDL:CHD Risk Coronary Heart Disease Risk Table                     Men   Women  1/2 Average Risk   3.4   3.3  Average Risk       5.0   4.4  2 X Average Risk   9.6   7.1  3 X Average Risk  23.4   11.0        Use the calculated Patient Ratio above and the CHD Risk Table to determine the patient's CHD Risk.        ATP III CLASSIFICATION (LDL):  <100     mg/dL   Optimal  100-129  mg/dL   Near or Above                    Optimal  130-159  mg/dL   Borderline  160-189  mg/dL   High  >190     mg/dL   Very High    Current Medications: Current Facility-Administered Medications  Medication Dose Route Frequency Provider Last Rate Last Dose  . famotidine (PEPCID) tablet 20 mg  20 mg Oral BID Okonkwo, Justina A, NP   20 mg at 05/27/17 0739  . feeding supplement (BOOST / RESOURCE BREEZE) liquid 1 Container  1 Container Oral TID BM Cobos, Myer Peer, MD   1 Container at 05/27/17 0957  . hydrOXYzine (ATARAX/VISTARIL) tablet 25 mg  25 mg Oral TID PRN Lu Duffel,  Justina A, NP   25 mg at 05/26/17 2039  . magnesium hydroxide (MILK OF MAGNESIA) suspension 30 mL  30 mL Oral Daily PRN Okonkwo, Justina A, NP      . ondansetron (ZOFRAN) tablet 4 mg  4 mg Oral Q8H PRN Okonkwo, Justina A, NP      . predniSONE (DELTASONE) tablet 40 mg  40 mg Oral Q breakfast Okonkwo, Justina A, NP   40 mg at 05/27/17 0739  . QUEtiapine (SEROQUEL) tablet 50 mg  50 mg Oral QHS Hanzel Pizzo T, MD      . traZODone (DESYREL) tablet 50 mg  50 mg Oral QHS PRN Lu Duffel, Justina A, NP   50 mg at 05/26/17 2232   PTA Medications: Prescriptions Prior to Admission  Medication Sig Dispense Refill Last Dose  . enalapril (VASOTEC) 20 MG tablet Take 1 tablet (20 mg total) by mouth daily. (Patient not taking: Reported on 05/25/2017) 30 tablet 3 Not Taking at Unknown time  . warfarin (COUMADIN) 5 MG tablet Take 1.5 tablets (7.5 mg total) by mouth daily at 6  PM. (Patient not taking: Reported on 05/25/2017) 30 tablet 3 Not Taking at Unknown time    Musculoskeletal: Strength & Muscle Tone: within normal limits Gait & Station: normal Patient leans: N/A  Psychiatric Specialty Exam: Physical Exam  ROS  Blood pressure 123/87, pulse 70, temperature 98.1 F (36.7 C), temperature source Oral, resp. rate 20, height 5' 10.47" (1.79 m), weight 96.2 kg (212 lb), currently breastfeeding.Body mass index is 30.01 kg/m.  General Appearance: Fairly Groomed and Guarded  Eye Contact:  Fair  Speech:  Slow  Volume:  Normal  Mood:  Anxious, Depressed, Dysphoric and Hopeless  Affect:  Constricted and Depressed  Thought Process:  Goal Directed  Orientation:  Full (Time, Place, and Person)  Thought Content:  Delusions, Hallucinations: Auditory Visual, Paranoid Ideation and Rumination  Suicidal Thoughts:  Yes.  without intent/plan  Homicidal Thoughts:  Yes.  without intent/plan  Memory:  Immediate;   Fair Recent;   Fair Remote;   Fair  Judgement:  Fair  Insight:  Fair  Psychomotor Activity:  Decreased  Concentration:  Concentration: Fair and Attention Span: Fair  Recall:  [employer] of Knowledge:  Fair  Language:  Good  Akathisia:  No  Handed:  Right  AIMS (if indicated):     Assets:  Communication Skills  ADL's:  Intact  Cognition:  WNL  Sleep:  Number of Hours: 5.75    Treatment Plan Summary: Admit for crisis management and stabilization. Medication management to reduce symptoms to baseline and improved the patient's overall level of functioning.  Start Seroquel 50 mg at bedtime. Continue trazodone 50 mg at bedtime to help insomnia. Closely monitor the side effects, efficacy and therapeutic response of medication. Treat health problem as indicated.patient is no longer taking Coumadin and antihypertensive medication however we will continue to monitor her blood pressure closely. Developed treatment plan to decrease the risk of relapse upon  discharge and to reduce the need for readmission. Psychosocial education regarding relapse prevention in self-care. Healthcare followup as needed for medical problems and called consults as indicated.   Increase collateral information. Restart home medication where appropriate Encouraged to participate and verbalize into group milieu therapy. Review blood work results.  Pregnancy test negative.  UDS shows positive for cannabis. Basic chemistry normal.  CBC shows hemoglobin 10.5 and hematocrit 35.  We will do hemoglobin A1c.  Observation Level/Precautions:  15 minute checks  Laboratory:  Chemistry  Profile HbAIC HCG UDS  Psychotherapy:    Medications:    Consultations:    Discharge Concerns:    Estimated LOS:  Other:     Physician Treatment Plan for Primary Diagnosis: <principal problem not specified> Long Term Goal(s): Improvement in symptoms so as ready for discharge  Short Term Goals: Ability to verbalize feelings will improve, Ability to disclose and discuss suicidal ideas, Ability to demonstrate self-control will improve, Ability to identify and develop effective coping behaviors will improve, Ability to maintain clinical measurements within normal limits will improve and Compliance with prescribed medications will improve  Physician Treatment Plan for Secondary Diagnosis: Active Problems:   Bipolar 1 disorder, depressed, severe ([place])  Long Term Goal(s): Improvement in symptoms so as ready for discharge  Short Term Goals: Ability to verbalize feelings will improve, Ability to disclose and discuss suicidal ideas and Ability to identify triggers associated with substance abuse/mental health issues will improve  I certify that inpatient services furnished can reasonably be expected to improve the patient's condition.    Santia Labate T., MD 6/16/201810:48 AM

## 2017-05-27 NOTE — Progress Notes (Signed)
Adult Psychoeducational Group Note  Date:  05/27/2017 Time:  9:19 PM  Group Topic/Focus:  Wrap-Up Group:   The focus of this group is to help patients review their daily goal of treatment and discuss progress on daily workbooks.  Participation Level:  Active  Participation Quality:  Appropriate  Affect:  Appropriate  Cognitive:  Appropriate  Insight: Appropriate  Engagement in Group:  Engaged  Modes of Intervention:  Socialization and Support  Additional Comments:  Patient attended and participated in group tonight. She reports that today she went for her meals, she socialized and learn some new games. Has a coping still she want to go back to talking. It has helped her in the past so she has revert back to talking someone she believes has the ability to understand her situation and will not encourage any negative thoughts she maybe having.  Lita MainsFrancis, Rajan Burgard [facility] 05/27/2017, 9:19 PM

## 2017-05-28 MED ORDER — QUETIAPINE FUMARATE 100 MG PO TABS
100.0000 mg | ORAL_TABLET | Freq: Every day | ORAL | Status: DC
  Administered 2017-05-28: 100 mg via ORAL
  Filled 2017-05-28 (×2): qty 1

## 2017-05-28 NOTE — Progress Notes (Signed)
[facility] MD Progress Note  05/28/2017 12:43 PM Danielle Estes  MRN:  [id number] Subjective:  I still feel very anxious.  I could not sleep last night. Objective; patient seen chart reviewed.  Patient remains very anxious, depressed and easily tearful.  She did not sleep last night.  However she is taking the medication and reported no side effects.  She is going to the groups but limited participation.  She continues to feel very anxious depressed and having suicidal thoughts.  However she denies any homicidal thoughts.  Her energy level is low.  She is sleeping not good but her appetite is okay.  Principal Problem: Bipolar disorder (HCC) Diagnosis:   Patient Active Problem List   Diagnosis Date Noted  . Bipolar 1 disorder, depressed, severe (HCC) [F31.4] 05/26/2017  . Pulmonary embolism on right (HCC) [I26.99] 05/10/2015  . Cholelithiasis without cholecystitis [K80.20] 05/10/2015  . Postoperative anemia due to acute blood loss [D62] 05/10/2015  . Pre-eclampsia superimposed on chronic hypertension, antepartum [O11.9, O10.019] 04/18/2015  . Chronic hypertension [I10]   . [name], fetal, affecting care of mother, antepartum [O35.0XX0]   . Small for dates affecting management of mother [O36.5990]   . Supervision of high risk pregnancy in third trimester [O09.93] 01/07/2015  . Bipolar disorder (HCC) [F31.9] 01/07/2015  . H/O: C-section [W09.811] 01/07/2015  . Essential hypertension antepartum [O10.019]   . AMA (advanced maternal age) multigravida 35+ [O39.529]    Total Time spent with patient: 20 minutes  Past Psychiatric History: Reviewed.  Past Medical History:  Past Medical History:  Diagnosis Date  . Anemia   . Bipolar 1 disorder (HCC)   . Depression   . GERD (gastroesophageal reflux disease)   . Headache   . Hypertension   . Multiple allergies   . Pulmonary embolism (HCC)   . Vaginal Pap smear, abnormal     Past Surgical History:  Procedure Laterality Date   . BREAST SURGERY     had lumped removed at age 44. non cancer  . CESAREAN SECTION     C/S x 1  . CESAREAN SECTION N/A 04/19/2015   Procedure: CESAREAN SECTION;  Surgeon: Adam Phenix, MD;  Location: [facility];  Service: Obstetrics;  Laterality: N/A;   Family History:  Family History  Problem Relation Age of Onset  . Hypertension Maternal Aunt   . Cancer Maternal Aunt   . Heart disease Maternal Grandmother    Family Psychiatric  History: reviewed. Social History:  History  Alcohol Use No     History  Drug Use  . Types: Marijuana    Comment: quit 2015    Social History   Social History  . Marital status: Married    Spouse name: N/A  . Number of children: N/A  . Years of education: N/A   Social History Main Topics  . Smoking status: Former Games developer  . Smokeless tobacco: Never Used  . Alcohol use No  . Drug use: Yes    Types: Marijuana     Comment: quit 2015  . Sexual activity: Yes    Birth control/ protection: None   Other Topics Concern  . None   Social History Narrative  . None   Additional Social History:                         Sleep: Poor  Appetite:  Fair  Current Medications: Current Facility-Administered Medications  Medication Dose Route Frequency Provider Last Rate Last Dose  .  famotidine (PEPCID) tablet 20 mg  20 mg Oral BID Okonkwo, Justina A, NP   20 mg at 05/28/17 0744  . feeding supplement (BOOST / RESOURCE BREEZE) liquid 1 Container  1 Container Oral TID BM Cobos, Rockey SituFernando A, MD   1 Container at 05/27/17 1400  . hydrOXYzine (ATARAX/VISTARIL) tablet 25 mg  25 mg Oral TID PRN Beryle Lathekonkwo, Justina A, NP   25 mg at 05/27/17 2310  . magnesium hydroxide (MILK OF MAGNESIA) suspension 30 mL  30 mL Oral Daily PRN Okonkwo, Justina A, NP      . ondansetron (ZOFRAN) tablet 4 mg  4 mg Oral Q8H PRN Okonkwo, Justina A, NP      . predniSONE (DELTASONE) tablet 40 mg  40 mg Oral Q breakfast Okonkwo, Justina A, NP   40 mg at 05/28/17 0744  . QUEtiapine  (SEROQUEL) tablet 50 mg  50 mg Oral QHS Cleotis NipperArfeen, Myrl Lazarus T, MD   50 mg at 05/27/17 2119  . traZODone (DESYREL) tablet 50 mg  50 mg Oral QHS PRN Okonkwo, Justina A, NP   50 mg at 05/27/17 2309    Lab Results: No results found for this or any previous visit (from the past 48 hour(s)).  Blood Alcohol level:  No results found for: [facility]  Metabolic Disorder Labs: Lab Results  Component Value Date   HGBA1C  07/25/2010    5.3 (NOTE)                                                                       According to the ADA Clinical Practice Recommendations for 2011, when HbA1c is used as a screening test:   >=6.5%   Diagnostic of Diabetes Mellitus           (if abnormal result  is confirmed)  5.7-6.4%   Increased risk of developing Diabetes Mellitus  References:Diagnosis and Classification of Diabetes Mellitus,Diabetes Care,2011,34(Suppl 1):S62-S69 and Standards of Medical Care in         Diabetes - 2011,Diabetes Care,2011,34  (Suppl 1):S11-S61.   MPG 105 07/25/2010   No results found for: PROLACTIN Lab Results  Component Value Date   CHOL  07/25/2010    153        ATP III CLASSIFICATION:  <200     mg/dL   Desirable  161-096200-239  mg/dL   Borderline High  >=045>=240    mg/dL   High          TRIG 62 07/25/2010   HDL 59 07/25/2010   CHOLHDL 2.6 07/25/2010   VLDL 12 07/25/2010   LDLCALC  07/25/2010    82        Total Cholesterol/HDL:CHD Risk Coronary Heart Disease Risk Table                     Men   Women  1/2 Average Risk   3.4   3.3  Average Risk       5.0   4.4  2 X Average Risk   9.6   7.1  3 X Average Risk  23.4   11.0        Use the calculated Patient Ratio above and the CHD Risk Table to determine the patient's CHD Risk.  ATP III CLASSIFICATION (LDL):  <100     mg/dL   Optimal  161-096  mg/dL   Near or Above                    Optimal  130-159  mg/dL   Borderline  045-409  mg/dL   High  >811     mg/dL   Very High    Physical Findings: AIMS: Facial and Oral  Movements Muscles of Facial Expression: None, normal Lips and Perioral Area: None, normal Jaw: None, normal Tongue: None, normal,Extremity Movements Upper (arms, wrists, hands, fingers): None, normal Lower (legs, knees, ankles, toes): None, normal, Trunk Movements Neck, shoulders, hips: None, normal, Overall Severity Severity of abnormal movements (highest score from questions above): None, normal Incapacitation due to abnormal movements: None, normal Patient's awareness of abnormal movements (rate only patient's report): No Awareness, Dental Status Current problems with teeth and/or dentures?: No Does patient usually wear dentures?: No  CIWA:    COWS:     Musculoskeletal: Strength & Muscle Tone: within normal limits Gait & Station: normal Patient leans: N/A  Psychiatric Specialty Exam: Physical Exam  Review of Systems  Respiratory: Negative.   Cardiovascular: Negative.   Gastrointestinal: Negative.   Genitourinary: Negative.   Musculoskeletal: Negative.   Neurological: Negative.   Psychiatric/Behavioral: Positive for depression, hallucinations and suicidal ideas. The patient is nervous/anxious.     Blood pressure (!) 137/96, pulse 90, temperature 97.5 F (36.4 C), temperature source Oral, resp. rate 20, height 5' 10.47" (1.79 m), weight 96.2 kg (212 lb), currently breastfeeding.Body mass index is 30.01 kg/m.  General Appearance: Casual  Eye Contact:  Fair  Speech:  Clear and Coherent  Volume:  Normal  Mood:  Anxious, Depressed and Dysphoric  Affect:  Constricted and Depressed  Thought Process:  Goal Directed  Orientation:  Full (Time, Place, and Person)  Thought Content:  Paranoid Ideation and Rumination  Suicidal Thoughts:  Yes.  without intent/plan  Homicidal Thoughts:  No  Memory:  Immediate;   Fair Recent;   Fair Remote;   Fair  Judgement:  Fair  Insight:  Good  Psychomotor Activity:  Decreased  Concentration:  Concentration: Fair and Attention Span: Fair   Recall:  Good  Fund of Knowledge:  Good  Language:  Fair  Akathisia:  No  Handed:  Right  AIMS (if indicated):     Assets:  Communication Skills Desire for Improvement  ADL's:  Intact  Cognition:  WNL  Sleep:  Number of Hours: 5     Treatment Plan Summary: Daily contact with patient to assess and evaluate symptoms and progress in treatment and Medication management  Increase Seroquel 100 mg at bedtime to help hallucination and insomnia. Continue trazodone 50 mg at bedtime. Continue Vistaril 25 mg for anxiety. Encouraged to participate in group milieu therapy. Hemoglobin A1c is pending.  Reviewed all other blood work results.  Patient has no tremors shakes or any EPS.  Social worker to start discharge planning.  Bethanny Toelle T., MD 05/28/2017, 12:43 PM

## 2017-05-28 NOTE — [facility] Group Notes (Signed)
[facility] LCSW Group Therapy  05/28/2017 11 AM  Type of Therapy:  Group Therapy  Participation Level:  Active  Participation Quality:  Attentive and Sharing  Affect:  Excited  Cognitive:  Alert and Oriented  Insight:  Developing/Improving  Engagement in Therapy:  Developing/Improving  Modes of Intervention:  Discussion, Education, Exploration, Problem-solving, Socialization and Support  Summary of Progress/Problems: Topic for today was thoughts and feelings regarding discharge. We discussed fears of upcoming changes including judgements, expectations and stigma of mental health issues. We then discussed supports: what constitutes a supportive framework, identification of supports and what to do when others are not supportive. Patients then discussed a specific coping tool to use when others are not available. Patient processed importance of being honest with supports.   Danielle Bernatherine C Kaytlin Burklow, LCSW
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
## 2017-05-28 NOTE — Plan of Care (Signed)
Problem: Activity: Goal: Interest or engagement in activities will improve Outcome: Progressing Actively seeks out therapeutic tasks such as daily packet, mindful coloring; attends all groups and activities. States "I like the process, I'm glad I'm inpatient" Goal: Sleeping patterns will improve Outcome: Not Progressing Reports poor sleep last night  Problem: Education: Goal: Knowledge of [place] Education information/materials will improve Outcome: Completed/Met Date Met: 05/28/17 General education materials and hospital information verbalized Goal: Emotional status will improve Outcome: Progressing Reports feeling calmer, practicing coping skills for anxiety and feelings of being overwhelmed  Problem: Coping: Goal: Ability to demonstrate self-control will improve Outcome: Progressing No evidence of behavioral dyscontrol  Problem: Health Behavior/Discharge Planning: Goal: Identification of resources available to assist in meeting health care needs will improve Outcome: Progressing Discussed ability to access both the [place] and other community resources. Goal: Compliance with treatment plan for underlying cause of condition will improve Outcome: Progressing Engaged in treatment, compliant with medications and learning about medications  Problem: Coping: Goal: Ability to cope will improve Outcome: Progressing Actively practicing coping skills - talking to staff, mindful coloring, walking away from stressful situations.   Problem: Self-Concept: Goal: Level of anxiety will decrease Outcome: Progressing Continues to report anxiety

## 2017-05-29 DIAGNOSIS — F515 Nightmare disorder: Secondary | ICD-10-CM

## 2017-05-29 DIAGNOSIS — F3164 Bipolar disorder, current episode mixed, severe, with psychotic features: Principal | ICD-10-CM

## 2017-05-29 DIAGNOSIS — Z87891 Personal history of nicotine dependence: Secondary | ICD-10-CM

## 2017-05-29 DIAGNOSIS — F4310 Post-traumatic stress disorder, unspecified: Secondary | ICD-10-CM | POA: Diagnosis present

## 2017-05-29 LAB — HEMOGLOBIN A1C
Hgb A1c MFr Bld: 5.4 % (ref 4.8–5.6)
Mean Plasma Glucose: 108 mg/dL

## 2017-05-29 MED ORDER — CLONAZEPAM 0.5 MG PO TABS
0.2500 mg | ORAL_TABLET | Freq: Three times a day (TID) | ORAL | Status: DC
  Administered 2017-05-29 – 2017-05-30 (×4): 0.25 mg via ORAL
  Filled 2017-05-29 (×4): qty 1

## 2017-05-29 MED ORDER — NON FORMULARY: 2.0000 mg | Freq: Every day | Status: DC

## 2017-05-29 MED ORDER — ESZOPICLONE 2 MG PO TABS
2.0000 mg | ORAL_TABLET | Freq: Every day | ORAL | Status: DC
  Administered 2017-05-29 – 2017-06-01 (×4): 2 mg via ORAL
  Filled 2017-05-29 (×4): qty 1

## 2017-05-29 MED ORDER — TRIAMCINOLONE ACETONIDE 0.5 % EX CREA
TOPICAL_CREAM | Freq: Three times a day (TID) | CUTANEOUS | Status: DC
  Filled 2017-05-29: qty 15

## 2017-05-29 MED ORDER — PRAZOSIN HCL 1 MG PO CAPS
1.0000 mg | ORAL_CAPSULE | Freq: Every day | ORAL | Status: DC
  Administered 2017-05-29: 1 mg via ORAL
  Filled 2017-05-29 (×4): qty 1

## 2017-05-29 MED ORDER — DIVALPROEX SODIUM 250 MG PO DR TAB
250.0000 mg | DELAYED_RELEASE_TABLET | ORAL | Status: DC
  Administered 2017-05-29 – 2017-06-02 (×12): 250 mg via ORAL
  Filled 2017-05-29 (×18): qty 1

## 2017-05-29 MED ORDER — OLANZAPINE 5 MG PO TABS: 5.0000 mg | ORAL_TABLET | ORAL | Status: DC

## 2017-05-29 MED ORDER — POLYETHYLENE GLYCOL 3350 17 G PO PACK
17.0000 g | PACK | Freq: Every day | ORAL | Status: DC
  Administered 2017-05-29 – 2017-05-30 (×2): 17 g via ORAL
  Filled 2017-05-29 (×7): qty 1

## 2017-05-29 MED ORDER — ASENAPINE MALEATE 5 MG SL SUBL: 5.0000 mg | SUBLINGUAL_TABLET | Freq: Two times a day (BID) | SUBLINGUAL | Status: DC | PRN

## 2017-05-29 MED ORDER — FERROUS SULFATE 325 (65 FE) MG PO TABS
325.0000 mg | ORAL_TABLET | Freq: Two times a day (BID) | ORAL | Status: DC
  Administered 2017-05-31 – 2017-06-02 (×5): 325 mg via ORAL
  Filled 2017-05-29 (×12): qty 1

## 2017-05-29 MED ORDER — OLANZAPINE 5 MG PO TABS
5.0000 mg | ORAL_TABLET | ORAL | Status: DC
  Administered 2017-05-29 – 2017-06-02 (×13): 5 mg via ORAL
  Filled 2017-05-29 (×2): qty 1
  Filled 2017-05-29: qty 2
  Filled 2017-05-29 (×17): qty 1

## 2017-05-29 NOTE — Progress Notes (Signed)
Patient ID: Danielle Estes, female   DOB: [birth year], 36 y.o.   MRN: [id number]  Patient's BP elevated today. MD Eappen is aware and reports to continue monitoring patient for s/s of elevated BP. Patient is aware of her bedtime scheduled Minipress as well. At this time, other than some anxiety and agitation this morning she is asymptomatic.

## 2017-05-29 NOTE — BH Assessment (Signed)
D: Patient active on unit. Cheerful, cooperative and pleasant. Patient stated "I am trying to be better for my kids". Seen interacting well with peers and staff. Denies pain, SI/HI, AH/VH at this time. No behavior issues noted.   A: Staff offered support and encouragement as needed. Due med give as ordered. Routine safety checks maintained. Will continue to monitor patient.   R: Patient receptive to nursing interventions and treatments.

## 2017-05-29 NOTE — Tx Team (Signed)
Interdisciplinary Treatment and Diagnostic Plan Update  05/29/2017 Time of Session: 9:00 AM Danielle Estes MRN: [id number]  Principal Diagnosis: Bipolar disorder [facility](HCC)  Secondary Diagnoses: Principal Problem:   Bipolar disorder (HCC) Active Problems:   Bipolar 1 disorder, depressed, severe (HCC)   Current Medications:  Current Facility-Administered Medications  Medication Dose Route Frequency Provider Last Rate Last Dose  . asenapine (SAPHRIS) sublingual tablet 5 mg  5 mg Sublingual BID PRN Eappen, Levin BaconSaramma, MD      . clonazePAM (KLONOPIN) tablet 0.25 mg  0.25 mg Oral TID Jomarie LongsEappen, Saramma, MD      . divalproex (DEPAKOTE) DR tablet 250 mg  250 mg Oral [id number] Eappen, Saramma, MD      . eszopiclone (LUNESTA) tablet 2 mg  2 mg Oral QHS Cobos, Fernando A, MD      . famotidine (PEPCID) tablet 20 mg  20 mg Oral BID Okonkwo, Justina A, NP   20 mg at 05/29/17 0829  . ferrous sulfate tablet 325 mg  325 mg Oral BID WC Eappen, Saramma, MD      . hydrOXYzine (ATARAX/VISTARIL) tablet 25 mg  25 mg Oral TID PRN Beryle Lathekonkwo, Justina A, NP   25 mg at 05/27/17 2310  . magnesium hydroxide (MILK OF MAGNESIA) suspension 30 mL  30 mL Oral Daily PRN Okonkwo, Justina A, NP      . OLANZapine (ZYPREXA) tablet 5 mg  5 mg Oral [id number] Eappen, Saramma, MD   5 mg at 05/29/17 1101  . ondansetron (ZOFRAN) tablet 4 mg  4 mg Oral Q8H PRN Okonkwo, Justina A, NP      . polyethylene glycol (MIRALAX / GLYCOLAX) packet 17 g  17 g Oral Daily Eappen, Saramma, MD      . predniSONE (DELTASONE) tablet 40 mg  40 mg Oral Q breakfast Okonkwo, Justina A, NP   40 mg at 05/29/17 0826  . traZODone (DESYREL) tablet 50 mg  50 mg Oral QHS PRN Okonkwo, Justina A, NP   50 mg at 05/27/17 2309  . triamcinolone cream (KENALOG) 0.5 %   Topical TID Jomarie LongsEappen, Saramma, MD       PTA Medications: Prescriptions Prior to Admission  Medication Sig Dispense Refill Last Dose  . enalapril (VASOTEC) 20 MG tablet Take 1 tablet (20 mg total) by mouth  daily. (Patient not taking: Reported on 05/25/2017) 30 tablet 3 Not Taking at Unknown time  . warfarin (COUMADIN) 5 MG tablet Take 1.5 tablets (7.5 mg total) by mouth daily at 6 PM. (Patient not taking: Reported on 05/25/2017) 30 tablet 3 Not Taking at Unknown time    Patient Stressors: Financial difficulties Medication change or noncompliance Occupational concerns  Patient Strengths: Ability for insight Average or above average intelligence Communication skills Motivation for treatment/growth Supportive family/friends  Treatment Modalities: Medication Management, Group therapy, Case management,  1 to 1 session with clinician, Psychoeducation, Recreational therapy.   Physician Treatment Plan for Primary Diagnosis: Bipolar disorder (HCC) Long Term Goal(s): Improvement in symptoms so as ready for discharge Improvement in symptoms so as ready for discharge   Short Term Goals: Ability to verbalize feelings will improve Ability to disclose and discuss suicidal ideas Ability to demonstrate self-control will improve Ability to identify and develop effective coping behaviors will improve Ability to maintain clinical measurements within normal limits will improve Compliance with prescribed medications will improve Ability to verbalize feelings will improve Ability to disclose and discuss suicidal ideas Ability to identify triggers associated with substance abuse/mental health issues will improve  Medication  Management: Evaluate patient's response, side effects, and tolerance of medication regimen.  Therapeutic Interventions: 1 to 1 sessions, Unit Group sessions and Medication administration.  Evaluation of Outcomes: Progressing  Physician Treatment Plan for Secondary Diagnosis: Principal Problem:   Bipolar disorder (HCC) Active Problems:   Bipolar 1 disorder, depressed, severe (HCC)  Long Term Goal(s): Improvement in symptoms so as ready for discharge Improvement in symptoms so as  ready for discharge   Short Term Goals: Ability to verbalize feelings will improve Ability to disclose and discuss suicidal ideas Ability to demonstrate self-control will improve Ability to identify and develop effective coping behaviors will improve Ability to maintain clinical measurements within normal limits will improve Compliance with prescribed medications will improve Ability to verbalize feelings will improve Ability to disclose and discuss suicidal ideas Ability to identify triggers associated with substance abuse/mental health issues will improve     Medication Management: Evaluate patient's response, side effects, and tolerance of medication regimen.  Therapeutic Interventions: 1 to 1 sessions, Unit Group sessions and Medication administration.  Evaluation of Outcomes: Progressing   RN Treatment Plan for Primary Diagnosis: Bipolar disorder (HCC) Long Term Goal(s): Knowledge of disease and therapeutic regimen to maintain health will improve  Short Term Goals: Ability to remain free from injury will improve, Ability to verbalize feelings will improve, Ability to disclose and discuss suicidal ideas and Compliance with prescribed medications will improve  Medication Management: RN will administer medications as ordered by provider, will assess and evaluate patient's response and provide education to patient for prescribed medication. RN will report any adverse and/or side effects to prescribing provider.  Therapeutic Interventions: 1 on 1 counseling sessions, Psychoeducation, Medication administration, Evaluate responses to treatment, Monitor vital signs and CBGs as ordered, Perform/monitor CIWA, COWS, AIMS and Fall Risk screenings as ordered, Perform wound care treatments as ordered.  Evaluation of Outcomes: Progressing   LCSW Treatment Plan for Primary Diagnosis: Bipolar disorder (HCC) Long Term Goal(s): Safe transition to appropriate next level of care at discharge, Engage  patient in therapeutic group addressing interpersonal concerns.  Short Term Goals: Engage patient in aftercare planning with referrals and resources, Increase social support and Increase ability to appropriately verbalize feelings  Therapeutic Interventions: Assess for all discharge needs, 1 to 1 time with Social worker, Explore available resources and support systems, Assess for adequacy in community support network, Educate family and significant other(s) on suicide prevention, Complete Psychosocial Assessment, Interpersonal group therapy.  Evaluation of Outcomes: Progressing   Progress in Treatment: Attending groups: Yes. Participating in groups: Yes. Taking medication as prescribed: Yes. Toleration medication: Yes. Family/Significant other contact made: No, will contact:  CSW assessing appropriate contact. Patient understands diagnosis: Yes. Discussing patient identified problems/goals with staff: Yes. Medical problems stabilized or resolved: Yes. Denies suicidal/homicidal ideation: Yes. Issues/concerns per patient self-inventory: No.  New problem(s) identified: No, Describe:  None identified.  New Short Term/Long Term Goal(s): Patient stated goal is to get on the right medications.  Discharge Plan or Barriers: CSW assessing appropriate discharge plan.  Reason for Continuation of Hospitalization: Depression Hallucinations Homicidal ideation Suicidal ideation  Estimated Length of Stay: 2-4 days   Attendees: Patient: Danielle Estes 05/29/2017 11:41 AM  Physician: Dr. Elna Breslow, MD 05/29/2017 11:41 AM  Nursing: Marzetta Board, RN  05/29/2017 11:41 AM  RN Care Manager: 05/29/2017 11:41 AM  Social Worker: Hampton Abbot, MSW, LCSW-A 05/29/2017 11:41 AM  Recreational Therapist:  05/29/2017 11:41 AM  Other:  05/29/2017 11:41 AM  Other:  05/29/2017 11:41 AM  Other: 05/29/2017 11:41  AM    Scribe for Treatment Team: Lynden Oxford, Theresia Majors 05/29/2017 11:41 AM

## 2017-05-29 NOTE — Progress Notes (Signed)
Patient in bed at the beginning of the shift. She reported that her day started off well bu she is doing better now. She said her medication help tune the voices down " They are there but not as loud as they were before". She denied SI/HI and denied Hallucinations. Writer encouraged and supported patient. Q 15 minute check continues as ordered for safety.

## 2017-05-29 NOTE — Social Work (Signed)
Referred to [facility], is [place] resident.  Chanc Kervin, LCSW Lead Clinical Social Worker Phone:  [phone]  

## 2017-05-29 NOTE — [facility] Group Notes (Signed)
[facility] LCSW Group Therapy   05/29/2017 1pm Type of Therapy: Group Therapy   Participation Level: Active   Participation Quality: Attentive, Sharing and Supportive   Affect: Appropriate   Cognitive: Alert and Oriented   Insight: Developing/Improving and Engaged   Engagement in Therapy: Developing/Improving and Engaged   Modes of Intervention: Clarification, Confrontation, Discussion, Education, Exploration,  Limit-setting, Orientation, Problem-solving, Rapport Building, Dance movement psychotherapisteality Testing, Socialization and Support   Summary of Progress/Problems: Pt identified obstacles faced currently and processed barriers involved in overcoming these obstacles. Pt identified steps necessary for overcoming these obstacles and explored motivation (internal and external) for facing these difficulties head on. Pt further identified one area of concern in their lives and chose a goal to focus on for today.    Danielle AbbotKadijah Kaena Estes, MSW, LCSW-A 05/29/2017, 2:24PM

## 2017-05-29 NOTE — Progress Notes (Signed)
Patient ID: Danielle Estes, female   DOB: 19-Apr-1981, 36 y.o.   MRN: [id number]  DAR: Pt. Denies SI/HI and A/V Hallucinations. She reports sleep is fair, appetite is fair, energy level is low, and concentration is poor. She rates depression 10/10, hopelessness 8/10, and anxiety 9/10. Patient does not report any pain at this time. Support and encouragement provided to the patient. Scheduled medications administered to patient per physician's orders. Patient is seen in the milieu interacting with peers and staff. Initially this morning patient is irritable, angry, and guarded. She reports, "I'm not going to take Seroquel. I'm ready to go home." She reports that she came here for help and does feel that she is getting any because she has not been sleeping well and is having racing thoughts. However, after treatment team met with patient and patient spoke with MD Eappen, patient is no longer irritable or angry. She apologized for her hostility and reports she is never in a bad mood she just felt like she was wasting her time. Patient is now cooperative, pleasant, and willing to participate in her plan of care. She is visible in the milieu. Q15 minute checks are maintained for safety.

## 2017-05-29 NOTE — Progress Notes (Signed)
Recreation Therapy Notes  Date: 05/29/17 Time: 1000 Location: 500 Hall Dayroom  Group Topic: Coping Skills  Goal Area(s) Addresses:  Patients will be able to identify positive coping skills. Patients will be able to identify the benefits of using positive coping skills. Patients will be able to identify the benefits of using coping skills post d/c.  Behavioral Response: None  Intervention: OrthoptistWeb design, pencils  Activity: OrthoptistWeb Design.  Patients were given a worksheet of a blank spider web.  Patients were to identify the things that have gotten them stuck and brought them into the hospital.  Patients were to then identify coping skills they can use to help deal with these situations and put them outside the web.  Education: PharmacologistCoping Skills, Building control surveyorDischarge Planning.   Education Outcome: Acknowledges understanding/In group clarification offered/Needs additional education.   Clinical Observations/Feedback: Pt was quiet and tearful.  Pt left early to meet with doctor and returned during processing.   Caroll RancherMarjette Rosendo Couser, LRT/CTRS         Caroll RancherLindsay, Sabatino Williard A 05/29/2017 11:48 AM

## 2017-05-29 NOTE — Progress Notes (Signed)
Adult Psychoeducational Group Note  Date:  05/29/2017 Time:  1:13 AM  Group Topic/Focus:  Wrap-Up Group:   The focus of this group is to help patients review their daily goal of treatment and discuss progress on daily workbooks.  Participation Level:  Active  Participation Quality:  Appropriate  Affect:  Appropriate  Cognitive:  Alert and Appropriate  Insight: Appropriate and Good  Engagement in Group:  Engaged  Modes of Intervention:  Discussion  Additional Comments:  Patient stated her goal for today was to take responsibility of her actions. Patient stated she felt happy when she achieved her goal for today. Patient rated her day a 6 out of 10. Patient stated something positive that happened today was she recognized when she was out of line and apologized to staff for her behavior. Patient stated the goal for tomorrow was to not allow negative thoughts to enter into her mind.  Danielle FurnaceChristopher  [name] 05/29/2017, 1:13 AM

## 2017-05-29 NOTE — Plan of Care (Signed)
Problem: Coping: Goal: Ability to verbalize frustrations and anger appropriately will improve Outcome: Progressing This morning patient was guarded and irritable. She was not physically or verbally abusive however patient was obviously irritated. After MD Eappen spoke with her about her medications patient was able to apologize and is cooperative with staff and peers.
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
## 2017-05-29 NOTE — Progress Notes (Signed)
NUTRITION ASSESSMENT  RD consulted for nutritional assessment. Pt with many food intolerances and allergies.   INTERVENTION: 1. Supplements: d/c Boost Breeze as pt was having a reaction per staff  NUTRITION DIAGNOSIS: Unintentional weight loss related to sub-optimal intake as evidenced by pt report.   Goal: Pt to meet >/= 90% of their estimated nutrition needs.  Monitor:  PO intake  Assessment:  Pt admitted with depression and anxiety. Per chart, pt with food allergies/intolerances to garlic, peanuts, eggs, bananas, shellfish, vinegar, chocolate, strawberry extract, citrus, and pineapple. Per staff, pt showed signs of a reaction when drinking Boost Breeze. RD discontinued order. No supplements on our formulary will be appropriate for patient given allergies.  Kitchen should be providing appropriate food items for patient.   Per chart, last weight recorded from  2016.   Height: Ht Readings from Last 1 Encounters:  05/26/17 5' 10.47" (1.79 m)    Weight: Wt Readings from Last 1 Encounters:  05/26/17 212 lb (96.2 kg)    Weight Hx: Wt Readings from Last 10 Encounters:  05/26/17 212 lb (96.2 kg)  06/24/15 219 lb 6.4 oz (99.5 kg)  05/13/15 220 lb (99.8 kg)  05/10/15 220 lb 3.8 oz (99.9 kg)  04/28/15 225 lb (102.1 kg)  04/18/15 257 lb 8 oz (116.8 kg)  04/16/15 257 lb 8 oz (116.8 kg)  04/09/15 252 lb (114.3 kg)  03/26/15 253 lb 6.4 oz (114.9 kg)  03/06/15 252 lb 4 oz (114.4 kg)    BMI:  Body mass index is 30.01 kg/m. Pt meets criteria for obesity based on current BMI.  Estimated Nutritional Needs: Kcal: 25-30 kcal/kg Protein: > 1 gram protein/kg Fluid: 1 ml/kcal  Diet Order: Diet regular Room service appropriate? Yes; Fluid consistency: Thin Pt is also offered choice of unit snacks mid-morning and mid-afternoon.  Pt is eating as desired.   Lab results and medications reviewed.   Tilda FrancoLindsey Deundra Bard, MS, RD, LDN Pager: [phone] After Hours Pager: [phone]

## 2017-05-29 NOTE — Progress Notes (Signed)
Adult Psychoeducational Group Note  Date:  05/29/2017 Time:  8:52 PM  Group Topic/Focus:  Wrap-Up Group:   The focus of this group is to help patients review their daily goal of treatment and discuss progress on daily workbooks.  Participation Level:  Active  Participation Quality:  Appropriate  Affect:  Appropriate  Cognitive:  Alert  Insight: Appropriate  Engagement in Group:  Engaged  Modes of Intervention:  Discussion  Additional Comments:  Patient stated that her day was good. Patient's goal for the day was "to get a grip on my attitude". Patient stated that she met her goal after breakfast.  Anakaren Campion L Nicey Krah 05/29/2017, 8:52 PM

## 2017-05-30 LAB — LIPID PANEL
Cholesterol: 153 mg/dL (ref 0–200)
HDL: 62 mg/dL (ref >40)
LDL Cholesterol: 62 mg/dL (ref 0–99)
Total CHOL/HDL Ratio: 2.5 RATIO
Triglycerides: 144 mg/dL (ref <150)
VLDL: 29 mg/dL (ref 0–40)

## 2017-05-30 LAB — TSH: TSH: 1.478 u[IU]/mL (ref 0.350–4.500)

## 2017-05-30 MED ORDER — TRIAMCINOLONE ACETONIDE 0.5 % EX CREA
TOPICAL_CREAM | Freq: Three times a day (TID) | CUTANEOUS | Status: DC
  Administered 2017-05-31 – 2017-06-02 (×6): via TOPICAL
  Filled 2017-05-30: qty 15

## 2017-05-30 MED ORDER — PRAZOSIN HCL 2 MG PO CAPS
2.0000 mg | ORAL_CAPSULE | Freq: Every day | ORAL | Status: DC
  Administered 2017-05-30: 2 mg via ORAL
  Filled 2017-05-30 (×3): qty 1

## 2017-05-30 MED ORDER — CLONAZEPAM 0.5 MG PO TABS
0.2500 mg | ORAL_TABLET | Freq: Two times a day (BID) | ORAL | Status: DC
  Administered 2017-05-31: 0.25 mg via ORAL
  Filled 2017-05-30: qty 1

## 2017-05-30 NOTE — Progress Notes (Signed)
D:  Patient's self inventory sheet, patient sleeps good, no sleep medication given.  Good appetite, hyper energy level, poor concentration.  Denied depression and hopeless.  Rated anxiety #9.  Denied withdrawals.  Denied SI.  Denied physical problems.  Denied physical pain.  Goal is controlling emotions.  Plans to breathe and relax.  Does have discharge plans. A:  Medications administered per MD orders.  Emotional support and encouragement given patient. R:  Denied SI and HI, contracts for safety.  Denied A/V hallucinations.  Safety maintained with 15 minute checks.

## 2017-05-30 NOTE — Progress Notes (Signed)
Recreation Therapy Notes  Animal-Assisted Activity (AAA) Program Checklist/Progress Notes Patient Eligibility Criteria Checklist & Daily Group note for Rec Tx Intervention  Date: 05/30/2017 Time: 2:50pm [address]    AAA/T Program Assumption of Risk Form signed by Patient/ or Parent Legal Guardian Yes  Patient is free of allergies or sever asthma Yes  Patient reports no fear of animals Yes  Patient reports no history of cruelty to animals Yes  Patient understands his/her participation is voluntary Yes  Patient washes hands before animal contact Yes  Patient washes hands after animal contact Yes  Behavioral Response: engaged   Education: Charity fundraiserHand Washing, Appropriate Animal Interaction   Education Outcome: Acknowledges education.   Clinical Observations/Feedback: Patient discussed with MD for appropriateness in pet therapy session. Both LRT and MD agree patient is appropriate for participation. Patient offered participation in session and signed necessary consent form without issue. Patient attended session and interacted appropriately with therapy dog and peers. Patient asked appropriate questions about therapy dog and his training. Patient shared stories about their pets at home with group.   Danielle Fullerachel Breton Berns, Recreation Therapy Intern           Danielle FullerRachel Naijah Estes 05/30/2017 3:20 PM
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
## 2017-05-30 NOTE — Plan of Care (Signed)
Problem: Coping: Goal: Ability to cope will improve Outcome: Progressing Nurse discussed anxiety/depression/coping skills with patient.    

## 2017-05-30 NOTE — Progress Notes (Signed)
Patient in her room resting at the beginning of the shift. She appeared sad and depressed. She had conversation with someone earlier and slammed the phone and was very angry after that. She seemed upset after that. Patient said she would tal to Clinical research associatewriter later. She denied SI/HI and denied Hallucinations. Q 15 minutes check continues as ordered for safety. Her B/P was elevated 160/96. Will notify PA. Q 15 minute check continues as ordered for safety.

## 2017-05-30 NOTE — [facility] Suicide Risk Assessment (Signed)
[facility] INPATIENT:  Family/Significant Other Suicide Prevention Education  Suicide Prevention Education:  Education Completed; Riley Lamdrick Wallander (husband) [phone] has been identified by the patient as the family member/significant other with whom the patient will be residing, and identified as the person(s) who will aid the patient in the event of a mental health crisis (suicidal ideations/suicide attempt).  With written consent from the patient, the family member/significant other has been provided the following suicide prevention education, prior to the and/or following the discharge of the patient.  The suicide prevention education provided includes the following:  Suicide risk factors  Suicide prevention and interventions  National Suicide Hotline telephone number  [facility] assessment telephone number  [facility] Emergency Assistance 911  [facility] and/or Residential Mobile Crisis Unit telephone number  Request made of family/significant other to:  Remove weapons (e.g., guns, rifles, knives), all items previously/currently identified as safety concern.    Remove drugs/medications (over-the-counter, prescriptions, illicit drugs), all items previously/currently identified as a safety concern.  The family member/significant other verbalizes understanding of the suicide prevention education information provided.  The family member/significant other agrees to remove the items of safety concern listed above.  Sempra EnergyCandace L Jaskarn Schweer MSW, LCSWA  05/30/2017, 4:25 PM

## 2017-05-30 NOTE — Progress Notes (Signed)
Recreation Therapy Notes  Date: 05/30/17 Time: 1000 Location: [address] Dayroom  Group Topic: Leisure Education  Goal Area(s) Addresses:  Patient will identify positive leisure activities.  Patient will identify one positive benefit of participation in leisure activities.   Behavioral Response: Engaged  Intervention: AT&TWhite board, dry erase marker, eraser, can with various words and activities.  Activity: Pictionary.  Patients were divided into two teams.  Each player from each team would get a chance to draw a picture on the board.  Players were to pick a slip of paper from the can and draw what was on the paper.  Their team would then try to guess what the picture is.  If the teams does not guess correctly the other team gets a chance to steal the point.  Each team gets one minute per turn.  Education:  Leisure Education, Building control surveyorDischarge Planning  Education Outcome: Acknowledges education/In group clarification offered/Needs additional education  Clinical Observations/Feedback: Pt stated leisure "helps you get away and take time for yourself".  Pt expressed that most people don't use their leisure time wisely because they just don't plan for it.  Pt stated some examples of leisure are writing, coloring and reading.  Pt was very active during group.  Pt worked well with her peers.  Pt was brighter and more social.  Pt shared that her kids take her old shirts and design them to fit their own styles.    Caroll RancherMarjette Dequavion Follette, LRT/CTRS         Caroll RancherLindsay, Sussie Minor A 05/30/2017 12:32 PM

## 2017-05-30 NOTE — [facility] Group Notes (Signed)
[facility] LCSW Group Therapy  05/30/2017 2:15 PM  Type of Therapy:  Group Therapy  Participation Level:  Did Not Attend  Summary of Progress/Problems:Balance in life: Patients will discuss the concept of balance and how it looks and feels to be unbalanced. Pt will identify areas in their life that is unbalanced and ways to become more balanced.   Danielle Estes L Tobias Avitabile MSW, LCSWA  05/30/2017, 2:15 PM   

## 2017-05-30 NOTE — Progress Notes (Addendum)
Recreation Therapy Notes  INPATIENT RECREATION THERAPY ASSESSMENT  Patient Details Name: Danielle Estes MRN: [id number] DOB: 29-Jun-1981 Today's Date: 05/30/2017  Patient Stressors: Family, Death, Other (Comment) (Homeless)  Pt stated she was here because of a nervous breakdown. Pt stated her father died two days before Christmas and sister was diagnosed with cancer on the same day. Pt stated her uncle was diagnosed with stage one cancer.  Coping Skills:   Isolate, Arguments, Substance Abuse, Avoidance, Exercise, Art/Dance, Talking, Music, Sports  Personal Challenges: Anger, Communication, Decision-Making, Social Interaction, Stress Management, Substance Abuse, Time Management, Trusting Others  Leisure Interests (2+):  Individual - Reading, Individual - Writing, Individual - Other (Comment) (Doing hair)  Awareness of Community Resources:  Yes  Community Resources:  Library, Park, Other (Comment) Public relations account executive([employer])  Current Use: Yes  Patient Strengths:  Outspoken, recognize when I'm doing wrong and apologize.  Patient Identified Areas of Improvement:  Slow down thought process, sometimes I speak before I think; More family time and time for myself  Current Recreation Participation:  2 times per week  Patient Goal for Hospitalization:  "Maintain integrity, take criticism without lashing out"  [place]  [place]  Current [place] (including self-harm):  No  Current HI:  No  Consent to Intern Participation: N/A   Caroll RancherMarjette Glee Lashomb, LRT/CTRS  Caroll RancherLindsay, [place] Eppard A 05/30/2017, 12:08 PM

## 2017-05-31 DIAGNOSIS — I10 Essential (primary) hypertension: Secondary | ICD-10-CM | POA: Clinically undetermined

## 2017-05-31 LAB — PROLACTIN: Prolactin: 42.1 ng/mL — ABNORMAL HIGH (ref 4.8–23.3)

## 2017-05-31 MED ORDER — CLONAZEPAM 0.5 MG PO TABS: 0.2500 mg | ORAL_TABLET | Freq: Two times a day (BID) | ORAL | Status: DC | PRN

## 2017-05-31 MED ORDER — LISINOPRIL 10 MG PO TABS
10.0000 mg | ORAL_TABLET | Freq: Every day | ORAL | Status: DC
  Administered 2017-05-31 – 2017-06-02 (×3): 10 mg via ORAL
  Filled 2017-05-31: qty 1
  Filled 2017-05-31: qty 2
  Filled 2017-05-31 (×4): qty 1

## 2017-05-31 MED ORDER — PRAZOSIN HCL 1 MG PO CAPS
1.0000 mg | ORAL_CAPSULE | Freq: Every day | ORAL | Status: DC
  Administered 2017-05-31 – 2017-06-01 (×2): 1 mg via ORAL
  Filled 2017-05-31 (×5): qty 1

## 2017-05-31 NOTE — Progress Notes (Addendum)
[facility] MD Progress Note  05/31/2017 4:23 PM Danielle Estes  MRN:  [id number] Subjective: Patient states " I am feeling better.'     Objective; Patient seen and chart reviewed.Discussed patient with treatment team.  Pt today seen as improving, reports anxiety and sadness as better. Pt continues to have AH - which is quieter . Pt sleeps better. Per RN , denies ADRs, compliant on medications. BP continues to be elevated - will start trial of an antihypertensive.pt agrees .      Principal Problem: Bipolar disorder, curr episode mixed, severe, with psychotic features (HCC) Diagnosis:   Patient Active Problem List   Diagnosis Date Noted  . Essential hypertension [I10] 05/31/2017  . Bipolar disorder, curr episode mixed, severe, with psychotic features (HCC) [F31.64] 05/29/2017  . PTSD (post-traumatic stress disorder) [F43.10] 05/29/2017  . Pulmonary embolism on right (HCC) [I26.99] 05/10/2015  . Cholelithiasis without cholecystitis [K80.20] 05/10/2015  . Postoperative anemia due to acute blood loss [D62] 05/10/2015  . Pre-eclampsia superimposed on chronic hypertension, antepartum [O11.9, O10.019] 04/18/2015  . Chronic hypertension [I10]   . [name], fetal, affecting care of mother, antepartum [O45.0XX0]   . Small for dates affecting management of mother [O36.5990]   . Supervision of high risk pregnancy in third trimester [O09.93] 01/07/2015  . H/O: C-section [W09.811] 01/07/2015  . Essential hypertension antepartum [O10.019]   . AMA (advanced maternal age) multigravida 35+ [O64.529]    Total Time spent with patient: 25 minutes  Past Psychiatric History: Reviewed.  Past Medical History:  Past Medical History:  Diagnosis Date  . Anemia   . Bipolar 1 disorder (HCC)   . Depression   . GERD (gastroesophageal reflux disease)   . Headache   . Hypertension   . Multiple allergies   . Pulmonary embolism (HCC)   . Vaginal Pap smear, abnormal     Past Surgical  History:  Procedure Laterality Date  . BREAST SURGERY     had lumped removed at age 64. non cancer  . CESAREAN SECTION     C/S x 1  . CESAREAN SECTION N/A 04/19/2015   Procedure: CESAREAN SECTION;  Surgeon: Adam Phenix, MD;  Location: [facility];  Service: Obstetrics;  Laterality: N/A;   Family History:  Family History  Problem Relation Age of Onset  . Hypertension Maternal Aunt   . Cancer Maternal Aunt   . Heart disease Maternal Grandmother    Family Psychiatric  History: reviewed. Social History:  History  Alcohol Use No     History  Drug Use  . Types: Marijuana    Comment: quit 2015    Social History   Social History  . Marital status: Married    Spouse name: N/A  . Number of children: N/A  . Years of education: N/A   Social History Main Topics  . Smoking status: Former Games developer  . Smokeless tobacco: Never Used  . Alcohol use No  . Drug use: Yes    Types: Marijuana     Comment: quit 2015  . Sexual activity: Yes    Birth control/ protection: None   Other Topics Concern  . None   Social History Narrative  . None   Additional Social History:                         Sleep: Fair  Appetite:  Fair  Current Medications: Current Facility-Administered Medications  Medication Dose Route Frequency Provider Last Rate Last Dose  .  asenapine (SAPHRIS) sublingual tablet 5 mg  5 mg Sublingual BID PRN Tor Tsuda, Levin Bacon, MD      . clonazePAM (KLONOPIN) tablet 0.25 mg  0.25 mg Oral BID PRN Saber Dickerman, MD      . divalproex (DEPAKOTE) DR tablet 250 mg  250 mg Oral [id number] Tanveer Brammer, Levin Bacon, MD   250 mg at 05/31/17 0831  . eszopiclone (LUNESTA) tablet 2 mg  2 mg Oral QHS Cobos, Rockey Situ, MD   2 mg at 05/30/17 2123  . ferrous sulfate tablet 325 mg  325 mg Oral BID WC Ramces Shomaker, MD   325 mg at 05/31/17 0831  . hydrOXYzine (ATARAX/VISTARIL) tablet 25 mg  25 mg Oral TID PRN Beryle Lathe, Justina A, NP   25 mg at 05/30/17 1028  . lisinopril (PRINIVIL,ZESTRIL)  tablet 10 mg  10 mg Oral Daily Arian Mcquitty, MD   10 mg at 05/31/17 1113  . magnesium hydroxide (MILK OF MAGNESIA) suspension 30 mL  30 mL Oral Daily PRN Okonkwo, Justina A, NP      . OLANZapine (ZYPREXA) tablet 5 mg  5 mg Oral [id number] Lakena Sparlin, Levin Bacon, MD   5 mg at 05/31/17 1527  . ondansetron (ZOFRAN) tablet 4 mg  4 mg Oral Q8H PRN Okonkwo, Justina A, NP      . polyethylene glycol (MIRALAX / GLYCOLAX) packet 17 g  17 g Oral Daily Shantinique Picazo, MD   17 g at 05/30/17 0820  . prazosin (MINIPRESS) capsule 2 mg  2 mg Oral QHS Jomarie Longs, MD   2 mg at 05/30/17 2122  . traZODone (DESYREL) tablet 50 mg  50 mg Oral QHS PRN Okonkwo, Justina A, NP   50 mg at 05/27/17 2309  . triamcinolone cream (KENALOG) 0.5 %   Topical TID Jomarie Longs, MD        Lab Results:  Results for orders placed or performed during the hospital encounter of 05/26/17 (from the past 48 hour(s))  Lipid panel     Status: None   Collection Time: 05/30/17  6:40 AM  Result Value Ref Range   Cholesterol 153 0 - 200 mg/dL   Triglycerides 914 <782 mg/dL   HDL 62 >95 mg/dL   Total CHOL/HDL Ratio 2.5 RATIO   VLDL 29 0 - 40 mg/dL   LDL Cholesterol 62 0 - 99 mg/dL    Comment:        Total Cholesterol/HDL:CHD Risk Coronary Heart Disease Risk Table                     Men   Women  1/2 Average Risk   3.4   3.3  Average Risk       5.0   4.4  2 X Average Risk   9.6   7.1  3 X Average Risk  23.4   11.0        Use the calculated Patient Ratio above and the CHD Risk Table to determine the patient's CHD Risk.        ATP III CLASSIFICATION (LDL):  <100     mg/dL   Optimal  621-308  mg/dL   Near or Above                    Optimal  130-159  mg/dL   Borderline  657-846  mg/dL   High  >962     mg/dL   Very High Performed at [facility], [address] [place], [place] [address]  Prolactin     Status: Abnormal   Collection Time: 05/30/17  6:40 AM  Result Value Ref Range   Prolactin 42.1 (H) 4.8 - 23.3 ng/mL     Comment: (NOTE) Performed At: [facility] [address] [place], [place] [id number] Mila Homer MD VH:[id number] Performed at [facility], [address] [place], [place] [address]   TSH     Status: None   Collection Time: 05/30/17  6:40 AM  Result Value Ref Range   TSH 1.478 0.350 - 4.500 uIU/mL    Comment: Performed by a 3rd Generation assay with a functional sensitivity of <=0.01 uIU/mL. Performed at [facility], [address], [place], [place] [address]     Blood Alcohol level:  No results found for: [facility]  Metabolic Disorder Labs: Lab Results  Component Value Date   HGBA1C 5.4 05/27/2017   MPG 108 05/27/2017   MPG 105 07/25/2010   Lab Results  Component Value Date   PROLACTIN 42.1 (H) 05/30/2017   Lab Results  Component Value Date   CHOL 153 05/30/2017   TRIG 144 05/30/2017   HDL 62 05/30/2017   CHOLHDL 2.5 05/30/2017   VLDL 29 05/30/2017   LDLCALC 62 05/30/2017   LDLCALC  07/25/2010    82        Total Cholesterol/HDL:CHD Risk Coronary Heart Disease Risk Table                     Men   Women  1/2 Average Risk   3.4   3.3  Average Risk       5.0   4.4  2 X Average Risk   9.6   7.1  3 X Average Risk  23.4   11.0        Use the calculated Patient Ratio above and the CHD Risk Table to determine the patient's CHD Risk.        ATP III CLASSIFICATION (LDL):  <100     mg/dL   Optimal  725-366  mg/dL   Near or Above                    Optimal  130-159  mg/dL   Borderline  440-347  mg/dL   High  >425     mg/dL   Very High    Physical Findings: AIMS: Facial and Oral Movements Muscles of Facial Expression: None, normal Lips and Perioral Area: None, normal Jaw: None, normal Tongue: None, normal,Extremity Movements Upper (arms, wrists, hands, fingers): None, normal Lower (legs, knees, ankles, toes): None, normal, Trunk Movements Neck, shoulders, hips: None, normal, Overall Severity Severity of  abnormal movements (highest score from questions above): None, normal Incapacitation due to abnormal movements: None, normal Patient's awareness of abnormal movements (rate only patient's report): No Awareness, Dental Status Current problems with teeth and/or dentures?: No Does patient usually wear dentures?: No  CIWA:  CIWA-Ar Total: 1 COWS:  COWS Total Score: 2  Musculoskeletal: Strength & Muscle Tone: within normal limits Gait & Station: normal Patient leans: N/A  Psychiatric Specialty Exam: Physical Exam  Nursing note and vitals reviewed.   Review of Systems  Psychiatric/Behavioral: Positive for depression and hallucinations. The patient is nervous/anxious.   All other systems reviewed and are negative.   Blood pressure 138/90, pulse (!) 101, temperature 97.6 F (36.4 C), temperature source Oral, resp. rate 20, height 5' 10.47" (1.79 m), weight 96.2 kg (212 lb), currently breastfeeding.Body mass index  is 30.01 kg/m.  General Appearance: Casual  Eye Contact:  Fair  Speech:  Normal Rate  Volume:  Normal  Mood:  Anxious and Depressed  Affect:  Congruent  Thought Process:  Goal Directed and Descriptions of Associations: Circumstantial  Orientation:  Full (Time, Place, and Person)  Thought Content:  Paranoid Ideation and Rumination  Suicidal Thoughts:  No  Homicidal Thoughts:  No  Memory:  Immediate;   Fair Recent;   Fair Remote;   Fair  Judgement:  Fair  Insight:  Fair  Psychomotor Activity:  Normal  Concentration:  Concentration: Fair and Attention Span: Fair  Recall:  FiservFair  Fund of Knowledge:  Fair  Language:  Fair  Akathisia:  No  Handed:  Right  AIMS (if indicated):     Assets:  Communication Skills Desire for Improvement  ADL's:  Intact  Cognition:  WNL  Sleep:  Number of Hours: 6   Bipolar disorder, curr episode mixed, severe, with psychotic features (HCC) improving    Will continue today 05/31/17  plan as below except where it is  noted.   Assessment: Patient with bipolar do , pTSD , sleep issues , making progress , continue treatment. POSSIBLE DISCHARGE BY Friday AFTER DEPAKOTE LEVEL.   Treatment Plan Summary: Daily contact with patient to assess and evaluate symptoms and progress in treatment and Medication management  Depakote 250 mg po tid for mood sx. Depakote level on Friday - 06/02/17. Continue Lunesta 2 mg po qhs for insomnia. Will reduce Prazosin to 1 mg po qhs for nightmares. Will start Lisinopril 10 mg po daily for elevated BP. Zyprexa 5 mg po tid for mood sx and augment Depakote. Will change Klonopin to 0.25 mg po bid prn for anxiety sx. Continue Prednisone 40 mg x4 doses for skin lesions. Triamcinolone 0.5 % for topical use for skin issues - she was taking it at home. Reviewed EKG repeat - sinus tach-QTC - WNL. Reviewed - labs - tsh- wnl, lipid panel - wnl. Provided substance abuse counseling - for cannabis use. CSW will continue to work on disposition.      Jiles HaroldIEappen,Eshika Reckart, MD 05/31/2017, 4:23 PM

## 2017-05-31 NOTE — [facility] Group Notes (Signed)
[facility] Group Notes:  (Counselor/Nursing/MHT/Case Management/Adjunct)  05/31/2017 1:15PM  Type of Therapy:  Group Therapy  Participation Level:  Active  Participation Quality:  Appropriate  Affect:  Flat  Cognitive:  Oriented  Insight:  Improving  Engagement in Group:  Limited  Engagement in Therapy:  Limited  Modes of Intervention:  Discussion, Exploration and Socialization  Summary of Progress/Problems: The topic for group was balance in life.  Pt participated in the discussion about when their life was in balance and out of balance and how this feels.  Pt discussed ways to get back in balance and short term goals they can work on to get where they want to be. Stayed the entire time, engaged throughout.  Lots of good feedback to others.  "I feel balanced today.  When I'm unbalanced, I am aggravated by any and all interactions with others, even if the other person is positive."  Stated she finds balance by reading, writing poetry for greeting cards.   Daryel Geraldorth, Leani Myron B 05/31/2017 4:18 PM

## 2017-05-31 NOTE — Progress Notes (Signed)
Nursing Note 05/31/2017 [id number]  Data Reports sleeping good without PRN sleep med.  Rates depression 0/10, hopelessness 0/10, and anxiety 3/10. Affect wide ranged and appropriate.  Denies HI, SI, AVH.  BP continues to be elevated- MD aware new orders received (see MAR).   Action Spoke with patient 1:1, nurse offered support to patient throughout shift.  BP medicine given after education provided for Lisinopril.  Continues to be monitored on 15 minute checks for safety.  Response Vitals rechecked, BP came down, medicine effective.  Remains safe and appropriate on unit

## 2017-05-31 NOTE — [facility] Group Notes (Signed)
Patient rates her day as a 10, saying that she had a great day. Patient says that she knows that she needs to work on her attitude and learn to be more optimistic.

## 2017-05-31 NOTE — Progress Notes (Signed)
Recreation Therapy Notes  Date: 05/31/17 Time: 1000 Location: 500 Hall Dayroom  Group Topic: Self-Esteem  Goal Area(s) Addresses:  Patient will identify positive ways to increase self-esteem. Patient will verbalize benefit of increased self-esteem.  Behavioral Response: Engaged  Intervention: Markers, construction paper, scissors  Activity: Brochure About Me.  Patients were to create a brochure that highlighted the positive qualities about themselves.  Patients were to identify things that make them unique or that make them proud.  Education:  Self-Esteem, Building control surveyorDischarge Planning.   Education Outcome: Acknowledges education/In group clarification offered/Needs additional education  Clinical Observations/Feedback: Pt stated self esteem is "how you feel about yourself"  Pt also stated self esteem is influenced by what we go through.  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  Pt explained that if she focuses on her self esteem, she will "have a grip on everything".   Caroll RancherMarjette Tameika Heckmann, LRT/CTRS         Caroll RancherLindsay, Naphtali Zywicki A 05/31/2017 12:09 PM

## 2017-06-01 DIAGNOSIS — R443 Hallucinations, unspecified: Secondary | ICD-10-CM

## 2017-06-01 DIAGNOSIS — I10 Essential (primary) hypertension: Secondary | ICD-10-CM

## 2017-06-01 DIAGNOSIS — F419 Anxiety disorder, unspecified: Secondary | ICD-10-CM

## 2017-06-01 MED ORDER — BIOTENE DRY MOUTH MT LIQD
15.0000 mL | OROMUCOSAL | Status: DC | PRN
  Filled 2017-06-01: qty 15

## 2017-06-01 NOTE — Tx Team (Signed)
Interdisciplinary Treatment and Diagnostic Plan Update  06/01/2017 Time of Session: 9:00 AM Danielle Estes MRN: [id number]  Principal Diagnosis: Bipolar disorder, curr episode mixed, severe, with psychotic features (HCC)  Secondary Diagnoses: Principal Problem:   Bipolar disorder, curr episode mixed, severe, with psychotic features (HCC) Active Problems:   PTSD (post-traumatic stress disorder)   Essential hypertension   Current Medications:  Current Facility-Administered Medications  Medication Dose Route Frequency Provider Last Rate Last Dose  . antiseptic oral rinse (BIOTENE) solution 15 mL  15 mL Mouth Rinse PRN Donell Sievert E, PA-C      . asenapine (SAPHRIS) sublingual tablet 5 mg  5 mg Sublingual BID PRN Eappen, Levin Bacon, MD      . clonazePAM (KLONOPIN) tablet 0.25 mg  0.25 mg Oral BID PRN Eappen, Saramma, MD      . divalproex (DEPAKOTE) DR tablet 250 mg  250 mg Oral [id number] Jomarie Longs, MD   250 mg at 06/01/17 0824  . eszopiclone (LUNESTA) tablet 2 mg  2 mg Oral QHS Cobos, Rockey Situ, MD   2 mg at 05/31/17 2109  . ferrous sulfate tablet 325 mg  325 mg Oral BID WC Eappen, Saramma, MD   325 mg at 06/01/17 0824  . hydrOXYzine (ATARAX/VISTARIL) tablet 25 mg  25 mg Oral TID PRN Beryle Lathe, Justina A, NP   25 mg at 05/30/17 1028  . lisinopril (PRINIVIL,ZESTRIL) tablet 10 mg  10 mg Oral Daily Jomarie Longs, MD   10 mg at 06/01/17 0824  . magnesium hydroxide (MILK OF MAGNESIA) suspension 30 mL  30 mL Oral Daily PRN Okonkwo, Justina A, NP      . OLANZapine (ZYPREXA) tablet 5 mg  5 mg Oral [id number] Jomarie Longs, MD   5 mg at 06/01/17 0824  . ondansetron (ZOFRAN) tablet 4 mg  4 mg Oral Q8H PRN Okonkwo, Justina A, NP      . polyethylene glycol (MIRALAX / GLYCOLAX) packet 17 g  17 g Oral Daily Eappen, Saramma, MD   17 g at 05/30/17 0820  . prazosin (MINIPRESS) capsule 1 mg  1 mg Oral QHS Eappen, Saramma, MD   1 mg at 05/31/17 2109  . traZODone (DESYREL) tablet 50 mg  50 mg Oral  QHS PRN Okonkwo, Justina A, NP   50 mg at 05/27/17 2309  . triamcinolone cream (KENALOG) 0.5 %   Topical TID Jomarie Longs, MD       PTA Medications: Prescriptions Prior to Admission  Medication Sig Dispense Refill Last Dose  . enalapril (VASOTEC) 20 MG tablet Take 1 tablet (20 mg total) by mouth daily. (Patient not taking: Reported on 05/25/2017) 30 tablet 3 Not Taking at Unknown time  . warfarin (COUMADIN) 5 MG tablet Take 1.5 tablets (7.5 mg total) by mouth daily at 6 PM. (Patient not taking: Reported on 05/25/2017) 30 tablet 3 Not Taking at Unknown time    Patient Stressors: Financial difficulties Medication change or noncompliance Occupational concerns  Patient Strengths: Ability for insight Average or above average intelligence Communication skills Motivation for treatment/growth Supportive family/friends  Treatment Modalities: Medication Management, Group therapy, Case management,  1 to 1 session with clinician, Psychoeducation, Recreational therapy.   Physician Treatment Plan for Primary Diagnosis: Bipolar disorder, curr episode mixed, severe, with psychotic features (HCC) Long Term Goal(s): Improvement in symptoms so as ready for discharge Improvement in symptoms so as ready for discharge   Short Term Goals: Ability to verbalize feelings will improve Ability to disclose and discuss suicidal ideas Ability to demonstrate  self-control will improve Ability to identify and develop effective coping behaviors will improve Ability to maintain clinical measurements within normal limits will improve Compliance with prescribed medications will improve Ability to verbalize feelings will improve Ability to disclose and discuss suicidal ideas Ability to identify triggers associated with substance abuse/mental health issues will improve  Medication Management: Evaluate patient's response, side effects, and tolerance of medication regimen.  Therapeutic Interventions: 1 to 1 sessions,  Unit Group sessions and Medication administration.  Evaluation of Outcomes: Adequate for Discharge  Physician Treatment Plan for Secondary Diagnosis: Principal Problem:   Bipolar disorder, curr episode mixed, severe, with psychotic features (HCC) Active Problems:   PTSD (post-traumatic stress disorder)   Essential hypertension  Long Term Goal(s): Improvement in symptoms so as ready for discharge Improvement in symptoms so as ready for discharge   Short Term Goals: Ability to verbalize feelings will improve Ability to disclose and discuss suicidal ideas Ability to demonstrate self-control will improve Ability to identify and develop effective coping behaviors will improve Ability to maintain clinical measurements within normal limits will improve Compliance with prescribed medications will improve Ability to verbalize feelings will improve Ability to disclose and discuss suicidal ideas Ability to identify triggers associated with substance abuse/mental health issues will improve     Medication Management: Evaluate patient's response, side effects, and tolerance of medication regimen.  Therapeutic Interventions: 1 to 1 sessions, Unit Group sessions and Medication administration.  Evaluation of Outcomes: Adequate for Discharge   RN Treatment Plan for Primary Diagnosis: Bipolar disorder, curr episode mixed, severe, with psychotic features (HCC) Long Term Goal(s): Knowledge of disease and therapeutic regimen to maintain health will improve  Short Term Goals: Ability to remain free from injury will improve, Ability to verbalize feelings will improve, Ability to disclose and discuss suicidal ideas and Compliance with prescribed medications will improve  Medication Management: RN will administer medications as ordered by provider, will assess and evaluate patient's response and provide education to patient for prescribed medication. RN will report any adverse and/or side effects to  prescribing provider.  Therapeutic Interventions: 1 on 1 counseling sessions, Psychoeducation, Medication administration, Evaluate responses to treatment, Monitor vital signs and CBGs as ordered, Perform/monitor CIWA, COWS, AIMS and Fall Risk screenings as ordered, Perform wound care treatments as ordered.  Evaluation of Outcomes: Adequate for Discharge   LCSW Treatment Plan for Primary Diagnosis: Bipolar disorder, curr episode mixed, severe, with psychotic features (HCC) Long Term Goal(s): Safe transition to appropriate next level of care at discharge, Engage patient in therapeutic group addressing interpersonal concerns.  Short Term Goals: Engage patient in aftercare planning with referrals and resources, Increase social support and Increase ability to appropriately verbalize feelings  Therapeutic Interventions: Assess for all discharge needs, 1 to 1 time with Social worker, Explore available resources and support systems, Assess for adequacy in community support network, Educate family and significant other(s) on suicide prevention, Complete Psychosocial Assessment, Interpersonal group therapy.  Evaluation of Outcomes: Adequate for Discharge   Progress in Treatment: Attending groups: Yes. Participating in groups: Yes. Taking medication as prescribed: Yes. Toleration medication: Yes. Family/Significant other contact made: Yes Patient understands diagnosis: Yes. Discussing patient identified problems/goals with staff: Yes. Medical problems stabilized or resolved: Yes. Denies suicidal/homicidal ideation: Yes. Issues/concerns per patient self-inventory: No.  New problem(s) identified: No, Describe:  None identified.  New Short Term/Long Term Goal(s): Patient stated goal is to get on the right medications.  Discharge Plan or Barriers: CSW assessing appropriate discharge plan.  Reason for Continuation of  Hospitalization:   Estimated Length of Stay: Likely d/c tomorrow    Attendees: Patient:  06/01/2017 10:59 AM  Physician: Dr. Elna BreslowEappen, MD 06/01/2017 10:59 AM  Nursing: Marzetta Boardhrista Dopson, RN  06/01/2017 10:59 AM  RN Care Manager: 06/01/2017 10:59 AM  Social Worker: Richelle Itood Dameka Younker LCSW 06/01/2017 10:59 AM  Recreational Therapist:  06/01/2017 10:59 AM  Other:  06/01/2017 10:59 AM  Other:  06/01/2017 10:59 AM  Other: 06/01/2017 10:59 AM    Scribe for Treatment Team: Ida Rogueodney B Caisley Baxendale, LCSW 06/01/2017 10:59 AM

## 2017-06-01 NOTE — Progress Notes (Signed)
   D: When asked about her day, pt stated. "my babies need me and it breaks my heart every time they leave", referring to the visits at [facility]. Pt explained that she realizes she needs help in order to care for her children. Pt complained of "drop mouth"  A: Order was received for biotene. Support and encouragement was offered. 15 min checks continued for safety.  R: Pt remains safe.

## 2017-06-01 NOTE — Progress Notes (Signed)
Recreation Therapy Notes  Date: 06/01/17 Time: 1000 Location: 500 Hall Dayroom  Group Topic: Communication, Team Building, Problem Solving  Goal Area(s) Addresses:  Patient will effectively work with peer towards shared goal.  Patient will identify skills used to make activity successful.  Patient will identify how skills used during activity can be used to reach post d/c goals.   Behavioral Response: Engaged  Intervention: STEM Activity  Activity: Stage managerLanding Pad. In teams patients were given 12 plastic drinking straws and a length of masking tape. Using the materials provided patients were asked to build a landing pad to catch a golf ball dropped from approximately 6 feet in the air.   Education: Pharmacist, communityocial Skills, Discharge Planning   Education Outcome: Acknowledges education/In group clarification offered/Needs additional education.   Clinical Observations/Feedback:  Pt was very engaged and worked well with her peer to come up with a concept and put it together.  Pt showed good teammate qualities by being able to give instructions as well as take them from her partner.  Pt left early to meet with doctor and came back at the end of group.   [name], LRT/CTRS         [name], [name] 06/01/2017 11:45 AM

## 2017-06-01 NOTE — Progress Notes (Signed)
[facility] MD Progress Note  06/01/2017 12:54 PM Heba S Burklow  MRN:  [id number]   Subjective: Patient states " I am feeling better and working to get the resources because my family is homeless, Like [facility] [place] [facility].'  Objective; Patient seen and chart reviewed.Discussed patient with treatment team. Patient is calm and cooperative and pleasant during this assessment. Patient stated that she has been actively participating in milieu therapy, group counseling therapies, learning coping skills also working with the CSW regarding several options of homelessness for her family. Patient has been compliant with her medication and reportedly has no adverse effects. Patient reports mild depression, anxiety and reportedly less hallucinations. Patient reportedly sleeping well without any difficulties. Patient is waiting for valproic acid level and may be discharged tomorrow.Patient has a mild elevated blood pressure which was addressed with medication management during this visit.  Principal Problem: Bipolar disorder, curr episode mixed, severe, with psychotic features (HCC) Diagnosis:   Patient Active Problem List   Diagnosis Date Noted  . Essential hypertension [I10] 05/31/2017  . Bipolar disorder, curr episode mixed, severe, with psychotic features (HCC) [F31.64] 05/29/2017  . PTSD (post-traumatic stress disorder) [F43.10] 05/29/2017  . Pulmonary embolism on right (HCC) [I26.99] 05/10/2015  . Cholelithiasis without cholecystitis [K80.20] 05/10/2015  . Postoperative anemia due to acute blood loss [D62] 05/10/2015  . Pre-eclampsia superimposed on chronic hypertension, antepartum [O11.9, O10.019] 04/18/2015  . Chronic hypertension [I10]   . [name], fetal, affecting care of mother, antepartum [O37.0XX0]   . Small for dates affecting management of mother [O36.5990]   . Supervision of high risk pregnancy in third trimester [O09.93] 01/07/2015  . H/O: C-section  [W09.811] 01/07/2015  . Essential hypertension antepartum [O10.019]   . AMA (advanced maternal age) multigravida 35+ [O37.529]    Total Time spent with patient: 25 minutes  Past Psychiatric History: Reviewed.  Past Medical History:  Past Medical History:  Diagnosis Date  . Anemia   . Bipolar 1 disorder (HCC)   . Depression   . GERD (gastroesophageal reflux disease)   . Headache   . Hypertension   . Multiple allergies   . Pulmonary embolism (HCC)   . Vaginal Pap smear, abnormal     Past Surgical History:  Procedure Laterality Date  . BREAST SURGERY     had lumped removed at age 4. non cancer  . CESAREAN SECTION     C/S x 1  . CESAREAN SECTION N/A 04/19/2015   Procedure: CESAREAN SECTION;  Surgeon: Adam Phenix, MD;  Location: [facility];  Service: Obstetrics;  Laterality: N/A;   Family History:  Family History  Problem Relation Age of Onset  . Hypertension Maternal Aunt   . Cancer Maternal Aunt   . Heart disease Maternal Grandmother    Family Psychiatric  History: reviewed. Social History:  History  Alcohol Use No     History  Drug Use  . Types: Marijuana    Comment: quit 2015    Social History   Social History  . Marital status: Married    Spouse name: N/A  . Number of children: N/A  . Years of education: N/A   Social History Main Topics  . Smoking status: Former Games developer  . Smokeless tobacco: Never Used  . Alcohol use No  . Drug use: Yes    Types: Marijuana     Comment: quit 2015  . Sexual activity: Yes    Birth control/ protection: None   Other Topics Concern  .  None   Social History Narrative  . None   Additional Social History:      Sleep: Fair  Appetite:  Fair  Current Medications: Current Facility-Administered Medications  Medication Dose Route Frequency Provider Last Rate Last Dose  . antiseptic oral rinse (BIOTENE) solution 15 mL  15 mL Mouth Rinse PRN Donell Sievert E, PA-C      . asenapine (SAPHRIS) sublingual tablet 5 mg  5 mg  Sublingual BID PRN Eappen, Levin Bacon, MD      . clonazePAM (KLONOPIN) tablet 0.25 mg  0.25 mg Oral BID PRN Eappen, Saramma, MD      . divalproex (DEPAKOTE) DR tablet 250 mg  250 mg Oral [id number] Jomarie Longs, MD   250 mg at 06/01/17 0824  . eszopiclone (LUNESTA) tablet 2 mg  2 mg Oral QHS Cobos, Rockey Situ, MD   2 mg at 05/31/17 2109  . ferrous sulfate tablet 325 mg  325 mg Oral BID WC Eappen, Saramma, MD   325 mg at 06/01/17 0824  . hydrOXYzine (ATARAX/VISTARIL) tablet 25 mg  25 mg Oral TID PRN Beryle Lathe, Justina A, NP   25 mg at 05/30/17 1028  . lisinopril (PRINIVIL,ZESTRIL) tablet 10 mg  10 mg Oral Daily Jomarie Longs, MD   10 mg at 06/01/17 0824  . magnesium hydroxide (MILK OF MAGNESIA) suspension 30 mL  30 mL Oral Daily PRN Okonkwo, Justina A, NP      . OLANZapine (ZYPREXA) tablet 5 mg  5 mg Oral [id number] Jomarie Longs, MD   5 mg at 06/01/17 0824  . ondansetron (ZOFRAN) tablet 4 mg  4 mg Oral Q8H PRN Okonkwo, Justina A, NP      . polyethylene glycol (MIRALAX / GLYCOLAX) packet 17 g  17 g Oral Daily Eappen, Saramma, MD   17 g at 05/30/17 0820  . prazosin (MINIPRESS) capsule 1 mg  1 mg Oral QHS Eappen, Saramma, MD   1 mg at 05/31/17 2109  . traZODone (DESYREL) tablet 50 mg  50 mg Oral QHS PRN Okonkwo, Justina A, NP   50 mg at 05/27/17 2309  . triamcinolone cream (KENALOG) 0.5 %   Topical TID Jomarie Longs, MD        Lab Results:  No results found for this or any previous visit (from the past 48 hour(s)).  Blood Alcohol level:  No results found for: [facility]  Metabolic Disorder Labs: Lab Results  Component Value Date   HGBA1C 5.4 05/27/2017   MPG 108 05/27/2017   MPG 105 07/25/2010   Lab Results  Component Value Date   PROLACTIN 42.1 (H) 05/30/2017   Lab Results  Component Value Date   CHOL 153 05/30/2017   TRIG 144 05/30/2017   HDL 62 05/30/2017   CHOLHDL 2.5 05/30/2017   VLDL 29 05/30/2017   LDLCALC 62 05/30/2017   LDLCALC  07/25/2010    82        Total  Cholesterol/HDL:CHD Risk Coronary Heart Disease Risk Table                     Men   Women  1/2 Average Risk   3.4   3.3  Average Risk       5.0   4.4  2 X Average Risk   9.6   7.1  3 X Average Risk  23.4   11.0        Use the calculated Patient Ratio above and the CHD Risk Table to determine the patient's CHD  Risk.        ATP III CLASSIFICATION (LDL):  <100     mg/dL   Optimal  130-865  mg/dL   Near or Above                    Optimal  130-159  mg/dL   Borderline  784-696  mg/dL   High  >295     mg/dL   Very High    Physical Findings: AIMS: Facial and Oral Movements Muscles of Facial Expression: None, normal Lips and Perioral Area: None, normal Jaw: None, normal Tongue: None, normal,Extremity Movements Upper (arms, wrists, hands, fingers): None, normal Lower (legs, knees, ankles, toes): None, normal, Trunk Movements Neck, shoulders, hips: None, normal, Overall Severity Severity of abnormal movements (highest score from questions above): None, normal Incapacitation due to abnormal movements: None, normal Patient's awareness of abnormal movements (rate only patient's report): No Awareness, Dental Status Current problems with teeth and/or dentures?: No Does patient usually wear dentures?: No  CIWA:  CIWA-Ar Total: 1 COWS:  COWS Total Score: 2  Musculoskeletal: Strength & Muscle Tone: within normal limits Gait & Station: normal Patient leans: N/A  Psychiatric Specialty Exam: Physical Exam  Nursing note and vitals reviewed.   Review of Systems  Psychiatric/Behavioral: Positive for depression and hallucinations. The patient is nervous/anxious.   All other systems reviewed and are negative.   Blood pressure (!) 132/94, pulse 91, temperature 98 F (36.7 C), temperature source Oral, resp. rate 18, height 5' 10.47" (1.79 m), weight 96.2 kg (212 lb), currently breastfeeding.Body mass index is 30.01 kg/m.  General Appearance: Casual  Eye Contact:  Fair  Speech:  Normal  Rate  Volume:  Normal  Mood:  Anxious and Depressed  Affect:  Congruent  Thought Process:  Goal Directed and Descriptions of Associations: Circumstantial  Orientation:  Full (Time, Place, and Person)  Thought Content:  Paranoid Ideation and Rumination  Suicidal Thoughts:  No  Homicidal Thoughts:  No  Memory:  Immediate;   Fair Recent;   Fair Remote;   Fair  Judgement:  Fair  Insight:  Fair  Psychomotor Activity:  Normal  Concentration:  Concentration: Fair and Attention Span: Fair  Recall:  [employer] of Knowledge:  Fair  Language:  Fair  Akathisia:  No  Handed:  Right  AIMS (if indicated):     Assets:  Communication Skills Desire for Improvement  ADL's:  Intact  Cognition:  WNL  Sleep:  Number of Hours: 6   Bipolar disorder, curr episode mixed, severe, with psychotic features (HCC) improving  Will continue today 06/01/17  plan as below except where it is noted.  Assessment: Patient with bipolar do , pTSD , sleep issues , making progress , continue treatment.  POSSIBLE DISCHARGE BY Friday AFTER DEPAKOTE LEVEL.   Treatment Plan Summary: Daily contact with patient to assess and evaluate symptoms and progress in treatment and Medication management   Depakote 250 mg po tid for mood sx. Depakote level on Friday - 06/02/17. Continue Lunesta 2 mg po qhs for insomnia. Continue Prazosin to 1 mg po qhs for nightmares. Continue Lisinopril 10 mg po daily for elevated BP. ContinueZyprexa 5 mg po tid for mood sx and augment Depakote. Continue Klonopin to 0.25 mg po bid prn for anxiety sx. Continue Prednisone 40 mg x4 doses for skin lesions. Triamcinolone 0.5 % for topical use for skin issues - she was taking it at home. Reviewed EKG repeat - sinus tach-QTC - WNL. Reviewed -  labs - tsh- wnl, lipid panel - wnl. Provided substance abuse counseling - for cannabis use. CSW will continue to work on disposition. Patient will be discharged 06/02/2017 after reviewing her Depakote level  and possible adjustment if needed  Leata MouseJANARDHANA Britteny Fiebelkorn, MD 06/01/2017, 12:54 PM

## 2017-06-02 ENCOUNTER — Encounter (HOSPITAL_COMMUNITY): Payer: Self-pay | Admitting: Behavioral Health

## 2017-06-02 LAB — VALPROIC ACID LEVEL: VALPROIC ACID LVL: 89 ug/mL (ref 50.0–100.0)

## 2017-06-02 MED ORDER — OLANZAPINE 5 MG PO TABS: 5.0000 mg | ORAL_TABLET | ORAL | 0 refills | Status: DC

## 2017-06-02 MED ORDER — BIOTENE DRY MOUTH MT LIQD: 15.0000 mL | OROMUCOSAL | 0 refills | Status: AC | PRN

## 2017-06-02 MED ORDER — FERROUS SULFATE 325 (65 FE) MG PO TABS: 325.0000 mg | ORAL_TABLET | Freq: Two times a day (BID) | ORAL | 0 refills | Status: DC

## 2017-06-02 MED ORDER — LISINOPRIL 10 MG PO TABS: 10.0000 mg | ORAL_TABLET | Freq: Every day | ORAL | 0 refills | Status: DC

## 2017-06-02 MED ORDER — TRAZODONE HCL 50 MG PO TABS: 50.0000 mg | ORAL_TABLET | Freq: Every evening | ORAL | 0 refills | Status: DC | PRN

## 2017-06-02 MED ORDER — DIVALPROEX SODIUM 250 MG PO DR TAB: 250.0000 mg | DELAYED_RELEASE_TABLET | ORAL | 0 refills | Status: DC

## 2017-06-02 MED ORDER — PRAZOSIN HCL 1 MG PO CAPS: 1.0000 mg | ORAL_CAPSULE | Freq: Every day | ORAL | 0 refills | Status: AC

## 2017-06-02 MED ORDER — ESZOPICLONE 2 MG PO TABS: 2.0000 mg | ORAL_TABLET | Freq: Every day | ORAL | 0 refills | Status: DC

## 2017-06-02 MED ORDER — ASENAPINE MALEATE 5 MG SL SUBL: 5.0000 mg | SUBLINGUAL_TABLET | Freq: Two times a day (BID) | SUBLINGUAL | 0 refills | Status: AC | PRN

## 2017-06-02 MED ORDER — HYDROXYZINE HCL 25 MG PO TABS: 25.0000 mg | ORAL_TABLET | Freq: Three times a day (TID) | ORAL | 0 refills | Status: DC | PRN
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
## 2017-06-02 NOTE — Discharge Summary (Signed)
Physician Discharge Summary Note  Patient:  Danielle Estes is an 36 y.o., female MRN:  [id number] DOB:  09-14-1981 Patient phone:  [phone] (home)  Patient address:   [address]  [place] [address],  Total Time spent with patient: 30 minutes  Date of Admission:  05/26/2017 Date of Discharge: 06/02/2017  Reason for Admission: Hallucinations and  homicidal thoughts  Principal Problem: Bipolar disorder, curr episode mixed, severe, with psychotic features [facility]) Discharge Diagnoses: Patient Active Problem List   Diagnosis Date Noted  . Essential hypertension [I10] 05/31/2017  . Bipolar disorder, curr episode mixed, severe, with psychotic features (HCC) [F31.64] 05/29/2017  . PTSD (post-traumatic stress disorder) [F43.10] 05/29/2017  . Pulmonary embolism on right (HCC) [I26.99] 05/10/2015  . Cholelithiasis without cholecystitis [K80.20] 05/10/2015  . Postoperative anemia due to acute blood loss [D62] 05/10/2015  . Pre-eclampsia superimposed on chronic hypertension, antepartum [O11.9, O10.019] 04/18/2015  . Chronic hypertension [I10]   . [name], fetal, affecting care of mother, antepartum [O41.0XX0]   . Small for dates affecting management of mother [O36.5990]   . Supervision of high risk pregnancy in third trimester [O09.93] 01/07/2015  . H/O: C-section [Q65.784] 01/07/2015  . Essential hypertension antepartum [O10.019]   . AMA (advanced maternal age) multigravida 35+ [O82.529]     Past Psychiatric History: patient has history of bipolar disorder and she was getting treatment while she was in [employer].  Patient endorse history of suicidal attempt by taking overdose on medication but did not get any treatment.  Past Medical History:  Past Medical History:  Diagnosis Date  . Anemia   . Bipolar 1 disorder (HCC)   . Depression   . GERD (gastroesophageal reflux disease)   . Headache   . Hypertension   . Multiple allergies   . Pulmonary embolism  (HCC)   . Vaginal Pap smear, abnormal     Past Surgical History:  Procedure Laterality Date  . BREAST SURGERY     had lumped removed at age 57. non cancer  . CESAREAN SECTION     C/S x 1  . CESAREAN SECTION N/A 04/19/2015   Procedure: CESAREAN SECTION;  Surgeon: Adam Phenix, MD;  Location: [facility];  Service: Obstetrics;  Laterality: N/A;   Family History:  Family History  Problem Relation Age of Onset  . Hypertension Maternal Aunt   . Cancer Maternal Aunt   . Heart disease Maternal Grandmother    Family Psychiatric  History: Reviewed. None noted in chart.  Social History:  History  Alcohol Use No     History  Drug Use  . Types: Marijuana    Comment: quit 2015    Social History   Social History  . Marital status: Married    Spouse name: N/A  . Number of children: N/A  . Years of education: N/A   Social History Main Topics  . Smoking status: Former Games developer  . Smokeless tobacco: Never Used  . Alcohol use No  . Drug use: Yes    Types: Marijuana     Comment: quit 2015  . Sexual activity: Yes    Birth control/ protection: None   Other Topics Concern  . None   Social History Narrative  . None    Hospital Course:  Patient is a 36 year old African-American female who was admitted due to having hallucination, crying spells, homicidal thoughts and consider herself as a failure.  In the beginning she felt that she may have allergic reaction to vinegar but she had  a history of bipolar disorder when she was in [employer].  She admitted noncompliant with medication.  As 2 years due to unable to afford.  Her major stressor is living situation.  She is homeless.  She is unemployed.  Both she believe her husband is very supportive but sometimes she gets very upset and have homicidal thoughts towards him.  Together they have 3 children were 88, 66 and 50 years old.  They've been living in different places after they lost their home.  Patient told recently she's been very depressed and  having bad thoughts in her head.  She is having auditory and visual hallucinations telling that she is not a good mother.  Voices telling him to join her parents were deceased.  She is seeing bugs crawling and she feels people following her.  She also seeing spider and wrapped were following her.  She is easily forgetful.  She used to take Seroquel and trazodone for her manic symptoms.  Patient is complaining of poor sleep, irritability, mood swing, crying spells with lack of motivation to do things.  She is withdrawn, isolated, irritable with decreased attention and concentration.  She has excessive guilt and hopelessness.  Patient has history of hypertension while she was pregnant and she used to take blood thinner for history of blood clot.  However she stopped taking blood pressure medication and Coumadin.  Patient positive for cannabis.  She has multiple health issues including arthritis, migraine.  Patient currently not seeing any psychiatrist.  Patient endorse history of suicidal attempt by taking overdose 4 years ago but never seek any treatment.  In the unit she was calm but tearful and withdrawn.  After the above admission assessment and during this hospital course, patients presenting symptoms were identified. Labs were reviewed and her UDS was positive for THC. Provided substance abuse counseling for cannabis use. No detoxification treatments were required. Patient was admitted for hallucinations and homicidal thoughts. Patient was treated and discharged with the following medications; Depakote 250 mg po tid for mood sx. Depakote level 89 as of 06/02/2017, Lunesta 2 mg po qhs for insomnia, Prazosin to 1 mg po qhs for nightmares, Lisinopril 10 mg po daily for elevated BP, Zyprexa 5 mg po tid for mood sx and augment Depakote. She remained compliant with therapeutic milieu and actively participated in group counseling sessions. While on the unit, patient was able to verbalize learned coping skills for  better management of depression and suicidal thoughts prior to her discharge home.   During the course of her hospitalization, improvement was monitored by observation and Grabiela's daily  report of symptom reduction, presentation of good affect, and overall improvement in mood & behavior. Besides treatment for mood stabilization, patient received medication regimen for skin lesions in which Prednisone 40 mg x4 doses was provided and Triamcinolone 0.5 % for topical use for skin issues  which patient was taking at home. Patient completed the dose of Prednisone during this hospital course.Patient tolerated her treatment regimen without any adverse effects reported.  Ensley's case was presented during treatment team meeting this morning. The team members were all in agreement that Glendola was both mentally & medically stable to be discharged to continue mental health care on an outpatient basis as noted below. She was provided with all the necessary information needed to make this appointment without problems.  Upon discharge, Bambie  denied any SI/HI, AVH, delusional thoughts, or paranoia. She denied  any substance withdrawal symptoms. She was provided with prescriptions  of  her [facility] discharge medications to be taken to her phamacy. She left [facility] with all personal belongings in no apparent distress. Transportation per patients arrangement.  Physical Findings: AIMS: Facial and Oral Movements Muscles of Facial Expression: None, normal Lips and Perioral Area: None, normal Jaw: None, normal Tongue: None, normal,Extremity Movements Upper (arms, wrists, hands, fingers): None, normal Lower (legs, knees, ankles, toes): None, normal, Trunk Movements Neck, shoulders, hips: None, normal, Overall Severity Severity of abnormal movements (highest score from questions above): None, normal Incapacitation due to abnormal movements: None, normal Patient's awareness of abnormal movements (rate only patient's report):  No Awareness, Dental Status Current problems with teeth and/or dentures?: No Does patient usually wear dentures?: No  CIWA:  CIWA-Ar Total: 1 COWS:  COWS Total Score: 2  Musculoskeletal: Strength & Muscle Tone: within normal limits Gait & Station: normal Patient leans: N/A  Psychiatric Specialty Exam: SEE SRA BY MD Physical Exam  Nursing note and vitals reviewed. Constitutional: She is oriented to person, place, and time.  Neurological: She is alert and oriented to person, place, and time.    Review of Systems  Psychiatric/Behavioral: Positive for depression (stable) and substance abuse (Hx of substance abuse ). Negative for hallucinations, memory loss and suicidal ideas (Hx of substance abuse ). The patient is nervous/anxious (stable) and has insomnia (stable ).   All other systems reviewed and are negative.   Blood pressure (!) 120/98, pulse (!) 116, temperature 97.9 F (36.6 C), temperature source Oral, resp. rate 20, height 5' 10.47" (1.79 m), weight 212 lb (96.2 kg), currently breastfeeding.Body mass index is 30.01 kg/m.   Have you used any form of tobacco in the last 30 days? (Cigarettes, Smokeless Tobacco, Cigars, and/or Pipes): No  Has this patient used any form of tobacco in the last 30 days? (Cigarettes, Smokeless Tobacco, Cigars, and/or Pipes)  No  Blood Alcohol level:  No results found for: [facility]  Metabolic Disorder Labs:  Lab Results  Component Value Date   HGBA1C 5.4 05/27/2017   MPG 108 05/27/2017   MPG 105 07/25/2010   Lab Results  Component Value Date   PROLACTIN 42.1 (H) 05/30/2017   Lab Results  Component Value Date   CHOL 153 05/30/2017   TRIG 144 05/30/2017   HDL 62 05/30/2017   CHOLHDL 2.5 05/30/2017   VLDL 29 05/30/2017   LDLCALC 62 05/30/2017   LDLCALC  07/25/2010    82        Total Cholesterol/HDL:CHD Risk Coronary Heart Disease Risk Table                     Men   Women  1/2 Average Risk   3.4   3.3  Average Risk       5.0   4.4  2 X  Average Risk   9.6   7.1  3 X Average Risk  23.4   11.0        Use the calculated Patient Ratio above and the CHD Risk Table to determine the patient's CHD Risk.        ATP III CLASSIFICATION (LDL):  <100     mg/dL   Optimal  409-811  mg/dL   Near or Above                    Optimal  130-159  mg/dL   Borderline  914-782  mg/dL   High  >956     mg/dL   Very High  See Psychiatric Specialty Exam and Suicide Risk Assessment completed by Attending Physician prior to discharge.  Discharge destination:  Home  Is patient on multiple antipsychotic therapies at discharge:  No   Has Patient had three or more failed trials of antipsychotic monotherapy by history:  No  Recommended Plan for Multiple Antipsychotic Therapies: NA   Allergies as of 06/02/2017      Reactions   Garlic Anaphylaxis   Ibuprofen Anaphylaxis, Hives   Latex Hives   Peanut-containing Drug Products Anaphylaxis, Itching   Shellfish Allergy Anaphylaxis   Vinegar [acetic Acid] Anaphylaxis   Aspirin Other (See Comments)    Causes blood not to clot.   Banana Hives   Benadryl [diphenhydramine] Other (See Comments)   It "increases" the pts "allergic reaction response.Makes it happen faster." (Pill form)   Chocolate Other (See Comments)   Causes migraines   Codeine Hives   Eggs Or Egg-derived Products Hives, Swelling   Iodine Hives   Penicillins Hives, Itching   Has patient had a PCN reaction causing immediate rash, facial/tongue/throat swelling, SOB or lightheadedness with hypotension: No Has patient had a PCN reaction causing severe rash involving mucus membranes or skin necrosis: No Has patient had a PCN reaction that required hospitalization: No Has patient had a PCN reaction occurring within the last 10 years: No If all of the above answers are "NO", then may proceed with Cephalosporin use.   Strawberry Extract Hives   Tylenol [acetaminophen] Hives   Citrus Rash   Pineapple Rash      Medication List     STOP taking these medications   enalapril 20 MG tablet Commonly known as:  VASOTEC   warfarin 5 MG tablet Commonly known as:  COUMADIN     TAKE these medications     Indication  antiseptic oral rinse Liqd 15 mLs by Mouth Rinse route as needed for dry mouth.  Indication:  dry mouth   asenapine 5 MG Subl 24 hr tablet Commonly known as:  SAPHRIS Place 1 tablet (5 mg total) under the tongue 2 (two) times daily as needed (SEVERE ANXIETY/AGITATION).  Indication:  sever anxiety   divalproex 250 MG DR tablet Commonly known as:  DEPAKOTE Take 1 tablet (250 mg total) by mouth 2 (two) times daily and at bedtime. For mood stabilization  Indication:  mood disorder   eszopiclone 2 MG Tabs tablet Commonly known as:  LUNESTA Take 1 tablet (2 mg total) by mouth at bedtime. Take immediately before bedtime for insomnia  Indication:  insomnia   ferrous sulfate 325 (65 FE) MG tablet Take 1 tablet (325 mg total) by mouth 2 (two) times daily with a meal. For iron supplementation  Indication:  Iron Deficiency   hydrOXYzine 25 MG tablet Commonly known as:  ATARAX/VISTARIL Take 1 tablet (25 mg total) by mouth 3 (three) times daily as needed for anxiety.  Indication:  Anxiety Neurosis   lisinopril 10 MG tablet Commonly known as:  PRINIVIL,ZESTRIL Take 1 tablet (10 mg total) by mouth daily. For elevated blood pressure Start taking on:  06/03/2017  Indication:  High Blood Pressure Disorder   OLANZapine 5 MG tablet Commonly known as:  ZYPREXA Take 1 tablet (5 mg total) by mouth 3 (three) times daily at 8am, 3pm and bedtime. For mood stabilization  Indication:  mood stabilization   prazosin 1 MG capsule Commonly known as:  MINIPRESS Take 1 capsule (1 mg total) by mouth at bedtime. For nightmares  Indication:  nightmares   traZODone 50 MG  tablet Commonly known as:  DESYREL Take 1 tablet (50 mg total) by mouth at bedtime as needed for sleep.  Indication:  Trouble Sleeping      Follow-up  Information    [facility] Follow up on 06/06/2017.   Specialty:  Behavioral Health Why:  Tuesday at 8AM for your hospital follow up appointment Reggie Brooke Dare is your TCT worker.Marland Kitchen He is glad to help you with transportation. He can be reached at [phone] information: [address] [place] [address] [phone]           Follow-up recommendations:  Follow up with your outpatient provided for any medical issues. Activity & diet as recommended by your primary care provider.  Comments:  Patient is instructed prior to discharge to: Take all medications as prescribed by his/her mental healthcare provider. Report any adverse effects and or reactions from the medicines to his/her outpatient provider promptly. Patient has been instructed & cautioned: To not engage in alcohol and or illegal drug use while on prescription medicines. In the event of worsening symptoms, patient is instructed to call the crisis hotline, 911 and or go to the nearest ED for appropriate evaluation and treatment of symptoms. To follow-up with his/her primary care provider for your other medical issues, concerns and or health care needs.  Signed: Denzil Magnuson, NP 06/02/2017, 11:27 AM   Patient seen, interviewed for this psychiatric assessment, case discussed with physician extender and formulated safe disposition plan. Completed the charge suicide risk assessment.Reviewed the information documented and agree with the treatment plan.  [facility] 06/03/2017 12:38 PM

## 2017-06-02 NOTE — Progress Notes (Signed)
Recreation Therapy Notes  Date: 06/02/17 Time: 1000 Location: 500 Hall Dayroom  Group Topic: Wellness  Goal Area(s) Addresses:  Patient will define components of whole wellness. Patient will verbalize benefit of whole wellness.  Behavioral Response: Engaged  Intervention:  UnitedHealthBeach ball, chairs  Activity: [employer]or[employer] Volleyball.  Patients were placed in a circle and given one beach ball.  Patients were to pass the ball to each other without it coming to a complete stop.  Patients could bounce the ball off of the floor but had to remain in motion.  LRT would count the number of hits on the ball.  If the ball came to a stop, the count would start over.  Education: Wellness, Building control surveyorDischarge Planning.   Education Outcome: Acknowledges education/In group clarification offered/Needs additional education.   Clinical Observations/Feedback: Pt was very engaged and social during group.  Pt stated the activity required teamwork, coordination and problem solving.  Pt also stated she had a good time in group and would use this activity with her children.   Caroll RancherMarjette Deago Burruss, LRT/CTRS         Caroll RancherLindsay, Malikiah Debarr A 06/02/2017 11:54 AM

## 2017-06-02 NOTE — Plan of Care (Signed)
Problem: [facility] Participation in Recreation Therapeutic Interventions Goal: STG-Patient will identify at least five coping skills for ** STG: Coping Skills - Patient will be able to identify at least 5 coping skills for anger by conclusion of recreation therapy tx  Outcome: Completed/Met Date Met: 06/02/17 Pt was able to identify coping skills at completion of coping skills and leisure education recreation therapy sessions.  Victorino Sparrow, LRT/CTRS

## 2017-06-02 NOTE — Progress Notes (Signed)
D: Pt at the time of assessment was in bed resting with eye closed. Pt at the time complained of moderate anxiety; states, "I will be leaving tomorrow, just a little anxious." Pt denied SI, HI, depression, pain, SI, HI or AVH. Pt remained calm and cooperative. A: Medications offered as prescribed. All patient's questions and concerns addressed. Support, encouragement, and safe environment provided.15-minute safety checks continue. R: Pt was med compliant. Pt attended karaoke group. Safety checks continue.

## 2017-06-02 NOTE — Progress Notes (Signed)
  [facility] Dis[facility] :  Will you be returning to the same living situation after discharge:  Yes,  home At discharge, do you have transportation home?: Yes,  family Do you have the ability to pay for your medications: Yes,  MCD  Release of information consent forms completed and in the chart;  Patient's signature needed at discharge.  Patient to Follow up at: Follow-up Information    [facility] Follow up on 06/06/2017.   Specialty:  Behavioral Health Why:  Tuesday at 8AM for your hospital follow up appointment Reggie Brooke DareKing is your TCT worker.Marland Kitchen. He is glad to help you with transportation. He can be reached at [phone]  Contact information: [address] [place] [address] [phone]           Next level of care provider has access to [facility] Link:no  Safety Planning and Suicide Prevention discussed: Yes,  yes  Have you used any form of tobacco in the last 30 days? (Cigarettes, Smokeless Tobacco, Cigars, and/or Pipes): No  Has patient been referred to the Quitline?: N/A patient is not a smoker  Patient has been referred for addiction treatment: Yes  Ida RogueRodney B Ambrosio Reuter 06/02/2017, 10:44 AM

## 2017-06-02 NOTE — Progress Notes (Signed)
Patient ID: Danielle Estes, female   DOB: 11/22/1981, 36 y.o.   MRN: [id number] Patient discharged to home/self care in the company of her husband.  Patient acknowledged understanding of all discharge instructions and receipt of personal belongings. Patient stated she is ready to return home and is motivated to continue her treatment outside of the hospital.

## 2017-07-14 ENCOUNTER — Ambulatory Visit (INDEPENDENT_AMBULATORY_CARE_PROVIDER_SITE_OTHER): Payer: Medicaid Other | Admitting: Family Medicine

## 2017-07-14 ENCOUNTER — Encounter: Payer: Self-pay | Admitting: Family Medicine

## 2017-07-14 VITALS — BP 130/100 | HR 76 | Temp 98.6°F | Ht 70.0 in | Wt 220.0 lb

## 2017-07-14 DIAGNOSIS — Z7689 Persons encountering health services in other specified circumstances: Secondary | ICD-10-CM | POA: Diagnosis present

## 2017-07-14 DIAGNOSIS — I10 Essential (primary) hypertension: Secondary | ICD-10-CM | POA: Diagnosis not present

## 2017-07-14 MED ORDER — LISINOPRIL 10 MG PO TABS: 10.0000 mg | ORAL_TABLET | Freq: Every day | ORAL | 3 refills | Status: DC

## 2017-07-14 NOTE — Progress Notes (Signed)
   Subjective:   Patient ID: Danielle Estes    DOB: 07/28/1981, 36 y.o. female   MRN: [id number]  CC: new patient visit   HPI: Danielle Estes is a 36 y.o. female who presents to clinic today to establish care with me as her new PCP.    Danielle Estes was born [place] raised in [place], [place].  She had served 8 years in [employer] --  [employer] then [employer].  [place]oved back to [place] after [place].  She has 5 children -- lives at home with her husband and 3 of her children.  The oldest child stays with his biological mother and another one has moved out to stay with Danielle Estes's uncle.     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  Last year was her first year doing this unfortunately due to her children  being bullied in school by their teacher.  She notes that this has been a tremendous amount of work for her and has caused additional stress however she enjoys what she now does as it has helped her learn a lot about her children and their learning patterns.  She feels grateful to be able to see them grow which she otherwise would not see if they were in school eight hours a day.   Additionally, she needs refill on her Lisinopril for HTN.   She was recently started on it when she left Behavioral Health in June 2018.  Has a history of bipolar disorder and PTSD. Denies symptoms of depression, denies SI/HI.  Feels as though she has been doing well ever since.     ROS: See HPI for pertinent ROS. PMFSH: Pertinent past medical, surgical, family, and social history were reviewed and updated as appropriate. Smoking status reviewed. Pt is a never smoker.  Endorses some THC use but a long time ago.  No alcohol use.  Medications reviewed. Objective:   BP (!) 130/100   Pulse 76   Temp 98.6 F (37 C) (Oral)   Ht 5\' 10"  (1.778 m)   Wt 220 lb (99.8 kg)   LMP 06/22/2017   SpO2 97%   BMI 31.57 kg/m  Vitals and nursing note reviewed.  General: 36 yo female, no acute distress HEENT:  normal CV: regular rate and rhythm without murmurs rubs or gallops Lungs: clear to auscultation bilaterally with normal work of breathing Psych: normal mood and affect   Assessment & Plan:   Chronic hypertension BP at today's visit 130/100, currently taking Lisinopril 10 mg daily.  -Refill provided  -Will continue to monitor   -Discussed importance health maintenance issues today and patient will follow up to address these.    Meds ordered this encounter  Medications  . lisinopril (PRINIVIL,ZESTRIL) 10 MG tablet    Sig: Take 1 tablet (10 mg total) by mouth daily. For elevated blood pressure    Dispense:  30 tablet    Refill:  3   Follow up: as needed   Freddrick MarchYashika Brinae Woods, MD [facility], PGY-1 07/16/2017 2:37 PM

## 2017-07-14 NOTE — Patient Instructions (Signed)
It was really nice meeting you! You were seen in clinic today to establish care with me as your new PCP.  I had a great time getting to know you and look forward to the next visit.  Please call clinic with any questions.   Be well,  Freddrick MarchYashika Raford Brissett, MD

## 2017-07-16 ENCOUNTER — Encounter: Payer: Self-pay | Admitting: Family Medicine

## 2017-07-16 DIAGNOSIS — G43909 Migraine, unspecified, not intractable, without status migrainosus: Secondary | ICD-10-CM | POA: Insufficient documentation

## 2017-07-16 NOTE — Assessment & Plan Note (Signed)
BP at today's visit 130/100, currently taking Lisinopril 10 mg daily.  -Refill provided  -Will continue to monitor

## 2018-05-29 ENCOUNTER — Ambulatory Visit: Payer: Non-veteran care | Admitting: Family Medicine

## 2019-07-07 ENCOUNTER — Encounter (HOSPITAL_COMMUNITY): Payer: Self-pay | Admitting: Emergency Medicine

## 2019-07-07 ENCOUNTER — Emergency Department (HOSPITAL_COMMUNITY)
Admission: EM | Admit: 2019-07-07 | Discharge: 2019-07-07 | Disposition: A | Discharge status: Home / Self Care | Payer: No Typology Code available for payment source | Attending: Emergency Medicine | Admitting: Emergency Medicine

## 2019-07-07 DIAGNOSIS — Z91010 Allergy to peanuts: Secondary | ICD-10-CM | POA: Diagnosis not present

## 2019-07-07 DIAGNOSIS — F3131 Bipolar disorder, current episode depressed, mild: Secondary | ICD-10-CM | POA: Diagnosis not present

## 2019-07-07 DIAGNOSIS — Z79899 Other long term (current) drug therapy: Secondary | ICD-10-CM | POA: Diagnosis not present

## 2019-07-07 DIAGNOSIS — Z91040 Latex allergy status: Secondary | ICD-10-CM | POA: Diagnosis not present

## 2019-07-07 DIAGNOSIS — R456 Violent behavior: Secondary | ICD-10-CM | POA: Diagnosis present

## 2019-07-07 DIAGNOSIS — Z87891 Personal history of nicotine dependence: Secondary | ICD-10-CM | POA: Diagnosis not present

## 2019-07-07 DIAGNOSIS — I2699 Other pulmonary embolism without acute cor pulmonale: Secondary | ICD-10-CM | POA: Insufficient documentation

## 2019-07-07 DIAGNOSIS — R4689 Other symptoms and signs involving appearance and behavior: Secondary | ICD-10-CM

## 2019-07-07 DIAGNOSIS — E876 Hypokalemia: Secondary | ICD-10-CM | POA: Diagnosis not present

## 2019-07-07 DIAGNOSIS — I10 Essential (primary) hypertension: Secondary | ICD-10-CM | POA: Insufficient documentation

## 2019-07-07 LAB — COMPREHENSIVE METABOLIC PANEL
ALT: 27 U/L (ref 0–44)
AST: 23 U/L (ref 15–41)
Albumin: 4.6 g/dL (ref 3.5–5.0)
Alkaline Phosphatase: 77 U/L (ref 38–126)
Anion gap: 14 (ref 5–15)
BUN: 9 mg/dL (ref 6–20)
CO2: 18 mmol/L — ABNORMAL LOW (ref 22–32)
Calcium: 9.8 mg/dL (ref 8.9–10.3)
Chloride: 111 mmol/L (ref 98–111)
Creatinine, Ser: 0.81 mg/dL (ref 0.44–1.00)
GFR calc Af Amer: >60 mL/min (ref >60)
GFR calc non Af Amer: >60 mL/min (ref >60)
Glucose, Bld: 138 mg/dL — ABNORMAL HIGH (ref 70–99)
Potassium: 3 mmol/L — ABNORMAL LOW (ref 3.5–5.1)
Sodium: 143 mmol/L (ref 135–145)
Total Bilirubin: 0.8 mg/dL (ref 0.3–1.2)
Total Protein: 8 g/dL (ref 6.5–8.1)

## 2019-07-07 LAB — ACETAMINOPHEN LEVEL: Acetaminophen (Tylenol), Serum: <10 ug/mL — ABNORMAL LOW (ref 10–30)

## 2019-07-07 LAB — RAPID URINE DRUG SCREEN, HOSP PERFORMED
Amphetamines: NOT DETECTED
Barbiturates: NOT DETECTED
Benzodiazepines: NOT DETECTED
Cocaine: NOT DETECTED
Opiates: NOT DETECTED
Tetrahydrocannabinol: POSITIVE — AB

## 2019-07-07 LAB — I-STAT BETA HCG BLOOD, ED (MC, WL, AP ONLY): I-stat hCG, quantitative: <5 m[IU]/mL (ref <5)

## 2019-07-07 LAB — CBC
HCT: 36.4 % (ref 36.0–46.0)
Hemoglobin: 10 g/dL — ABNORMAL LOW (ref 12.0–15.0)
MCH: 20 pg — ABNORMAL LOW (ref 26.0–34.0)
MCHC: 27.5 g/dL — ABNORMAL LOW (ref 30.0–36.0)
MCV: 72.7 fL — ABNORMAL LOW (ref 80.0–100.0)
Platelets: 530 10*3/uL — ABNORMAL HIGH (ref 150–400)
RBC: 5.01 MIL/uL (ref 3.87–5.11)
RDW: 19.9 % — ABNORMAL HIGH (ref 11.5–15.5)
WBC: 6.5 10*3/uL (ref 4.0–10.5)
nRBC: 0 % (ref 0.0–0.2)

## 2019-07-07 LAB — SALICYLATE LEVEL: Salicylate Lvl: <7 mg/dL (ref 2.8–30.0)

## 2019-07-07 LAB — ETHANOL: Alcohol, Ethyl (B): <10 mg/dL (ref <10)

## 2019-07-07 MED ORDER — POTASSIUM CHLORIDE CRYS ER 20 MEQ PO TBCR
40.0000 meq | EXTENDED_RELEASE_TABLET | Freq: Once | ORAL | Status: AC
  Administered 2019-07-07: 40 meq via ORAL
  Filled 2019-07-07: qty 2

## 2019-07-07 NOTE — ED Notes (Signed)
Belongings and valuables returned to pt 

## 2019-07-07 NOTE — ED Notes (Signed)
Pt is IVC'd and has been served.

## 2019-07-07 NOTE — ED Notes (Signed)
Pt highly uncooperative. Refusing EKG, did allow [place] to get repeat vital signs.  Belongings removed, placed in paper scrubs. Wanded by security.

## 2019-07-07 NOTE — ED Notes (Signed)
Pt has refused blood work.  
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
## 2019-07-07 NOTE — ED Notes (Signed)
Per TTS pt meets DC criteria. IVC paperwork to be rescinded by Merleen Nicely PA

## 2019-07-07 NOTE — ED Notes (Signed)
Belongings inventoried, placed in [place] [address]3. Valuables (phone) given to security.

## 2019-07-07 NOTE — ED Notes (Signed)
Pt phone temporarily returned to pt, TTS is requesting a contact from her phone

## 2019-07-07 NOTE — ED Notes (Signed)
TTS bedside 
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
## 2019-07-07 NOTE — ED Notes (Signed)
Extended conversation with patient regarding outside resources. SANE consulted, # for [facility] provided. Pt apologetic for initial behavior upon arrival. Cab voucher called for patient.  Pt reports her and her children will be safe tonight.

## 2019-07-07 NOTE — Discharge Instructions (Signed)
Your IVC was rescinded.  Your blood work did show that your potassium was low today you were given some potassium replacement but please increase potassium in your diet with things like bananas, pickles or mustard.  Please continue taking all medications as you are supposed to.  You can follow-up at the walk-in clinic at [facility] if you need any refills of your psychiatric medications, or follow-up with your primary care doctor.
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
## 2020-09-29 ENCOUNTER — Ambulatory Visit (INDEPENDENT_AMBULATORY_CARE_PROVIDER_SITE_OTHER): Payer: No Typology Code available for payment source | Admitting: Allergy & Immunology

## 2020-09-29 ENCOUNTER — Other Ambulatory Visit: Payer: Self-pay

## 2020-09-29 ENCOUNTER — Encounter: Payer: Self-pay | Admitting: Allergy & Immunology

## 2020-09-29 VITALS — BP 160/96 | HR 98 | Temp 98.5°F | Resp 18 | Ht 72.0 in | Wt 220.0 lb

## 2020-09-29 DIAGNOSIS — L2089 Other atopic dermatitis: Secondary | ICD-10-CM

## 2020-09-29 DIAGNOSIS — J310 Chronic rhinitis: Secondary | ICD-10-CM | POA: Diagnosis not present

## 2020-09-29 DIAGNOSIS — G43909 Migraine, unspecified, not intractable, without status migrainosus: Secondary | ICD-10-CM | POA: Diagnosis not present

## 2020-09-29 DIAGNOSIS — T7840XD Allergy, unspecified, subsequent encounter: Secondary | ICD-10-CM | POA: Diagnosis not present

## 2020-09-29 MED ORDER — TRIAMCINOLONE ACETONIDE 0.1 % EX OINT: 1.0000 "application " | TOPICAL_OINTMENT | Freq: Two times a day (BID) | CUTANEOUS | 5 refills | Status: AC
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
## 2020-09-29 NOTE — Patient Instructions (Addendum)
1. Allergic reaction - It seems that you will no longer need iron after your surgery, so I think that this is a moot point. - I would try using Zyrtec (cetirizine) for treatment of future reactions.  - Continue to avoid Benadryl. - I do not think that testing is needed at this time.   2. Allergic rhinitis with migraines - Consider seeing another Neurologist for an evaluation. - It seems that you have a lot of good methods for dealing with these symptoms. - It is a good thing that you have a supportive family.   3. Eczema  -  Add on triamcinolone 0.1% ointment twice daily to the worst areas .  4. Return in about 6 months (around 03/30/2021).   Please inform us of any Emergency Department visits, hospitalizations, or changes in symptoms. Call us before going to the ED for breathing or allergy symptoms since we might be able to fit you in for a sick visit. Feel free to contact us anytime with any questions, problems, or concerns.  It was a pleasure to meet you today!  Websites that have reliable patient information: 1. American Academy of Asthma, Allergy, and Immunology: www.aaaai.org 2. Food Allergy Research and Education (FARE): foodallergy.org 3. Mothers of Asthmatics: http://www.asthmacommunitynetwork.org 4. American College of Allergy, Asthma, and Immunology: www.acaai.org   COVID-19 Vaccine Information can be found at: PodExchange.nl For questions related to vaccine distribution or appointments, please email [email or web address] or call [phone].     "Like" [place] on Facebook and Instagram for our latest updates!     HAPPY FALL!     Make sure you are registered to vote! If you have moved or changed any of your contact information, you will need to get this updated before voting!  In some cases, you MAY be able to register to vote online: [email or web address]

## 2020-10-01 ENCOUNTER — Encounter: Payer: Self-pay | Admitting: Allergy & Immunology

## 2021-03-30 ENCOUNTER — Ambulatory Visit: Payer: No Typology Code available for payment source | Admitting: Allergy & Immunology

## 2021-06-03 ENCOUNTER — Ambulatory Visit (INDEPENDENT_AMBULATORY_CARE_PROVIDER_SITE_OTHER): Payer: No Typology Code available for payment source | Admitting: Family Medicine

## 2021-06-03 ENCOUNTER — Other Ambulatory Visit: Payer: Self-pay

## 2021-06-03 ENCOUNTER — Encounter: Payer: Self-pay | Admitting: Family Medicine

## 2021-06-03 VITALS — BP 160/100 | HR 77 | Temp 98.2°F | Resp 16 | Ht 72.0 in | Wt 228.6 lb

## 2021-06-03 DIAGNOSIS — L2084 Intrinsic (allergic) eczema: Secondary | ICD-10-CM

## 2021-06-03 DIAGNOSIS — L2089 Other atopic dermatitis: Secondary | ICD-10-CM | POA: Diagnosis not present

## 2021-06-03 DIAGNOSIS — J310 Chronic rhinitis: Secondary | ICD-10-CM | POA: Diagnosis not present

## 2021-06-03 DIAGNOSIS — H1010 Acute atopic conjunctivitis, unspecified eye: Secondary | ICD-10-CM

## 2021-06-03 DIAGNOSIS — L500 Allergic urticaria: Secondary | ICD-10-CM | POA: Diagnosis not present

## 2021-06-03 DIAGNOSIS — H1013 Acute atopic conjunctivitis, bilateral: Secondary | ICD-10-CM

## 2021-06-03 MED ORDER — FLUTICASONE PROPIONATE 50 MCG/ACT NA SUSP: 2.0000 | Freq: Every day | NASAL | 5 refills | Status: AC | PRN

## 2021-06-03 MED ORDER — LEVOCETIRIZINE DIHYDROCHLORIDE 5 MG PO TABS: 5.0000 mg | ORAL_TABLET | Freq: Every evening | ORAL | 5 refills | Status: AC

## 2021-06-03 MED ORDER — OLOPATADINE HCL 0.2 % OP SOLN: 1.0000 [drp] | Freq: Every day | OPHTHALMIC | 5 refills | Status: DC | PRN

## 2021-06-03 MED ORDER — MOMETASONE FUROATE 0.1 % EX OINT: TOPICAL_OINTMENT | Freq: Every day | CUTANEOUS | 5 refills | Status: AC

## 2021-06-03 MED ORDER — FAMOTIDINE 20 MG PO TABS: ORAL_TABLET | ORAL | 5 refills | Status: AC

## 2021-06-03 NOTE — Patient Instructions (Addendum)
Allergic rhinitis Begin prednisone 10 mg tablets. Take 2 tablets twice a day for 3 days, then take 2 tablets once a day for 1 day, then take 1 tablet on the 5th day, then stop Begin Xyzal 5 mg once a day as needed for itch or runny nose.Remember to rotate to a different antihistamine about every 3 months. Some examples of over the counter antihistamines include Zyrtec (cetirizine), Xyzal (levocetirizine), Allegra (fexofenadine), and Claritin (loratidine).  Begin Flonase 2 sprays in each nostril once a day as needed for stuffy nose.  In the right nostril, point the applicator out toward the right ear. In the left nostril, point the applicator out toward the left ear Consider saline nasal rinses as needed for nasal symptoms. Use this before any medicated nasal sprays for best result If medications do not control your symptoms, consider a course of allergen immunotherapy.  Allergic conjunctivitis Begin Pataday 1 drop in each eye once a day as needed for red or itchy eyes.  You may keep these in the refrigerator to provide extra relief and avoid burning  Atopic dermatitis Continue a twice a day moisturizing routine To red itchy areas below your face begin mometasone 0.1% ointment once a day as needed.  Do not use this medication for longer than 2 weeks  Hives Use the lease amount of medications while remaining hive free Levocetirizine (Xyzal) 5 mg twice a day and famotidine (Pepcid) 20 mg twice a day. If no symptoms for 7-14 days then decrease to. Levocetirizine (Xyzal) 5 mg twice a day and famotidine (Pepcid) 20 mg once a day.  If no symptoms for 7-14 days then decrease to. Levocetirizine (Xyzal) 5 mg twice a day.  If no symptoms for 7-14 days then decrease to. Levocetirizine (Xyzal) 5 mg once a day.   Call the clinic if this treatment plan is not working well for you  Follow up in 2 months or sooner if needed.  Control of Dog or Cat Allergen Avoidance is the best way to manage a dog or cat  allergy. If you have a dog or cat and are allergic to dog or cats, consider removing the dog or cat from the home. If you have a dog or cat but don't want to find it a new home, or if your family wants a pet even though someone in the household is allergic, here are some strategies that may help keep symptoms at bay:  Keep the pet out of your bedroom and restrict it to only a few rooms. Be advised that keeping the dog or cat in only one room will not limit the allergens to that room. Don't pet, hug or kiss the dog or cat; if you do, wash your hands with soap and water. High-efficiency particulate air (HEPA) cleaners run continuously in a bedroom or living room can reduce allergen levels over time. Regular use of a high-efficiency vacuum cleaner or a central vacuum can reduce allergen levels. Giving your dog or cat a bath at least once a week can reduce airborne allergen.

## 2021-06-03 NOTE — Progress Notes (Signed)
[address] [place] [place] [address] Dept: [phone]  FOLLOW UP NOTE  Patient ID: Danielle Estes, female    DOB: August 29, 1981  Age: 40 y.o. MRN: [id number] Date of Office Visit: 06/03/2021  Assessment  Chief Complaint: Allergic Reaction (Got a puppy and he stays in the house. She has swelling in her eyes, face and congestion. Her face is red and painful.No on any antihistamines. She has an allergy to a lot of medications including epi pens. ) and Eczema  HPI Danielle Estes is a 40 year old female who presents the clinic for evaluation of allergies.  She was last seen in this clinic on 09/29/2020 by Dr. Dellis Anes for evaluation of a an allergic reaction occurring during an iron transfusion with Benadryl as part of the pretreatment, allergic rhinitis with migraine, and atopic dermatitis.  At today's visit, she reports that 1 week ago she brought a new puppy into the home.  Within the first 2 days after the arrival of the puppy she began to experience symptoms of allergic rhinitis and allergic conjunctivitis including runny nose, nasal congestion, sneezing, itchy watery eyes with eye swelling, face burning.  She also began to experience hives and a flare of atopic dermatitis.  Prior to this incident, she reports her allergic rhinitis was moderately well controlled with occasional use of cetirizine or Allegra with mild relief of symptoms.  She is not currently taking an antihistamine, using a nasal steroid spray, or using a saline nasal rinse.  Prior to last week, she reports allergic conjunctivitis has been moderately well controlled with only occasional red or itchy eyes for which she uses clear eyes with moderate relief of symptoms.  At today's visit, she reports she is not currently using an allergy eyedrop.  Atopic dermatitis has been moderately well controlled, prior to this incident, with a daily moisturizing routine using coconut oil.  At today's visit, she reports atopic dermatitis has  flared in areas including bilateral popliteal and bilateral antecubital fossa.  She reports a new symptom of small scattered red, raised, itchy hives occurring on her arms that began several days ago.  She denies concomitant cardiopulmonary or gastrointestinal symptoms with these hives.  Her current medications are listed in the chart.  Drug Allergies:  Allergies  Allergen Reactions   Garlic Anaphylaxis   Ibuprofen Anaphylaxis and Hives   Latex Hives   Peanut-Containing Drug Products Anaphylaxis and Itching   Shellfish Allergy Anaphylaxis   Vinegar [Acetic Acid] Anaphylaxis   Aspirin Other (See Comments)     Causes blood not to clot.   Banana Hives   Benadryl [Diphenhydramine] Other (See Comments)    It "increases" the pts "allergic reaction response.Makes it happen faster." (Pill form)   Chocolate Other (See Comments)    Causes migraines   Codeine Hives   Eggs Or Egg-Derived Products Hives and Swelling   Iodine Hives   Penicillins Hives and Itching    Has patient had a PCN reaction causing immediate rash, facial/tongue/throat swelling, SOB or lightheadedness with hypotension: No Has patient had a PCN reaction causing severe rash involving mucus membranes or skin necrosis: No Has patient had a PCN reaction that required hospitalization: No Has patient had a PCN reaction occurring within the last 10 years: No If all of the above answers are "NO", then may proceed with Cephalosporin use.   Strawberry Extract Hives   Tylenol [Acetaminophen] Hives   Citrus Rash   Pineapple Rash    Physical Exam: BP (!) 160/100  Pulse 77   Temp 98.2 F (36.8 C)   Resp 16   Ht 6' (1.829 m)   Wt 228 lb 9.6 oz (103.7 kg)   SpO2 98%   BMI 31.00 kg/m    Physical Exam Vitals reviewed.  Constitutional:      Appearance: Normal appearance.  HENT:     Head: Normocephalic and atraumatic.     Right Ear: Tympanic membrane normal.     Left Ear: Tympanic membrane normal.     Nose:     Comments:  Bilateral nares edematous and pale with clear nasal drainage noted.  Pharynx is slightly erythematous with no exudate.  Ears normal.  Bilateral eyes with moderate amount of swelling.  Clear drainage noted from the left eye Eyes:     General:        Left eye: Discharge present. Cardiovascular:     Rate and Rhythm: Normal rate and regular rhythm.     Heart sounds: Normal heart sounds. No murmur heard. Pulmonary:     Breath sounds: Normal breath sounds.     Comments: Lungs clear to auscultation Musculoskeletal:        General: Normal range of motion.     Cervical back: Normal range of motion and neck supple.  Skin:    Findings: Rash present.     Comments: Large lichenified eczematous patches noted in bilateral antecubital fossa and popliteal fossa areas.  Scattered pinpoint rash noted on bilateral forearms.  Neurological:     Mental Status: She is alert and oriented to person, place, and time.  Psychiatric:        Mood and Affect: Mood normal.        Behavior: Behavior normal.        Thought Content: Thought content normal.        Judgment: Judgment normal.    Assessment and Plan: 1. Flexural atopic dermatitis   2. Chronic rhinitis   3. Intrinsic atopic dermatitis   4. Allergic urticaria   5. Seasonal allergic conjunctivitis     Meds ordered this encounter  Medications   fluticasone (FLONASE) 50 MCG/ACT nasal spray    Sig: Place 2 sprays into both nostrils daily as needed for allergies or rhinitis.    Dispense:  16 g    Refill:  5   Olopatadine HCl (PATADAY) 0.2 % SOLN    Sig: Place 1 drop into both eyes daily as needed.    Dispense:  2.5 mL    Refill:  5   levocetirizine (XYZAL) 5 MG tablet    Sig: Take 1 tablet (5 mg total) by mouth every evening.    Dispense:  30 tablet    Refill:  5   mometasone (ELOCON) 0.1 % ointment    Sig: Apply topically daily. Apply to red itchy areas below your face once a day as needed. DO NOT USE this medication for longer than 2 weeks     Dispense:  45 g    Refill:  5   famotidine (PEPCID) 20 MG tablet    Sig: Take 1 tablet 1-2 times per day as directed by hives regimen.    Dispense:  60 tablet    Refill:  5     Patient Instructions  Allergic rhinitis Begin prednisone 10 mg tablets. Take 2 tablets twice a day for 3 days, then take 2 tablets once a day for 1 day, then take 1 tablet on the 5th day, then stop Begin Xyzal 5 mg once a day  as needed for itch or runny nose.Remember to rotate to a different antihistamine about every 3 months. Some examples of over the counter antihistamines include Zyrtec (cetirizine), Xyzal (levocetirizine), Allegra (fexofenadine), and Claritin (loratidine).  Begin Flonase 2 sprays in each nostril once a day as needed for stuffy nose.  In the right nostril, point the applicator out toward the right ear. In the left nostril, point the applicator out toward the left ear Consider saline nasal rinses as needed for nasal symptoms. Use this before any medicated nasal sprays for best result If medications do not control your symptoms, consider a course of allergen immunotherapy.  Allergic conjunctivitis Begin Pataday 1 drop in each eye once a day as needed for red or itchy eyes.  You may keep these in the refrigerator to provide extra relief and avoid burning  Atopic dermatitis Continue a twice a day moisturizing routine To red itchy areas below your face begin mometasone 0.1% ointment once a day as needed.  Do not use this medication for longer than 2 weeks  Hives Use the lease amount of medications while remaining hive free Levocetirizine (Xyzal) 5 mg twice a day and famotidine (Pepcid) 20 mg twice a day. If no symptoms for 7-14 days then decrease to. Levocetirizine (Xyzal) 5 mg twice a day and famotidine (Pepcid) 20 mg once a day.  If no symptoms for 7-14 days then decrease to. Levocetirizine (Xyzal) 5 mg twice a day.  If no symptoms for 7-14 days then decrease to. Levocetirizine (Xyzal) 5 mg once a  day.   Your blood pressure was elevated at today's visit.  Follow-up with your primary care provider to assess and treat your blood pressure.  Call the clinic if this treatment plan is not working well for you  Follow up in 2 months or sooner if needed.   Return in about 2 months (around 08/03/2021), or if symptoms worsen or fail to improve.    Thank you for the opportunity to care for this patient.  Please do not hesitate to contact me with questions.  Thermon Leyland, FNP [facility]

## 2021-06-04 ENCOUNTER — Telehealth: Payer: Self-pay | Admitting: Family Medicine

## 2021-06-04 MED ORDER — OLOPATADINE HCL 0.1 % OP SOLN: 1.0000 [drp] | Freq: Two times a day (BID) | OPHTHALMIC | 5 refills | Status: AC | PRN

## 2021-06-04 NOTE — Telephone Encounter (Signed)
Script has been sent to the pharmacy, Left a message for patient to call the office regarding this medication change.

## 2021-06-04 NOTE — Telephone Encounter (Signed)
Can you please send olopatadine 0.1% one drop in each eye twice a day as needed for red or itchy eyes? Please call the patient and let her know of the change. Please ask if she is beginning to feel any better. Thank you

## 2021-06-04 NOTE — Telephone Encounter (Signed)
Thank you :)

## 2021-06-04 NOTE — Telephone Encounter (Signed)
[facility] and states they don't Pataday in the 0.2 they only have 0.1 or 0.7 please advise

## 2021-06-25 ENCOUNTER — Telehealth: Payer: Self-pay | Admitting: Family Medicine

## 2021-06-25 NOTE — Telephone Encounter (Signed)
Please advise to change in rx

## 2021-06-25 NOTE — Telephone Encounter (Signed)
[facility] is stating RX mometasone (ELOCON) 0.1 % ointment [[id number]] is not supported by the [place], can replace this RX with a combination of  augmentin and bethamethasone or if this RX is not replaced with alternate medications detailed above a letter must be written explaining why the patient needs this medication and the [place] pharmacy will attempt to get a PA for this medication (ELOCON).  If a new RX is needed it must be sent in as a new RX cannot be called in to the pharmacy

## 2021-06-25 NOTE — Telephone Encounter (Signed)
Katie please call this patient and ask if she still needs this medication.

## 2021-06-28 NOTE — Telephone Encounter (Signed)
Lm for pt to call us back  

## 2021-08-05 ENCOUNTER — Ambulatory Visit: Payer: No Typology Code available for payment source | Admitting: Allergy & Immunology

## 2021-09-17 ENCOUNTER — Telehealth: Payer: Self-pay | Admitting: Family Medicine

## 2021-09-17 NOTE — Telephone Encounter (Signed)
Left message asking patient if she would like for [place] to request a new authorization.

## 2022-05-31 ENCOUNTER — Ambulatory Visit (HOSPITAL_COMMUNITY)
Admission: EM | Admit: 2022-05-31 | Discharge: 2022-05-31 | Disposition: A | Discharge status: Home / Self Care | Payer: No Typology Code available for payment source | Attending: Emergency Medicine | Admitting: Emergency Medicine

## 2022-05-31 ENCOUNTER — Encounter (HOSPITAL_COMMUNITY): Payer: Self-pay | Admitting: Emergency Medicine

## 2022-05-31 ENCOUNTER — Other Ambulatory Visit: Payer: Self-pay

## 2022-05-31 DIAGNOSIS — M5441 Lumbago with sciatica, right side: Secondary | ICD-10-CM | POA: Diagnosis not present

## 2022-05-31 MED ORDER — METHYLPREDNISOLONE SODIUM SUCC 125 MG IJ SOLR
INTRAMUSCULAR | Status: AC
  Filled 2022-05-31: qty 2

## 2022-05-31 MED ORDER — CYCLOBENZAPRINE HCL 10 MG PO TABS: 10.0000 mg | ORAL_TABLET | Freq: Every day | ORAL | 0 refills | Status: DC

## 2022-05-31 MED ORDER — METHYLPREDNISOLONE SODIUM SUCC 125 MG IJ SOLR
60.0000 mg | Freq: Once | INTRAMUSCULAR | Status: AC
  Administered 2022-05-31: 60 mg via INTRAMUSCULAR

## 2022-05-31 MED ORDER — PREDNISONE 20 MG PO TABS: 40.0000 mg | ORAL_TABLET | Freq: Every day | ORAL | 0 refills | Status: DC

## 2022-05-31 MED ORDER — PREDNISONE 20 MG PO TABS: 40.0000 mg | ORAL_TABLET | Freq: Every day | ORAL | 0 refills | Status: AC

## 2022-05-31 MED ORDER — CYCLOBENZAPRINE HCL 10 MG PO TABS: 10.0000 mg | ORAL_TABLET | Freq: Every day | ORAL | 0 refills | Status: AC

## 2022-05-31 NOTE — ED Provider Notes (Signed)
[facility]    CSN: [id number] Arrival date & time: 05/31/22  1510      History   Chief Complaint Chief Complaint  Patient presents with   Leg Problem    HPI Danielle Estes is a 41 y.o. female.   Patient presents with bilateral lower back pain radiating down the right lower extremity with associated numbness and tingling for 7 days.  Denies precipitating event, trauma or injury, endorses that she was taking a shower when cold water hit her back and symptoms began.  Pain is described as severe, rating a 7 out of 10 described as sharp and shooting.  Painful to bear weight and the leg feels as if it will give out on her.  Has not attempted treatment of symptoms.  Denies urinary or bowel incontinence.    Past Medical History:  Diagnosis Date   Anemia    Angio-edema    Bipolar 1 disorder (HCC)    Depression    Eczema    GERD (gastroesophageal reflux disease)    Headache    Hypertension    Multiple allergies    Pulmonary embolism (HCC)    Urticaria    Vaginal Pap smear, abnormal     Patient Active Problem List   Diagnosis Date Noted   Migraines 07/16/2017   Essential hypertension 05/31/2017   Bipolar disorder, curr episode mixed, severe, with psychotic features (HCC) 05/29/2017   PTSD (post-traumatic stress disorder) 05/29/2017   Pulmonary embolism on right (HCC) 05/10/2015   Cholelithiasis without cholecystitis 05/10/2015   Postoperative anemia due to acute blood loss 05/10/2015   Pre-eclampsia superimposed on chronic hypertension, antepartum 04/18/2015   Chronic hypertension    Dandy Walker Malformation, fetal, affecting care of mother, antepartum    Small for dates affecting management of mother    Supervision of high risk pregnancy in third trimester 01/07/2015   H/O: C-section 01/07/2015   Essential hypertension antepartum    AMA (advanced maternal age) multigravida 35+     Past Surgical History:  Procedure Laterality Date   ABDOMINAL HYSTERECTOMY      BREAST SURGERY     had lumped removed at age 89. non cancer   CESAREAN SECTION     C/S x 1   CESAREAN SECTION N/A 04/19/2015   Procedure: CESAREAN SECTION;  Surgeon: Adam Phenix, MD;  Location: [facility];  Service: Obstetrics;  Laterality: N/A;   TUBAL LIGATION Bilateral 2016    OB History     Gravida  4   Para  4   Term  4   Preterm  0   AB  0   Living  4      SAB  0   IAB  0   Ectopic  0   Multiple  0   Live Births  4            Home Medications    Prior to Admission medications   Medication Sig Start Date End Date Taking? Authorizing Provider  antiseptic oral rinse (BIOTENE) LIQD 15 mLs by Mouth Rinse route as needed for dry mouth. Patient not taking: Reported on 06/03/2021 06/02/17   Denzil Magnuson, NP  asenapine (SAPHRIS) 5 MG SUBL 24 hr tablet Place 1 tablet (5 mg total) under the tongue 2 (two) times daily as needed (SEVERE ANXIETY/AGITATION). Patient not taking: Reported on 06/03/2021 06/02/17   Denzil Magnuson, NP  famotidine (PEPCID) 20 MG tablet Take 1 tablet 1-2 times per day as directed by hives regimen.  06/03/21   Dara Hoyer, FNP  fluticasone (FLONASE) 50 MCG/ACT nasal spray Place 2 sprays into both nostrils daily as needed for allergies or rhinitis. 06/03/21   Dara Hoyer, FNP  levocetirizine (XYZAL) 5 MG tablet Take 1 tablet (5 mg total) by mouth every evening. 06/03/21   Ambs, Kathrine Cords, FNP  mometasone (ELOCON) 0.1 % ointment Apply topically daily. Apply to red itchy areas below your face once a day as needed. DO NOT USE this medication for longer than 2 weeks 06/03/21   Ambs, Kathrine Cords, FNP  olopatadine (PATANOL) 0.1 % ophthalmic solution Place 1 drop into both eyes 2 (two) times daily as needed for allergies. Patient not taking: Reported on 05/31/2022 06/04/21   Dara Hoyer, FNP  prazosin (MINIPRESS) 1 MG capsule Take 1 capsule (1 mg total) by mouth at bedtime. For nightmares Patient not taking: Reported on 06/03/2021 06/02/17   Mordecai Maes,  NP  triamcinolone ointment (KENALOG) 0.1 % Apply 1 application topically 2 (two) times daily. Patient not taking: Reported on 06/03/2021 09/29/20   Valentina Shaggy, MD    Family History Family History  Problem Relation Age of Onset   Hypertension Maternal Aunt    Cancer Maternal Aunt    Heart disease Maternal Grandmother     Social History Social History   Tobacco Use   Smoking status: Former   Smokeless tobacco: Never   Tobacco comments:    Thc  Vaping Use   Vaping Use: Never used  Substance Use Topics   Alcohol use: No   Drug use: Not Currently    Types: Marijuana    Comment: quit 2015     Allergies   Garlic, Ibuprofen, Latex, Peanut-containing drug products, Shellfish allergy, Vinegar [acetic acid], Aspirin, Banana, Benadryl [diphenhydramine], Chocolate, Codeine, Eggs or egg-derived products, Iodine, Penicillins, Strawberry extract, Tylenol [acetaminophen], Citrus, and Pineapple   Review of Systems Review of Systems Defer to HPI    Physical Exam Triage Vital Signs ED Triage Vitals  Enc Vitals Group     BP 05/31/22 1640 (!) 157/97     Pulse Rate 05/31/22 1640 75     Resp 05/31/22 1640 20     Temp 05/31/22 1640 98.8 F (37.1 C)     Temp Source 05/31/22 1640 Oral     SpO2 05/31/22 1640 98 %     Weight --      Height --      Head Circumference --      Peak Flow --      Pain Score 05/31/22 1636 7     Pain Loc --      Pain Edu? --      Excl. in [place]? --    No data found.  Updated Vital Signs BP (!) 157/97 (BP Location: Right Arm) Comment (BP Location): large cuff  Pulse 75   Temp 98.8 F (37.1 C) (Oral)   Resp 20   LMP 06/22/2017   SpO2 98%   Visual Acuity Right Eye Distance:   Left Eye Distance:   Bilateral Distance:    Right Eye Near:   Left Eye Near:    Bilateral Near:     Physical Exam Constitutional:      Appearance: Normal appearance.  HENT:     Head: Normocephalic.  Eyes:     Extraocular Movements: Extraocular movements  intact.  Pulmonary:     Effort: Pulmonary effort is normal.  Musculoskeletal:     Comments: Tenderness is noted throughout the  bilateral lumbar region extending into the right buttocks, no ecchymosis, swelling or deformity noted, able to bear weight but pain is elicited  Neurological:     Mental Status: She is alert and oriented to person, place, and time. Mental status is at baseline.  Psychiatric:        Mood and Affect: Mood normal.        Behavior: Behavior normal.      UC Treatments / Results  Labs (all labs ordered are listed, but only abnormal results are displayed) Labs Reviewed - No data to display  EKG   Radiology No results found.  Procedures Procedures (including critical care time)  Medications Ordered in UC Medications - No data to display  Initial Impression / Assessment and Plan / UC Course  I have reviewed the triage vital signs and the nursing notes.  Pertinent labs & imaging results that were available during my care of the patient were reviewed by me and considered in my medical decision making (see chart for details).  Bilateral low back pain with right-sided sciatica  Discussed etiology of symptoms with patient, reviewed medication allergies and patient endorses that she is able to tolerate prednisone as well as Flexeril without reaction or complications, prescribed for outpatient use and given injection of methylprednisolone in office, recommended RICE, heat, pillows for support, daily stretching and activity as tolerated, given walker referral to orthopedics if symptoms continue to persist, patient [facility], may follow-up at their offices as needed Final Clinical Impressions(s) / UC Diagnoses   Final diagnoses:  None   Discharge Instructions   None    ED Prescriptions   None    PDMP not reviewed this encounter.   Valinda Hoar, NP 05/31/22 1757

## 2022-05-31 NOTE — ED Triage Notes (Addendum)
Right leg has been painful to stand on this past week.  Patient has numbness in right leg.  [facility] reports [facility], but wont tell her why.  Numbness from hip to toes.  Patient can move right leg and toes.  Patient has 2 + pedal pulses in right foot, brisk cap refill.  Foot is equally warm as left foot all toes are cool

## 2022-05-31 NOTE — Discharge Instructions (Signed)
Your pain is most likely caused by irritation to the muscles .  Starting tomorrow take prednisone every morning with food for the next 5 days, this medicine reduces inflammation which in turn will help with your pain  You may use muscle relaxer at bedtime for additional comfort and to help you sleep  You may use heating pad in 15 minute intervals as needed for additional comfort, or you may find comfort in using ice in 10-15 minutes over affected area  Begin stretching affected area daily for 10 minutes as tolerated to further loosen muscles   When lying down place pillow underneath and between knees for support  Can try sleeping without pillow on firm mattress   Practice good posture: head back, shoulders back, chest forward, pelvis back and weight distributed evenly on both legs  If pain persist after recommended treatment or reoccurs if may be beneficial to follow up with orthopedic specialist for evaluation, this doctor specializes in the bones and can manage your symptoms long-term with options such as but not limited to imaging, medications or physical therapy

## 2023-08-04 ENCOUNTER — Other Ambulatory Visit: Payer: Self-pay

## 2023-08-04 ENCOUNTER — Emergency Department (HOSPITAL_BASED_OUTPATIENT_CLINIC_OR_DEPARTMENT_OTHER)
Admission: EM | Admit: 2023-08-04 | Discharge: 2023-08-04 | Disposition: A | Discharge status: Home / Self Care | Payer: No Typology Code available for payment source | Attending: Emergency Medicine | Admitting: Emergency Medicine

## 2023-08-04 ENCOUNTER — Encounter (HOSPITAL_BASED_OUTPATIENT_CLINIC_OR_DEPARTMENT_OTHER): Payer: Self-pay

## 2023-08-04 DIAGNOSIS — I10 Essential (primary) hypertension: Secondary | ICD-10-CM | POA: Diagnosis not present

## 2023-08-04 DIAGNOSIS — Z91040 Latex allergy status: Secondary | ICD-10-CM | POA: Insufficient documentation

## 2023-08-04 DIAGNOSIS — M79605 Pain in left leg: Secondary | ICD-10-CM | POA: Diagnosis present

## 2023-08-04 DIAGNOSIS — Z91010 Allergy to peanuts: Secondary | ICD-10-CM | POA: Diagnosis not present

## 2023-08-04 DIAGNOSIS — M545 Low back pain, unspecified: Secondary | ICD-10-CM | POA: Diagnosis not present

## 2023-08-04 LAB — CBC WITH DIFFERENTIAL/PLATELET
Abs Immature Granulocytes: 0.03 10*3/uL (ref 0.00–0.07)
Basophils Absolute: 0.1 10*3/uL (ref 0.0–0.1)
Basophils Relative: 1 %
Eosinophils Absolute: 0.1 10*3/uL (ref 0.0–0.5)
Eosinophils Relative: 1 %
HCT: 43.4 % (ref 36.0–46.0)
Hemoglobin: 15 g/dL (ref 12.0–15.0)
Immature Granulocytes: 0 %
Lymphocytes Relative: 31 %
Lymphs Abs: 2.7 10*3/uL (ref 0.7–4.0)
MCH: 30.9 pg (ref 26.0–34.0)
MCHC: 34.6 g/dL (ref 30.0–36.0)
MCV: 89.5 fL (ref 80.0–100.0)
Monocytes Absolute: 0.9 10*3/uL (ref 0.1–1.0)
Monocytes Relative: 10 %
Neutro Abs: 5 10*3/uL (ref 1.7–7.7)
Neutrophils Relative %: 57 %
Platelets: 217 10*3/uL (ref 150–400)
RBC: 4.85 MIL/uL (ref 3.87–5.11)
RDW: 14.5 % (ref 11.5–15.5)
WBC: 8.7 10*3/uL (ref 4.0–10.5)
nRBC: 0 % (ref 0.0–0.2)

## 2023-08-04 LAB — URINALYSIS, ROUTINE W REFLEX MICROSCOPIC
Glucose, UA: NEGATIVE mg/dL
Hgb urine dipstick: NEGATIVE
Ketones, ur: >80 mg/dL — AB
Leukocytes,Ua: NEGATIVE
Nitrite: NEGATIVE
Protein, ur: 30 mg/dL — AB
Specific Gravity, Urine: 1.038 — ABNORMAL HIGH (ref 1.005–1.030)
pH: 6.5 (ref 5.0–8.0)

## 2023-08-04 LAB — RAPID URINE DRUG SCREEN, HOSP PERFORMED
Amphetamines: NOT DETECTED
Barbiturates: NOT DETECTED
Benzodiazepines: NOT DETECTED
Cocaine: NOT DETECTED
Opiates: NOT DETECTED
Tetrahydrocannabinol: POSITIVE — AB

## 2023-08-04 LAB — COMPREHENSIVE METABOLIC PANEL
ALT: 9 U/L (ref 0–44)
AST: 12 U/L — ABNORMAL LOW (ref 15–41)
Albumin: 5 g/dL (ref 3.5–5.0)
Alkaline Phosphatase: 57 U/L (ref 38–126)
Anion gap: 15 (ref 5–15)
BUN: 13 mg/dL (ref 6–20)
CO2: 19 mmol/L — ABNORMAL LOW (ref 22–32)
Calcium: 10.9 mg/dL — ABNORMAL HIGH (ref 8.9–10.3)
Chloride: 108 mmol/L (ref 98–111)
Creatinine, Ser: 0.83 mg/dL (ref 0.44–1.00)
GFR, Estimated: >60 mL/min (ref >60)
Glucose, Bld: 82 mg/dL (ref 70–99)
Potassium: 3.3 mmol/L — ABNORMAL LOW (ref 3.5–5.1)
Sodium: 142 mmol/L (ref 135–145)
Total Bilirubin: 0.9 mg/dL (ref 0.3–1.2)
Total Protein: 7.4 g/dL (ref 6.5–8.1)

## 2023-08-04 MED ORDER — LIDOCAINE 4 % EX PTCH: 1.0000 | MEDICATED_PATCH | CUTANEOUS | 0 refills | Status: AC

## 2023-08-04 MED ORDER — OXYCODONE HCL 5 MG PO TABS
5.0000 mg | ORAL_TABLET | ORAL | 0 refills | Status: AC | PRN
Start: 2023-08-04 — End: ?

## 2023-08-04 MED ORDER — LIDOCAINE 5 % EX PTCH
1.0000 | MEDICATED_PATCH | CUTANEOUS | Status: DC
  Administered 2023-08-04: 1 via TRANSDERMAL
  Filled 2023-08-04: qty 1

## 2023-08-04 MED ORDER — KETOROLAC TROMETHAMINE 10 MG PO TABS: 10.0000 mg | ORAL_TABLET | Freq: Four times a day (QID) | ORAL | 0 refills | Status: AC | PRN

## 2023-08-04 MED ORDER — OXYCODONE HCL 5 MG PO TABS
5.0000 mg | ORAL_TABLET | Freq: Once | ORAL | Status: AC
  Administered 2023-08-04: 5 mg via ORAL
  Filled 2023-08-04: qty 1

## 2023-08-04 MED ORDER — KETOROLAC TROMETHAMINE 30 MG/ML IJ SOLN
30.0000 mg | Freq: Once | INTRAMUSCULAR | Status: AC
  Administered 2023-08-04: 30 mg via INTRAVENOUS
  Filled 2023-08-04: qty 1

## 2023-08-04 NOTE — ED Notes (Signed)
Pt ambulatory to restroom at this time with assistance by this RN with unsteady gait, however gait improved since arrival per pt after meds

## 2023-08-04 NOTE — ED Notes (Signed)
 RN reviewed discharge instructions with pt. Pt verbalized understanding and had no further questions. VSS upon discharge.  

## 2023-08-04 NOTE — ED Triage Notes (Signed)
Pt has had tingling to left leg for one week then this morning at 0100 her left leg went numb from the knee down. No other numbness or tingling going on at this time. Pt A&OX4. No other neuro symptoms noted.

## 2023-08-04 NOTE — Discharge Instructions (Addendum)
Please take your medications as prescribed. I recommend close follow-up with neurosurgery or PCP for reevaluation.  Please do not hesitate to return to emergency department if worrisome signs symptoms we discussed become apparent.

## 2023-08-04 NOTE — ED Provider Notes (Signed)
[place] [facility] AT [facility] Provider Note   CSN: [id number] Arrival date & time: 08/04/23  1910     History {Add pertinent medical, surgical, social history, OB history to HPI:1} Chief Complaint  Patient presents with   Numbness    Danielle Estes is a 42 y.o. female past ministry of hypertension, anemia, GERD presents today for evaluation of back pain and leg pain.  Patient states that she has had low back pain for the last 7 days.  Pain is located in her left lower back, radiating down to her left leg.  States that today around 1:00 she developed numbness from her knee down to her foot.  Denies any numbness anywhere else.  Denies any urinary/bowel incontinence, saddle anesthesia or distal weakness.  Denies any leg swelling.  HPI  Past Medical History:  Diagnosis Date   Anemia    Angio-edema    Bipolar 1 disorder (HCC)    Depression    Eczema    GERD (gastroesophageal reflux disease)    Headache    Hypertension    Multiple allergies    Pulmonary embolism (HCC)    Urticaria    Vaginal Pap smear, abnormal    Past Surgical History:  Procedure Laterality Date   ABDOMINAL HYSTERECTOMY     BREAST SURGERY     had lumped removed at age 31. non cancer   CESAREAN SECTION     C/S x 1   CESAREAN SECTION N/A 04/19/2015   Procedure: CESAREAN SECTION;  Surgeon: Adam Phenix, MD;  Location: [facility];  Service: Obstetrics;  Laterality: N/A;   TUBAL LIGATION Bilateral 2016     Home Medications Prior to Admission medications   Medication Sig Start Date End Date Taking? Authorizing Provider  antiseptic oral rinse (BIOTENE) LIQD 15 mLs by Mouth Rinse route as needed for dry mouth. Patient not taking: Reported on 06/03/2021 06/02/17   Denzil Magnuson, NP  asenapine (SAPHRIS) 5 MG SUBL 24 hr tablet Place 1 tablet (5 mg total) under the tongue 2 (two) times daily as needed (SEVERE ANXIETY/AGITATION). Patient not taking: Reported on 06/03/2021 06/02/17   Denzil Magnuson, NP  cyclobenzaprine (FLEXERIL) 10 MG tablet Take 1 tablet (10 mg total) by mouth at bedtime. 05/31/22   Valinda Hoar, NP  famotidine (PEPCID) 20 MG tablet Take 1 tablet 1-2 times per day as directed by hives regimen. 06/03/21   Hetty Blend, FNP  fluticasone (FLONASE) 50 MCG/ACT nasal spray Place 2 sprays into both nostrils daily as needed for allergies or rhinitis. 06/03/21   Hetty Blend, FNP  levocetirizine (XYZAL) 5 MG tablet Take 1 tablet (5 mg total) by mouth every evening. 06/03/21   Ambs, Norvel Richards, FNP  mometasone (ELOCON) 0.1 % ointment Apply topically daily. Apply to red itchy areas below your face once a day as needed. DO NOT USE this medication for longer than 2 weeks 06/03/21   Ambs, Norvel Richards, FNP  olopatadine (PATANOL) 0.1 % ophthalmic solution Place 1 drop into both eyes 2 (two) times daily as needed for allergies. Patient not taking: Reported on 05/31/2022 06/04/21   Hetty Blend, FNP  prazosin (MINIPRESS) 1 MG capsule Take 1 capsule (1 mg total) by mouth at bedtime. For nightmares Patient not taking: Reported on 06/03/2021 06/02/17   Denzil Magnuson, NP  predniSONE (DELTASONE) 20 MG tablet Take 2 tablets (40 mg total) by mouth daily. 05/31/22   Valinda Hoar, NP  triamcinolone ointment (KENALOG) 0.1 %  Apply 1 application topically 2 (two) times daily. Patient not taking: Reported on 06/03/2021 09/29/20   Alfonse Spruce, MD      Allergies    Garlic, Ibuprofen, Latex, Peanut-containing drug products, Shellfish allergy, Vinegar [acetic acid], Aspirin, Banana, Benadryl [diphenhydramine], Chocolate, Codeine, Egg-derived products, Iodine, Penicillins, Strawberry extract, Tylenol [acetaminophen], Citrus, and Pineapple    Review of Systems   Review of Systems Negative except as per HPI.  Physical Exam Updated Vital Signs BP (!) 143/109 (BP Location: Right Arm)   Pulse 95   Temp 97.9 F (36.6 C)   Resp 18   Ht 6' (1.829 m)   Wt 103.7 kg   LMP 06/22/2017   SpO2 100%    BMI 31.01 kg/m  Physical Exam Vitals and nursing note reviewed.  Constitutional:      Appearance: Normal appearance.  HENT:     Head: Normocephalic and atraumatic.     Mouth/Throat:     Mouth: Mucous membranes are moist.  Eyes:     General: No scleral icterus. Cardiovascular:     Rate and Rhythm: Normal rate and regular rhythm.     Pulses: Normal pulses.     Heart sounds: Normal heart sounds.  Pulmonary:     Effort: Pulmonary effort is normal.     Breath sounds: Normal breath sounds.  Abdominal:     General: Abdomen is flat.     Palpations: Abdomen is soft.     Tenderness: There is no abdominal tenderness.  Musculoskeletal:        General: No deformity.     Comments: Tenderness to palpation to paraspinal muscles of the lumbar spine and left thigh.  Decreased sensation to light touch to the left foot.  Skin:    General: Skin is warm.     Findings: No rash.  Neurological:     General: No focal deficit present.     Mental Status: She is alert.  Psychiatric:        Mood and Affect: Mood normal.     ED Results / Procedures / Treatments   Labs (all labs ordered are listed, but only abnormal results are displayed) Labs Reviewed  RAPID URINE DRUG SCREEN, HOSP PERFORMED - Abnormal; Notable for the following components:      Result Value   Tetrahydrocannabinol POSITIVE (*)    All other components within normal limits  URINALYSIS, ROUTINE W REFLEX MICROSCOPIC - Abnormal; Notable for the following components:   APPearance HAZY (*)    Specific Gravity, Urine 1.038 (*)    Bilirubin Urine SMALL (*)    Ketones, ur >80 (*)    Protein, ur 30 (*)    Bacteria, UA RARE (*)    All other components within normal limits  COMPREHENSIVE METABOLIC PANEL - Abnormal; Notable for the following components:   Potassium 3.3 (*)    CO2 19 (*)    Calcium 10.9 (*)    AST 12 (*)    All other components within normal limits  CBC WITH DIFFERENTIAL/PLATELET    EKG None  Radiology No  results found.  Procedures Procedures  {Document cardiac monitor, telemetry assessment procedure when appropriate:1}  Medications Ordered in ED Medications  lidocaine (LIDODERM) 5 % 1 patch (1 patch Transdermal Patch Applied 08/04/23 2208)  ketorolac (TORADOL) 30 MG/ML injection 30 mg (30 mg Intravenous Given 08/04/23 2213)  oxyCODONE (Oxy IR/ROXICODONE) immediate release tablet 5 mg (5 mg Oral Given 08/04/23 2207)    ED Course/ Medical Decision Making/ A&P   {  Click here for ABCD2, HEART and other calculatorsREFRESH Note before signing :1}                              Medical Decision Making Amount and/or Complexity of Data Reviewed Labs: ordered.  Risk Prescription drug management.   ***  {Document critical care time when appropriate:1} {Document review of labs and clinical decision tools ie heart score, Chads2Vasc2 etc:1}  {Document your independent review of radiology images, and any outside records:1} {Document your discussion with family members, caretakers, and with consultants:1} {Document social determinants of health affecting pt's care:1} {Document your decision making why or why not admission, treatments were needed:1} Final Clinical Impression(s) / ED Diagnoses Final diagnoses:  None    Rx / DC Orders ED Discharge Orders     None

## 2023-08-06 ENCOUNTER — Encounter (HOSPITAL_BASED_OUTPATIENT_CLINIC_OR_DEPARTMENT_OTHER): Payer: Self-pay | Admitting: Emergency Medicine

## 2023-08-06 ENCOUNTER — Emergency Department (HOSPITAL_BASED_OUTPATIENT_CLINIC_OR_DEPARTMENT_OTHER)
Admission: EM | Admit: 2023-08-06 | Discharge: 2023-08-06 | Disposition: A | Discharge status: Home / Self Care | Payer: No Typology Code available for payment source | Attending: Emergency Medicine | Admitting: Emergency Medicine

## 2023-08-06 DIAGNOSIS — R1013 Epigastric pain: Secondary | ICD-10-CM | POA: Diagnosis not present

## 2023-08-06 DIAGNOSIS — R112 Nausea with vomiting, unspecified: Secondary | ICD-10-CM

## 2023-08-06 DIAGNOSIS — Z91010 Allergy to peanuts: Secondary | ICD-10-CM | POA: Insufficient documentation

## 2023-08-06 DIAGNOSIS — E876 Hypokalemia: Secondary | ICD-10-CM

## 2023-08-06 DIAGNOSIS — E860 Dehydration: Secondary | ICD-10-CM | POA: Diagnosis not present

## 2023-08-06 DIAGNOSIS — F419 Anxiety disorder, unspecified: Secondary | ICD-10-CM | POA: Diagnosis not present

## 2023-08-06 DIAGNOSIS — I10 Essential (primary) hypertension: Secondary | ICD-10-CM | POA: Diagnosis not present

## 2023-08-06 DIAGNOSIS — Z91040 Latex allergy status: Secondary | ICD-10-CM | POA: Diagnosis not present

## 2023-08-06 LAB — COMPREHENSIVE METABOLIC PANEL
ALT: 7 U/L (ref 0–44)
AST: 12 U/L — ABNORMAL LOW (ref 15–41)
Albumin: 4.8 g/dL (ref 3.5–5.0)
Alkaline Phosphatase: 64 U/L (ref 38–126)
Anion gap: 15 (ref 5–15)
BUN: 17 mg/dL (ref 6–20)
CO2: 19 mmol/L — ABNORMAL LOW (ref 22–32)
Calcium: 9.7 mg/dL (ref 8.9–10.3)
Chloride: 106 mmol/L (ref 98–111)
Creatinine, Ser: 0.71 mg/dL (ref 0.44–1.00)
GFR, Estimated: >60 mL/min (ref >60)
Glucose, Bld: 83 mg/dL (ref 70–99)
Potassium: 3.4 mmol/L — ABNORMAL LOW (ref 3.5–5.1)
Sodium: 140 mmol/L (ref 135–145)
Total Bilirubin: 0.9 mg/dL (ref 0.3–1.2)
Total Protein: 7.4 g/dL (ref 6.5–8.1)

## 2023-08-06 LAB — CBC WITH DIFFERENTIAL/PLATELET
Abs Immature Granulocytes: 0.01 10*3/uL (ref 0.00–0.07)
Basophils Absolute: 0 10*3/uL (ref 0.0–0.1)
Basophils Relative: 1 %
Eosinophils Absolute: 0 10*3/uL (ref 0.0–0.5)
Eosinophils Relative: 0 %
HCT: 47 % — ABNORMAL HIGH (ref 36.0–46.0)
Hemoglobin: 15.9 g/dL — ABNORMAL HIGH (ref 12.0–15.0)
Immature Granulocytes: 0 %
Lymphocytes Relative: 17 %
Lymphs Abs: 1.1 10*3/uL (ref 0.7–4.0)
MCH: 31.4 pg (ref 26.0–34.0)
MCHC: 33.8 g/dL (ref 30.0–36.0)
MCV: 92.7 fL (ref 80.0–100.0)
Monocytes Absolute: 0.4 10*3/uL (ref 0.1–1.0)
Monocytes Relative: 6 %
Neutro Abs: 4.8 10*3/uL (ref 1.7–7.7)
Neutrophils Relative %: 76 %
Platelets: 193 10*3/uL (ref 150–400)
RBC: 5.07 MIL/uL (ref 3.87–5.11)
RDW: 14.4 % (ref 11.5–15.5)
WBC: 6.4 10*3/uL (ref 4.0–10.5)
nRBC: 0 % (ref 0.0–0.2)

## 2023-08-06 LAB — URINALYSIS, ROUTINE W REFLEX MICROSCOPIC
Bacteria, UA: NONE SEEN
Bilirubin Urine: NEGATIVE
Glucose, UA: NEGATIVE mg/dL
Hgb urine dipstick: NEGATIVE
Ketones, ur: >80 mg/dL — AB
Leukocytes,Ua: NEGATIVE
Nitrite: NEGATIVE
Protein, ur: 30 mg/dL — AB
Specific Gravity, Urine: 1.031 — ABNORMAL HIGH (ref 1.005–1.030)
pH: 6 (ref 5.0–8.0)

## 2023-08-06 LAB — LIPASE, BLOOD: Lipase: <10 U/L — ABNORMAL LOW (ref 11–51)

## 2023-08-06 MED ORDER — POTASSIUM CHLORIDE CRYS ER 20 MEQ PO TBCR
40.0000 meq | EXTENDED_RELEASE_TABLET | Freq: Once | ORAL | Status: AC
  Administered 2023-08-06: 40 meq via ORAL
  Filled 2023-08-06: qty 2

## 2023-08-06 MED ORDER — ONDANSETRON HCL 4 MG PO TABS: 4.0000 mg | ORAL_TABLET | Freq: Three times a day (TID) | ORAL | 0 refills | Status: AC | PRN

## 2023-08-06 MED ORDER — DROPERIDOL 2.5 MG/ML IJ SOLN
2.5000 mg | Freq: Once | INTRAMUSCULAR | Status: AC
  Administered 2023-08-06: 2.5 mg via INTRAVENOUS
  Filled 2023-08-06: qty 2

## 2023-08-06 MED ORDER — ONDANSETRON HCL 4 MG PO TABS: 4.0000 mg | ORAL_TABLET | Freq: Three times a day (TID) | ORAL | 0 refills | Status: DC | PRN

## 2023-08-06 MED ORDER — SODIUM CHLORIDE 0.9 % IV BOLUS
1000.0000 mL | Freq: Once | INTRAVENOUS | Status: AC
  Administered 2023-08-06: 1000 mL via INTRAVENOUS

## 2023-08-06 NOTE — ED Triage Notes (Signed)
Emesis and "going to pass out" started last night Thinks it is a reaction to Toradol script given 2 days ago.

## 2023-08-06 NOTE — ED Provider Notes (Signed)
[place] [facility] AT [facility] Provider Note   CSN: [id number] Arrival date & time: 08/06/23  1159     History  Chief Complaint  Patient presents with   Emesis    Danielle Estes is a 42 y.o. female with past medical history Estes, Danielle Estes, Danielle Estes, Danielle Estes that the nausea has become so bad that she feels lightheaded.  No hematemesis.  Generalized abdominal pain worse in the epigastric region.  Says that this started after being given a prescription in the ED for p.o. Toradol to treat sciatica.  She was also given oxycodone but has not been taking this.  Estes that overall back pain is doing well and improving.  Previous abdominal surgeries include hysterectomy and C-section.  No fever, chest pain, shortness of breath, urinary symptoms.  Also endorses daily marijuana use though no change in this recently.      Home Medications Prior to Admission medications   Medication Sig Start Date End Date Taking? Authorizing Provider  antiseptic oral rinse (BIOTENE) LIQD 15 mLs by Mouth Rinse route as needed for dry mouth. Patient not taking: Reported on 06/03/2021 06/02/17   Denzil Magnuson, NP  asenapine (SAPHRIS) 5 MG SUBL 24 hr tablet Place 1 tablet (5 mg total) under the tongue 2 (two) times daily as needed (SEVERE ANXIETY/AGITATION). Patient not taking: Reported on 06/03/2021 06/02/17   Denzil Magnuson, NP  cyclobenzaprine (FLEXERIL) 10 MG tablet Take 1 tablet (10 mg total) by mouth at bedtime. 05/31/22   Valinda Hoar, NP  famotidine (PEPCID) 20 MG tablet Take 1 tablet 1-2 times per day as directed by hives regimen. 06/03/21   Hetty Blend, FNP  fluticasone (FLONASE) 50 MCG/ACT nasal spray Place 2 sprays into both nostrils daily as needed for allergies or rhinitis. 06/03/21   Hetty Blend, FNP  ketorolac (TORADOL) 10 MG tablet Take 1 tablet (10 mg total) by  mouth every 6 (six) hours as needed. 08/04/23   Jeanelle Malling, PA  levocetirizine (XYZAL) 5 MG tablet Take 1 tablet (5 mg total) by mouth every evening. 06/03/21   Ambs, Norvel Richards, FNP  lidocaine (HM LIDOCAINE PATCH) 4 % Place 1 patch onto the skin daily. 08/04/23   Jeanelle Malling, PA  mometasone (ELOCON) 0.1 % ointment Apply topically daily. Apply to red itchy areas below your face once a day as needed. DO NOT USE this medication for longer than 2 weeks 06/03/21   Ambs, Norvel Richards, FNP  olopatadine (PATANOL) 0.1 % ophthalmic solution Place 1 drop into both eyes 2 (two) times daily as needed for allergies. Patient not taking: Reported on 05/31/2022 06/04/21   Hetty Blend, FNP  ondansetron (ZOFRAN) 4 MG tablet Take 1 tablet (4 mg total) by mouth every 8 (eight) hours as needed for up to 3 days for nausea or vomiting. 08/06/23 08/09/23  Hanne Kegg, Dow Adolph L, PA-C  oxyCODONE (ROXICODONE) 5 MG immediate release tablet Take 1 tablet (5 mg total) by mouth every 4 (four) hours as needed for up to 8 doses for severe pain. 08/04/23   Jeanelle Malling, PA  prazosin (MINIPRESS) 1 MG capsule Take 1 capsule (1 mg total) by mouth at bedtime. For nightmares Patient not taking: Reported on 06/03/2021 06/02/17   Denzil Magnuson, NP  predniSONE (DELTASONE) 20 MG tablet Take 2 tablets (40 mg total) by mouth daily. 05/31/22   Valinda Hoar, NP  triamcinolone ointment (KENALOG) 0.1 % Apply 1 application topically 2 (two) times daily. Patient not taking: Reported on 06/03/2021 09/29/20   Alfonse Spruce, MD      Allergies    Garlic, Ibuprofen, Latex, Peanut-containing drug products, Shellfish allergy, Vinegar [acetic acid], Aspirin, Banana, Benadryl [diphenhydramine], Chocolate, Codeine, Egg-derived products, Iodine, Penicillins, Strawberry extract, Tylenol [acetaminophen], Citrus, and Pineapple    Review of Systems   Review of Systems  All other systems reviewed and are negative.   Physical Exam Updated Vital Signs BP (!) 160/88 (BP Location:  Right Arm)   Pulse 80   Temp 98.2 F (36.8 C) (Oral)   Resp 18   LMP 06/22/2017   SpO2 100%  Physical Exam Vitals and nursing note reviewed.  Constitutional:      General: She is in acute distress (Mild secondary to nausea and pain).     Appearance: Normal appearance. She is not toxic-appearing or diaphoretic.  HENT:     Head: Normocephalic and atraumatic.     Mouth/Throat:     Mouth: Mucous membranes are dry.  Eyes:     Conjunctiva/sclera: Conjunctivae normal.  Cardiovascular:     Rate and Rhythm: Normal rate and regular rhythm.     Heart sounds: No murmur heard. Pulmonary:     Effort: Pulmonary effort is normal.     Breath sounds: Normal breath sounds.  Abdominal:     General: Abdomen is flat. There is no distension.     Palpations: Abdomen is soft.     Tenderness: There is abdominal tenderness (generalized). There is no right CVA tenderness, left CVA tenderness, guarding or rebound.  Musculoskeletal:        General: Normal range of motion.     Cervical back: Neck supple.     Right lower leg: No edema.     Left lower leg: No edema.  Skin:    General: Skin is warm and dry.     Capillary Refill: Capillary refill takes less than 2 seconds.  Neurological:     Mental Status: She is alert. Mental status is at baseline.  Psychiatric:        Mood and Affect: Mood is anxious.        Behavior: Behavior is cooperative.     ED Results / Procedures / Treatments   Labs (all labs ordered are listed, but only abnormal results are displayed) Labs Reviewed  CBC WITH DIFFERENTIAL/PLATELET - Abnormal; Notable for the following components:      Result Value   Hemoglobin 15.9 (*)    HCT 47.0 (*)    All other components within normal limits  COMPREHENSIVE METABOLIC PANEL - Abnormal; Notable for the following components:   Potassium 3.4 (*)    CO2 19 (*)    AST 12 (*)    All other components within normal limits  LIPASE, BLOOD - Abnormal; Notable for the following components:    Lipase <10 (*)    All other components within normal limits  URINALYSIS, ROUTINE W REFLEX MICROSCOPIC - Abnormal; Notable for the following components:   Specific Gravity, Urine 1.031 (*)    Ketones, ur >80 (*)    Protein, ur 30 (*)    All other components within normal limits    EKG None  Radiology No results found.  Procedures Procedures    Medications Ordered in ED Medications  droperidol (INAPSINE) 2.5 MG/ML injection 2.5 mg (2.5 mg Intravenous Given 08/06/23 1342)  sodium chloride 0.9 % bolus 1,000 mL ( Intravenous  Stopped 08/06/23 1445)  potassium chloride SA (KLOR-CON M) CR tablet 40 mEq (40 mEq Oral Given 08/06/23 1549)    ED Course/ Medical Decision Making/ A&P                                 Medical Decision Making Amount and/or Complexity of Data Reviewed Labs: ordered. Decision-making details documented in ED Course. ECG/medicine tests: ordered. Decision-making details documented in ED Course.  Risk Prescription drug management.   Medical Decision Making:   Santita S Hymes is a 42 y.o. female who presented to the ED today with abdominal pain detailed above.    Additional history discussed with patient's family/caregivers.  Patient's presentation is complicated by their history of recent Toradol use, marijuana use.  Complete initial physical exam performed, notably the patient  was nontoxic-appearing but was anxious.  Generalized abdominal tenderness without rebound, guarding, or peritoneal signs.  Dry mucous membranes.    Reviewed and confirmed nursing documentation for past medical history, family history, social history.    Initial Assessment:   With the patient's presentation of abdominal pain, differential diagnosis includes but is not limited to AAA, mesenteric ischemia, appendicitis, diverticulitis, DKA, gastritis, gastroenteritis, AMI, nephrolithiasis, pancreatitis, peritonitis, adrenal insufficiency, intestinal ischemia, constipation, UTI, SBO/LBO,  splenic rupture, biliary disease, IBD, IBS, PUD, hepatitis, STD, ovarian/testicular torsion, electrolyte disturbance, DKA, dehydration, acute kidney injury, renal failure, cholecystitis, cholelithiasis, choledocholithiasis, abdominal pain of  unknown etiology.   Initial Plan:  Screening labs including CBC and Metabolic panel to evaluate for infectious or metabolic etiology of disease.  Lipase to evaluate for pancreatitis Urinalysis with reflex culture ordered to evaluate for UTI or relevant urologic/nephrologic pathology.  EKG to evaluate for cardiac pathology Symptomatic management Objective evaluation as reviewed   Initial Study Results:   Laboratory  All laboratory results reviewed without evidence of clinically relevant pathology.   Exceptions include: Potassium 3.4  EKG EKG was reviewed independently.  Sinus rhythm, no acute ST-T changes, no STEMI.  Final Assessment and Plan:   42 year old female presents to the ED with uncontrollable nausea.  Afebrile, nontoxic-appearing.  Notes that this started after recent medications that were initiated for sciatica.  Lower back pain and paresthesias have resolved.  Abdomen soft and nontender.  Does also endorse frequent use of marijuana.  Recent medication included Toradol.  Estes that she had none of the symptoms until she started this medication and believes it was a trigger.  Given recent NSAID use as well as in the setting of heavy THC use, question medication reaction versus hyperemesis.  Will trial dose of droperidol and obtain baseline labs.  Mild hypokalemia.  Normal kidney function, normal liver enzymes.  Urine does not appear acutely infected.  Following droperidol, patient tolerating p.o., asymptomatic, feeling well.  Serial exams reveal abdomen that remains soft and nontender.  Low suspicion for acute intra-abdominal process.  Given that patient's symptoms rapidly improved in the setting of abnormal labs, will hold on imaging at this time  but patient given very strict ED return precautions for any worsening symptoms including but not limited to return of abdominal pain and is agreeable to return if needed.  Will treat with Zofran for nausea at home.  Encouraged cessation of marijuana as well as discontinuation of Toradol as sciatic symptoms have improved.  Patient expressed understanding of plan.  All questions answered and stable for discharge.   Clinical Impression:  1. Nausea and vomiting, unspecified vomiting type  2. Dehydration   3. Hypokalemia      Discharge           Final Clinical Impression(s) / ED Diagnoses Final diagnoses:  Nausea and vomiting, unspecified vomiting type  Dehydration  Hypokalemia    Rx / DC Orders ED Discharge Orders          Ordered    ondansetron (ZOFRAN) 4 MG tablet  Every 8 hours PRN,   Status:  Discontinued        08/06/23 1605    ondansetron (ZOFRAN) 4 MG tablet  Every 8 hours PRN        08/06/23 1617              Tonette Lederer, PA-C 08/06/23 1727    Ernie Avena, MD 08/07/23 [phone]

## 2023-08-06 NOTE — Discharge Instructions (Addendum)
Thank you for letting us take care of you today.  Overall, your labs are reassuring.  Your urine did not appear infected.  You had a very minimal depletion in your potassium level.  We gave you a pill to fix this.  We gave you medication through the IV to treat your nausea as well as IV fluids to treat dehydration.  As discussed, I recommend stopping the Toradol prescribed at your previous visit as this can be irritating to the stomach and cause nausea as a side effect.  I also recommend stopping any use of marijuana as this can lead to issues with recurrent nausea and vomiting.  I provided 2 primary care clinics you may follow up with. If you continue to have issues, they can help you long-term should you need daily medications or to see a specialist.  If you develop any worsening symptoms such as fever, severe abdominal pain, uncontrollable vomiting, or other new, concerning symptoms, return to the nearest ED for reevaluation.
